# Patient Record
Sex: Female | Born: 1945 | Race: White | Hispanic: No | Marital: Married | Smoking: Never smoker
Health system: Southern US, Community
[De-identification: ages and names within clinical notes are randomized; demographics above are authoritative.]

## PROBLEM LIST (undated history)

## (undated) DIAGNOSIS — J309 Allergic rhinitis, unspecified: Secondary | ICD-10-CM

## (undated) DIAGNOSIS — IMO0001 Reserved for inherently not codable concepts without codable children: Secondary | ICD-10-CM

## (undated) DIAGNOSIS — J4489 Other specified chronic obstructive pulmonary disease: Secondary | ICD-10-CM

## (undated) DIAGNOSIS — I2510 Atherosclerotic heart disease of native coronary artery without angina pectoris: Secondary | ICD-10-CM

## (undated) DIAGNOSIS — G589 Mononeuropathy, unspecified: Secondary | ICD-10-CM

## (undated) DIAGNOSIS — J449 Chronic obstructive pulmonary disease, unspecified: Secondary | ICD-10-CM

## (undated) DIAGNOSIS — H409 Unspecified glaucoma: Secondary | ICD-10-CM

## (undated) DIAGNOSIS — G479 Sleep disorder, unspecified: Secondary | ICD-10-CM

## (undated) DIAGNOSIS — I739 Peripheral vascular disease, unspecified: Secondary | ICD-10-CM

## (undated) DIAGNOSIS — I059 Rheumatic mitral valve disease, unspecified: Secondary | ICD-10-CM

## (undated) HISTORY — DX: Mononeuropathy, unspecified: G58.9

## (undated) HISTORY — DX: Allergic rhinitis, unspecified: J30.9

## (undated) HISTORY — PX: ROTATOR CUFF REPAIR: SHX139

## (undated) HISTORY — DX: Peripheral vascular disease, unspecified: I73.9

## (undated) HISTORY — DX: Unspecified glaucoma: H40.9

## (undated) HISTORY — DX: Rheumatic mitral valve disease, unspecified: I05.9

## (undated) HISTORY — DX: Reserved for inherently not codable concepts without codable children: IMO0001

## (undated) HISTORY — DX: Other specified chronic obstructive pulmonary disease: J44.89

## (undated) HISTORY — DX: Sleep disorder, unspecified: G47.9

## (undated) HISTORY — DX: Chronic obstructive pulmonary disease, unspecified: J44.9

## (undated) HISTORY — DX: Atherosclerotic heart disease of native coronary artery without angina pectoris: I25.10

---

## 2000-04-17 ENCOUNTER — Encounter: Payer: Self-pay | Admitting: Orthopaedic Surgery

## 2000-04-20 ENCOUNTER — Inpatient Hospital Stay (HOSPITAL_COMMUNITY): Admission: RE | Admit: 2000-04-20 | Discharge: 2000-04-22 | Payer: Self-pay | Admitting: Orthopaedic Surgery

## 2004-03-15 ENCOUNTER — Encounter: Admission: RE | Admit: 2004-03-15 | Discharge: 2004-03-15 | Payer: Self-pay | Admitting: Orthopaedic Surgery

## 2004-07-02 ENCOUNTER — Ambulatory Visit: Payer: Self-pay | Admitting: Family Medicine

## 2004-07-27 ENCOUNTER — Ambulatory Visit: Payer: Self-pay | Admitting: Family Medicine

## 2004-10-13 ENCOUNTER — Ambulatory Visit: Payer: Self-pay | Admitting: Internal Medicine

## 2004-11-01 ENCOUNTER — Ambulatory Visit: Payer: Self-pay | Admitting: Internal Medicine

## 2004-11-01 ENCOUNTER — Ambulatory Visit (HOSPITAL_BASED_OUTPATIENT_CLINIC_OR_DEPARTMENT_OTHER): Admission: RE | Admit: 2004-11-01 | Discharge: 2004-11-01 | Payer: Self-pay | Admitting: Internal Medicine

## 2004-11-07 ENCOUNTER — Emergency Department (HOSPITAL_COMMUNITY): Admission: EM | Admit: 2004-11-07 | Discharge: 2004-11-07 | Payer: Self-pay | Admitting: *Deleted

## 2004-11-15 ENCOUNTER — Encounter: Payer: Self-pay | Admitting: Internal Medicine

## 2004-11-17 ENCOUNTER — Ambulatory Visit: Payer: Self-pay | Admitting: Internal Medicine

## 2004-12-16 ENCOUNTER — Ambulatory Visit: Payer: Self-pay | Admitting: Internal Medicine

## 2004-12-24 ENCOUNTER — Ambulatory Visit: Payer: Self-pay | Admitting: Family Medicine

## 2005-03-16 ENCOUNTER — Ambulatory Visit: Payer: Self-pay | Admitting: Internal Medicine

## 2005-04-08 ENCOUNTER — Ambulatory Visit: Payer: Self-pay | Admitting: Internal Medicine

## 2005-05-11 ENCOUNTER — Ambulatory Visit: Payer: Self-pay | Admitting: Internal Medicine

## 2005-05-12 ENCOUNTER — Ambulatory Visit: Payer: Self-pay | Admitting: Family Medicine

## 2005-06-10 ENCOUNTER — Ambulatory Visit: Payer: Self-pay | Admitting: Internal Medicine

## 2005-06-17 ENCOUNTER — Ambulatory Visit: Payer: Self-pay | Admitting: Family Medicine

## 2005-07-06 ENCOUNTER — Ambulatory Visit: Payer: Self-pay | Admitting: Internal Medicine

## 2005-08-09 ENCOUNTER — Ambulatory Visit: Payer: Self-pay | Admitting: Internal Medicine

## 2005-09-09 ENCOUNTER — Ambulatory Visit: Payer: Self-pay | Admitting: Internal Medicine

## 2005-09-15 ENCOUNTER — Ambulatory Visit: Payer: Self-pay | Admitting: Family Medicine

## 2005-09-28 ENCOUNTER — Ambulatory Visit: Payer: Self-pay | Admitting: Family Medicine

## 2005-10-07 ENCOUNTER — Ambulatory Visit: Payer: Self-pay | Admitting: Internal Medicine

## 2005-10-20 ENCOUNTER — Ambulatory Visit: Payer: Self-pay | Admitting: Family Medicine

## 2005-11-08 ENCOUNTER — Ambulatory Visit: Payer: Self-pay | Admitting: Internal Medicine

## 2005-12-06 ENCOUNTER — Ambulatory Visit: Payer: Self-pay | Admitting: Internal Medicine

## 2006-01-03 ENCOUNTER — Ambulatory Visit: Payer: Self-pay | Admitting: Internal Medicine

## 2006-02-06 ENCOUNTER — Ambulatory Visit: Payer: Self-pay | Admitting: Internal Medicine

## 2006-03-08 ENCOUNTER — Ambulatory Visit: Payer: Self-pay | Admitting: Internal Medicine

## 2006-04-06 ENCOUNTER — Ambulatory Visit: Payer: Self-pay | Admitting: Internal Medicine

## 2006-05-08 ENCOUNTER — Ambulatory Visit: Payer: Self-pay | Admitting: Internal Medicine

## 2006-06-13 ENCOUNTER — Ambulatory Visit: Payer: Self-pay | Admitting: Internal Medicine

## 2006-07-11 ENCOUNTER — Ambulatory Visit: Payer: Self-pay | Admitting: Internal Medicine

## 2006-08-03 ENCOUNTER — Ambulatory Visit: Payer: Self-pay | Admitting: Internal Medicine

## 2006-09-05 ENCOUNTER — Ambulatory Visit: Payer: Self-pay | Admitting: Internal Medicine

## 2006-10-04 ENCOUNTER — Ambulatory Visit: Payer: Self-pay | Admitting: Internal Medicine

## 2006-11-03 ENCOUNTER — Ambulatory Visit: Payer: Self-pay | Admitting: Internal Medicine

## 2006-12-01 ENCOUNTER — Ambulatory Visit: Payer: Self-pay | Admitting: Internal Medicine

## 2007-01-03 ENCOUNTER — Ambulatory Visit: Payer: Self-pay | Admitting: Internal Medicine

## 2007-01-31 ENCOUNTER — Ambulatory Visit: Payer: Self-pay | Admitting: Internal Medicine

## 2007-03-01 ENCOUNTER — Ambulatory Visit: Payer: Self-pay | Admitting: Internal Medicine

## 2007-04-09 ENCOUNTER — Ambulatory Visit: Payer: Self-pay | Admitting: Internal Medicine

## 2007-05-14 ENCOUNTER — Ambulatory Visit: Payer: Self-pay | Admitting: Internal Medicine

## 2007-06-04 DIAGNOSIS — I059 Rheumatic mitral valve disease, unspecified: Secondary | ICD-10-CM | POA: Insufficient documentation

## 2007-06-04 DIAGNOSIS — R490 Dysphonia: Secondary | ICD-10-CM | POA: Insufficient documentation

## 2007-06-04 DIAGNOSIS — G589 Mononeuropathy, unspecified: Secondary | ICD-10-CM

## 2007-06-04 DIAGNOSIS — J302 Other seasonal allergic rhinitis: Secondary | ICD-10-CM

## 2007-06-04 DIAGNOSIS — J3089 Other allergic rhinitis: Secondary | ICD-10-CM | POA: Insufficient documentation

## 2007-06-04 DIAGNOSIS — G4733 Obstructive sleep apnea (adult) (pediatric): Secondary | ICD-10-CM | POA: Insufficient documentation

## 2007-06-04 DIAGNOSIS — H409 Unspecified glaucoma: Secondary | ICD-10-CM | POA: Insufficient documentation

## 2007-06-04 DIAGNOSIS — IMO0001 Reserved for inherently not codable concepts without codable children: Secondary | ICD-10-CM | POA: Insufficient documentation

## 2007-06-04 HISTORY — DX: Mononeuropathy, unspecified: G58.9

## 2007-06-04 HISTORY — DX: Other seasonal allergic rhinitis: J30.2

## 2007-06-04 HISTORY — DX: Rheumatic mitral valve disease, unspecified: I05.9

## 2007-06-04 HISTORY — DX: Unspecified glaucoma: H40.9

## 2007-06-04 HISTORY — DX: Dysphonia: R49.0

## 2007-06-13 ENCOUNTER — Ambulatory Visit: Payer: Self-pay | Admitting: Internal Medicine

## 2007-07-16 ENCOUNTER — Ambulatory Visit: Payer: Self-pay | Admitting: Internal Medicine

## 2007-08-08 ENCOUNTER — Ambulatory Visit: Payer: Self-pay | Admitting: Internal Medicine

## 2007-08-08 DIAGNOSIS — H65199 Other acute nonsuppurative otitis media, unspecified ear: Secondary | ICD-10-CM

## 2007-08-08 DIAGNOSIS — H65119 Acute and subacute allergic otitis media (mucoid) (sanguinous) (serous), unspecified ear: Secondary | ICD-10-CM | POA: Insufficient documentation

## 2007-08-08 HISTORY — DX: Other acute nonsuppurative otitis media, unspecified ear: H65.199

## 2007-09-10 ENCOUNTER — Ambulatory Visit: Payer: Self-pay | Admitting: Internal Medicine

## 2007-10-12 ENCOUNTER — Ambulatory Visit: Payer: Self-pay | Admitting: Internal Medicine

## 2007-11-08 ENCOUNTER — Ambulatory Visit: Payer: Self-pay | Admitting: Internal Medicine

## 2007-12-05 ENCOUNTER — Ambulatory Visit: Payer: Self-pay | Admitting: Internal Medicine

## 2008-01-04 ENCOUNTER — Ambulatory Visit: Payer: Self-pay | Admitting: Internal Medicine

## 2008-02-04 ENCOUNTER — Ambulatory Visit: Payer: Self-pay | Admitting: Internal Medicine

## 2008-03-04 ENCOUNTER — Ambulatory Visit: Payer: Self-pay | Admitting: Internal Medicine

## 2008-04-02 ENCOUNTER — Ambulatory Visit: Payer: Self-pay | Admitting: Internal Medicine

## 2008-04-10 ENCOUNTER — Ambulatory Visit: Payer: Self-pay | Admitting: Internal Medicine

## 2008-05-08 ENCOUNTER — Ambulatory Visit: Payer: Self-pay | Admitting: Internal Medicine

## 2008-05-21 ENCOUNTER — Ambulatory Visit: Payer: Self-pay | Admitting: Internal Medicine

## 2008-06-10 ENCOUNTER — Ambulatory Visit: Payer: Self-pay | Admitting: Internal Medicine

## 2008-07-09 ENCOUNTER — Ambulatory Visit: Payer: Self-pay | Admitting: Internal Medicine

## 2008-07-11 ENCOUNTER — Telehealth (INDEPENDENT_AMBULATORY_CARE_PROVIDER_SITE_OTHER): Payer: Self-pay | Admitting: *Deleted

## 2008-08-13 ENCOUNTER — Ambulatory Visit: Payer: Self-pay | Admitting: Internal Medicine

## 2008-08-13 DIAGNOSIS — I2510 Atherosclerotic heart disease of native coronary artery without angina pectoris: Secondary | ICD-10-CM | POA: Insufficient documentation

## 2008-08-13 DIAGNOSIS — I739 Peripheral vascular disease, unspecified: Secondary | ICD-10-CM | POA: Insufficient documentation

## 2008-08-13 HISTORY — DX: Atherosclerotic heart disease of native coronary artery without angina pectoris: I25.10

## 2008-09-09 ENCOUNTER — Ambulatory Visit: Payer: Self-pay | Admitting: Internal Medicine

## 2008-10-09 ENCOUNTER — Ambulatory Visit: Payer: Self-pay | Admitting: Internal Medicine

## 2008-11-06 ENCOUNTER — Ambulatory Visit: Payer: Self-pay | Admitting: Internal Medicine

## 2008-12-11 ENCOUNTER — Ambulatory Visit: Payer: Self-pay | Admitting: Internal Medicine

## 2009-01-08 ENCOUNTER — Ambulatory Visit: Payer: Self-pay | Admitting: Internal Medicine

## 2009-02-09 ENCOUNTER — Ambulatory Visit: Payer: Self-pay | Admitting: Internal Medicine

## 2009-02-13 ENCOUNTER — Ambulatory Visit: Payer: Self-pay | Admitting: Internal Medicine

## 2009-03-09 ENCOUNTER — Ambulatory Visit: Payer: Self-pay | Admitting: Internal Medicine

## 2009-04-09 ENCOUNTER — Telehealth: Payer: Self-pay | Admitting: Internal Medicine

## 2009-04-09 ENCOUNTER — Ambulatory Visit: Payer: Self-pay | Admitting: Internal Medicine

## 2009-05-12 ENCOUNTER — Ambulatory Visit: Payer: Self-pay | Admitting: Internal Medicine

## 2009-06-11 ENCOUNTER — Ambulatory Visit: Payer: Self-pay | Admitting: Internal Medicine

## 2009-07-13 ENCOUNTER — Ambulatory Visit: Payer: Self-pay | Admitting: Internal Medicine

## 2009-08-13 ENCOUNTER — Ambulatory Visit: Payer: Self-pay | Admitting: Internal Medicine

## 2009-09-14 ENCOUNTER — Ambulatory Visit: Payer: Self-pay | Admitting: Internal Medicine

## 2009-10-14 ENCOUNTER — Ambulatory Visit: Payer: Self-pay | Admitting: Internal Medicine

## 2009-11-13 ENCOUNTER — Ambulatory Visit: Payer: Self-pay | Admitting: Internal Medicine

## 2009-12-14 ENCOUNTER — Ambulatory Visit: Payer: Self-pay | Admitting: Internal Medicine

## 2009-12-17 ENCOUNTER — Encounter: Payer: Self-pay | Admitting: Internal Medicine

## 2009-12-18 ENCOUNTER — Encounter: Payer: Self-pay | Admitting: Internal Medicine

## 2010-01-07 ENCOUNTER — Ambulatory Visit: Payer: Self-pay | Admitting: Internal Medicine

## 2010-01-13 ENCOUNTER — Ambulatory Visit: Payer: Self-pay | Admitting: Internal Medicine

## 2010-01-28 ENCOUNTER — Ambulatory Visit: Payer: Self-pay | Admitting: Internal Medicine

## 2010-02-12 ENCOUNTER — Ambulatory Visit: Payer: Self-pay | Admitting: Internal Medicine

## 2010-02-12 DIAGNOSIS — J45909 Unspecified asthma, uncomplicated: Secondary | ICD-10-CM | POA: Insufficient documentation

## 2010-02-12 DIAGNOSIS — J4540 Moderate persistent asthma, uncomplicated: Secondary | ICD-10-CM | POA: Insufficient documentation

## 2010-02-12 HISTORY — DX: Moderate persistent asthma, uncomplicated: J45.40

## 2010-03-16 ENCOUNTER — Ambulatory Visit: Payer: Self-pay | Admitting: Internal Medicine

## 2010-04-19 ENCOUNTER — Ambulatory Visit: Payer: Self-pay | Admitting: Internal Medicine

## 2010-05-21 ENCOUNTER — Ambulatory Visit: Payer: Self-pay | Admitting: Internal Medicine

## 2010-06-04 ENCOUNTER — Encounter: Admission: RE | Admit: 2010-06-04 | Discharge: 2010-06-04 | Payer: Self-pay | Admitting: Orthopaedic Surgery

## 2010-06-07 ENCOUNTER — Ambulatory Visit: Payer: Self-pay | Admitting: Internal Medicine

## 2010-06-08 ENCOUNTER — Telehealth: Payer: Self-pay | Admitting: Internal Medicine

## 2010-06-10 ENCOUNTER — Ambulatory Visit: Payer: Self-pay | Admitting: Internal Medicine

## 2010-06-14 ENCOUNTER — Telehealth (INDEPENDENT_AMBULATORY_CARE_PROVIDER_SITE_OTHER): Payer: Self-pay | Admitting: *Deleted

## 2010-06-18 ENCOUNTER — Ambulatory Visit: Payer: Self-pay | Admitting: Internal Medicine

## 2010-07-13 ENCOUNTER — Telehealth: Payer: Self-pay | Admitting: Internal Medicine

## 2010-07-19 ENCOUNTER — Ambulatory Visit: Payer: Self-pay | Admitting: Internal Medicine

## 2010-07-23 ENCOUNTER — Telehealth: Payer: Self-pay | Admitting: Internal Medicine

## 2010-08-19 ENCOUNTER — Ambulatory Visit
Admission: RE | Admit: 2010-08-19 | Discharge: 2010-08-19 | Payer: Self-pay | Source: Home / Self Care | Attending: Internal Medicine | Admitting: Internal Medicine

## 2010-09-20 ENCOUNTER — Ambulatory Visit
Admission: RE | Admit: 2010-09-20 | Discharge: 2010-09-20 | Payer: Self-pay | Source: Home / Self Care | Attending: Internal Medicine | Admitting: Internal Medicine

## 2010-09-23 NOTE — Assessment & Plan Note (Signed)
Summary: xolair/apc   Allergies: 1)  ! Pcn 2)  ! Iodine 3)  ! Ceclor 4)  ! * Singulair 5)  ! * Sulfa Drugs 6)  ! Zithromax   Other Orders: Admin of Therapeutic Inj  intramuscular or subcutaneous (81191) Xolair (omalizumab) 150mg  (Y7829)   Medication Administration  Injection # 1:    Medication: Xolair (omalizumab) 150mg     Diagnosis: EXTRINSIC ASTHMA, UNSPECIFIED (ICD-493.00)    Route: SQ    Site: R deltoid    Exp Date: 05/2012    Lot #: [id number]    Mfr: Mendel Ryder    Comments: Injection given by Dimas Millin in allergy lab. Xolair 150mg . 1.45ml x 1 in Right Deltoid. Pt waited 30 minutes.     Patient tolerated injection without complications  Orders Added: 1)  Admin of Therapeutic Inj  intramuscular or subcutaneous [96372] 2)  Xolair (omalizumab) 150mg  [J2357]

## 2010-09-23 NOTE — Assessment & Plan Note (Signed)
Summary: xolair/apc  Nurse Visit   Allergies: 1)  ! Pcn 2)  ! Iodine 3)  ! Ceclor 4)  ! * Singulair 5)  ! * Sulfa Drugs 6)  ! Zithromax  Medication Administration  Injection # 1:    Medication: Xolair (omalizumab) 150mg     Diagnosis: EXTRINSIC ASTHMA, UNSPECIFIED (ICD-493.00)    Route: SQ    Site: L deltoid    Exp Date: 12/20/2012    Lot #: [id number]    Mfr: Mendel Ryder    Comments: 1.2 ML IN LEFT ARM PT WAITED 30 MIN     Patient tolerated injection without complications    Given by: TAMMY SCOTT IN ALLERGY LAB  Orders Added: 1)  Xolair (omalizumab) 150mg  [J2357] 2)  Administration xolair injection [id number]   Medication Administration  Injection # 1:    Medication: Xolair (omalizumab) 150mg     Diagnosis: EXTRINSIC ASTHMA, UNSPECIFIED (ICD-493.00)    Route: SQ    Site: L deltoid    Exp Date: 12/20/2012    Lot #: [id number]    Mfr: Mendel Ryder    Comments: 1.2 ML IN LEFT ARM PT WAITED 30 MIN     Patient tolerated injection without complications    Given by: TAMMY SCOTT IN ALLERGY LAB  Orders Added: 1)  Xolair (omalizumab) 150mg  [J2357] 2)  Administration xolair injection [81191]

## 2010-09-23 NOTE — Assessment & Plan Note (Signed)
Summary: xolair/ mbw      Current Allergies: ! PCN ! IODINE ! CECLOR ! * SINGULAIR ! * SULFA DRUGS          ]  Medication Administration  Injection # 1:    Medication: Xolair (omalizumab) 150mg     Diagnosis: EXTRINSIC ASTHMA, UNSPECIFIED (ICD-493.00)    Route: SQ    Site: R deltoid    Exp Date: 09/2011    Lot #: [id number]    Mfr: Mendel Ryder    Comments: Injection given by Dimas Millin in allergy lab. Xolair 150mg . 1.48ml x 1 in Right Deltoid. Pt waited 30 minutes.   Orders Added: 1)  Admin of Therapeutic Inj  intramuscular or subcutaneous [96372] 2)  Xolair (omalizumab) 150mg  [J2357]

## 2010-09-23 NOTE — Assessment & Plan Note (Signed)
Summary: Debra Byrd MBW      Current Allergies: ! PCN ! IODINE ! CECLOR ! * SINGULAIR ! * SULFA DRUGS        Complete Medication List: 1)  Advair Diskus 500-50 Mcg/dose Misc (Fluticasone-salmeterol) .Marland Kitchen.. 1 puff two times a day 2)  Claritin 10 Mg Tabs (Loratadine) .Marland Kitchen.. 1 by mouth once daily 3)  Benicar 40 Mg Tabs (Olmesartan medoxomil) .Marland Kitchen.. 1 by mouth once daily 4)  Fish Oil 1000 Mg Caps (Omega-3 fatty acids) .... 4 by mouth once daily 5)  Calcium 500 500 Mg Tabs (Calcium carbonate) .Marland Kitchen.. 1 by mouth two times a day 6)  Cvs Daily Multiple Tabs (Multiple vitamin) .Marland Kitchen.. 1 by mouth once daily 7)  Flonase 50 Mcg/act Susp (Fluticasone propionate) .... 2 sprays each nostril once daily 8)  Crestor 5 Mg Tabs (Rosuvastatin calcium) .... 1/2 every other day 9)  Xolair 150 Mg Solr (Omalizumab) .... Every 4 weeks 10)  Cvs Vitamin B-6 100 Mg Tabs (Pyridoxine hcl) .Marland Kitchen.. 1 by mouth once daily 11)  Nexium 40 Mg Cpdr (Esomeprazole magnesium) .Marland Kitchen.. 1 by mouth once daily 12)  Baby Aspirin 81 Mg Chew (Aspirin) .... Take 1 tablet by mouth two times a day 13)  Albuterol 90 Mcg/act Aers (Albuterol) .... 2 puffs qid prn 14)  Prednisone Taper  .... Use as directed 15)  Temazepam 15 Mg Caps (Temazepam) .... Take 1 capsule by mouth at bedtime as needed 16)  Proventil Inhaler  .... Use as directed 17)  Twin Jet-epinephrine  .... Use as directed 18)  Astelin 137 Mcg/spray Soln (Azelastine hcl) .... Take 2 sprays in each nostril twice a day 19)  Albuterol-ipratropium 2.5-0.5 Mg/32ml Soln (Albuterol-ipratropium) .Marland Kitchen.. 1 4 times daily as needed     ]  Medication Administration  Injection # 1:    Medication: Xolair (omalizumab) 150mg     Diagnosis: EXTRINSIC ASTHMA, UNSPECIFIED (ICD-493.00)    Route: SQ    Site: R deltoid    Exp Date: 02/2011    Lot #: [id number]    Mfr: Mendel Ryder    Comments: Injection given by Dimas Millin inallergy lab. Xoalir 150mg . 1.71ml x 1 in Right Deltoid. Pt waited 30 minutes.   Orders Added: 1)  Admin of Therapeutic Inj  intramuscular or subcutaneous [96372] 2)  Xolair (omalizumab) 150mg  [J2357]

## 2010-09-23 NOTE — Progress Notes (Signed)
Summary: Documentation- Pneumovax booster given 11/25/00  Phone Note Other Incoming   Summary of Call: Pneumovax booster given 11/25/00 Initial call taken by: Waymon Budge MD,  July 13, 2010 1:08 PM

## 2010-09-23 NOTE — Assessment & Plan Note (Signed)
Summary: xolair/apc  Nurse Visit   Allergies: 1)  ! Pcn 2)  ! Iodine 3)  ! Ceclor 4)  ! * Singulair 5)  ! * Sulfa Drugs 6)  ! Zithromax  Medication Administration  Injection # 1:    Medication: Xolair (omalizumab) 150mg     Diagnosis: EXTRINSIC ASTHMA, UNSPECIFIED (ICD-493.00)    Route: SQ    Site: R deltoid    Exp Date: 10/22/2012    Lot #: [id number]    Mfr: genentech    Comments: 1.2 ml in right arm pt waited 20 mins    Patient tolerated injection without complications    Given by: susanne ford in allergy clinic  Orders Added: 1)  Xolair (omalizumab) 150mg  [J2357] 2)  Administration xolair injection [[id number]]   Medication Administration  Injection # 1:    Medication: Xolair (omalizumab) 150mg     Diagnosis: EXTRINSIC ASTHMA, UNSPECIFIED (ICD-493.00)    Route: SQ    Site: R deltoid    Exp Date: 10/22/2012    Lot #: [id number]    Mfr: genentech    Comments: 1.2 ml in right arm pt waited 20 mins    Patient tolerated injection without complications    Given by: susanne ford in allergy clinic  Orders Added: 1)  Xolair (omalizumab) 150mg  [J2357] 2)  Administration xolair injection [[id number]]

## 2010-09-23 NOTE — Assessment & Plan Note (Signed)
Summary: xolair//mbw      Current Allergies: ! PCN ! IODINE ! CECLOR ! * SINGULAIR ! * SULFA DRUGS            Medication Administration  Injection # 1:    Medication: Xolair (omalizumab) 150mg     Diagnosis: EXTRINSIC ASTHMA, UNSPECIFIED (ICD-493.00)    Route: SQ    Site: R deltoid    Exp Date: 11/2011    Lot #: [id number]    Mfr: Mendel Ryder    Comments: Injection given by Dimas Millin in allergy lab. Xolair 150mg . 1.42ml x 1 in Left Deltoid. Pt waited 30 minutes.   Orders Added: 1)  Admin of Therapeutic Inj  intramuscular or subcutaneous [96372] 2)  Xolair (omalizumab) 150mg  [J2357]

## 2010-09-23 NOTE — Assessment & Plan Note (Signed)
Summary: Geoffry Paradise ///kp  Nurse Visit   Allergies: 1)  ! Pcn 2)  ! Iodine 3)  ! Ceclor 4)  ! * Singulair 5)  ! * Sulfa Drugs 6)  ! Zithromax  Medication Administration  Injection # 1:    Medication: Xolair (omalizumab) 150mg     Diagnosis: EXTRINSIC ASTHMA, UNSPECIFIED (ICD-493.00)    Route: IM    Site: R deltoid    Exp Date: 05/22/2013    Lot #: [id number]    Mfr: [employer]    Comments: xolair 150 mg, units 30, 1.2 ml in left deltoid, Pt waited 30 .    Given by: Evern Bio, allergy tech  Orders Added: 1)  Xolair (omalizumab) 150mg  [J2357] 2)  Administration xolair injection [[id number]]   Medication Administration  Injection # 1:    Medication: Xolair (omalizumab) 150mg     Diagnosis: EXTRINSIC ASTHMA, UNSPECIFIED (ICD-493.00)    Route: IM    Site: R deltoid    Exp Date: 05/22/2013    Lot #: [id number]    Mfr: [employer]    Comments: xolair 150 mg, units 30, 1.2 ml in left deltoid, Pt waited 30 .    Given by: Evern Bio, allergy tech  Orders Added: 1)  Xolair (omalizumab) 150mg  [J2357] 2)  Administration xolair injection [phone]

## 2010-09-23 NOTE — Assessment & Plan Note (Signed)
Summary: 4 months/apc   Primary Provider/Referring Provider:  Benedetto Goad  CC:  4 month follow up visit-asthma and allergies; still using Advair 100/50.Marland Kitchen  History of Present Illness: Jan 07, 2010- Asthma/ steroid dep/ Xolair, COPD, allergic rhinitis, VCD She is feeling pretty well with no new concerns. She credits Xolair for getting through allergy season but did have increased wheeze, sneeze without need for steroids or ER visit.. She is now geting better as season progresses. Last prednisone was in December and February. We reviewed meds. Saw her ENT for sore throat, with laryngoscopy. Throat is still sore intermittently. Right side of throat still feels sore with cold drink. Swallows ok and occasionally hoarse chronically without change.. She is worried because father had laryngeal cancer. She asks about whether she has COPD and we agreed to update PFT. There is hx of PHTN noted on echo years ago after diet drug. She will ask her cardiologist about this.  February 12, 2010- Asthma/ steroid dependent/ Xolair,  allergic rhinitis, VCD Heat makes her tired but otw "ok". No recent need for steroids or hospital. Uses rescue inhaler often at bedtime for prevention, using it 5/7 days at least once, but sometimes just one puff. PFT- mild/ mod obst, w/ response to dilator. FEV1/ FVC 0.72. CXR- WNL/ NAD 6 MWT- 97%, 98%, 96%, 384 meters.  June 10, 2010- Asthma/ steroid dependent/ Xolair,  allergic rhinitis, VCD Nurse-CC: 4 month follow up visit-asthma and allergies; still using Advair 100/50. She called recently, worried facing rotator cuff surgery. With Xolair she had done so well that she has dropped off Advair. She finds she is wanting to use the rescue inhaler 2-3 x/ week for some chest tightness w/o wheeze. She uses it before walking, but can't sort out what is just deconditioning. When she called we had her restart on Advair, reduced to 100/50 to maximize preoperative stability. No prednisone in  several months.  Had flu vax with small local reaction.    Asthma History    Asthma Control Assessment:    Age range: 12+ years    Symptoms: 0-2 days/week    Nighttime Awakenings: 0-2/month    Interferes w/ normal activity: no limitations    SABA use (not for EIB): 0-2 days/week    Asthma Control Assessment: Well Controlled   Preventive Screening-Counseling & Management  Alcohol-Tobacco     Smoking Status: never     Tobacco Counseling: to remain off tobacco products  Current Medications (verified): 1)  Advair Diskus 100-50 Mcg/dose Aepb (Fluticasone-Salmeterol) .Marland Kitchen.. 1 Puff and Rinse Mouth, Twice Daily 2)  Claritin 10 Mg  Tabs (Loratadine) .Marland Kitchen.. 1 By Mouth Once Daily 3)  Cozaar .... Take 1 Tablet By Mouth Once A Day 4)  Fish Oil 1000 Mg  Caps (Omega-3 Fatty Acids) .... 4 By Mouth Once Daily 5)  Calcium 500 500 Mg  Tabs (Calcium Carbonate) .Marland Kitchen.. 1 By Mouth Two Times A Day 6)  Cvs Daily Multiple   Tabs (Multiple Vitamin) .Marland Kitchen.. 1 By Mouth Once Daily 7)  Flonase 50 Mcg/act  Susp (Fluticasone Propionate) .... 2 Sprays Each Nostril Once Daily 8)  Crestor 5 Mg  Tabs (Rosuvastatin Calcium) .... 1/2 Every Other Day 9)  Xolair 150 Mg  Solr (Omalizumab) .... Every 4 Weeks 10)  Cvs Vitamin B-6 100 Mg  Tabs (Pyridoxine Hcl) .Marland Kitchen.. 1 By Mouth Once Daily 11)  Dexilant 60 Mg Cpdr (Dexlansoprazole) .... Take 1 By Mouth Once Daily 12)  Ventolin Hfa 108 (90 Base) Mcg/act Aers (Albuterol Sulfate) .... 2  Puffs Every 4 Hours As Needed 13)  Epinephrine 0.3 Mg/0.39ml Devi (Epinephrine) .... Use As Directed As Needed Severe Allergic Reaction 14)  Astelin 137 Mcg/spray  Soln (Azelastine Hcl) .... Take 2 Sprays in Each Nostril Twice A Day 15)  Albuterol-Ipratropium 2.5-0.5 Mg/27ml  Soln (Albuterol-Ipratropium) .Marland Kitchen.. 1 4 Times Daily As Needed 16)  Plavix 75 Mg Tabs (Clopidogrel Bisulfate) .... Take 1 By Mouth Once Daily 17)  Patanol 0.1 % Soln (Olopatadine Hcl) .Marland Kitchen.. 1 Drop Each Eye Two Times A Day  As  Needed  Allergies (verified): 1)  ! Pcn 2)  ! Iodine 3)  ! Ceclor 4)  ! * Singulair 5)  ! * Sulfa Drugs 6)  ! Zithromax 7)  ! Levaquin  Past History:  Past Medical History: Last updated: 02/13/2009  PERIPHERAL VASCULAR DISEASE (ICD-443.9) TIA- 2/10 CAD (ICD-414.00) ACUTE MUCOID OTITIS MEDIA (ICD-381.02) GLAUCOMA (ICD-365.9) NEUROPATHY (ICD-355.9) SLEEP DISORDER (ICD-780.50) FIBROMYALGIA (ICD-729.1) MITRAL VALVE PROLAPSE (ICD-424.0) VOCAL CORD DISORDER (ICD-478.5) COPD (ICD-496) EXTRINSIC ASTHMA, UNSPECIFIED (ICD-493.00) ALLERGIC RHINITIS (ICD-477.9) Glaucoma- intolerant of Spiriva  Past Surgical History: Last updated: 04/10/2008 Rotator cuff- right C- sectionx3  Family History: Last updated: 01/31/10 Father- died heart disease and laryngeal cancer Sister- died multiple myeloma Mtoher had mild asthma Uncles- died emphysema daughter- asthma  Social History: Last updated: 08/13/2008 Patient never smoked.  Married  Risk Factors: Smoking Status: never (06/10/2010)  Review of Systems      See HPI       The patient complains of shortness of breath with activity.  The patient denies shortness of breath at rest, productive cough, non-productive cough, coughing up blood, chest pain, irregular heartbeats, acid heartburn, indigestion, loss of appetite, weight change, abdominal pain, difficulty swallowing, sore throat, tooth/dental problems, headaches, nasal congestion/difficulty breathing through nose, sneezing, itching, change in color of mucus, and fever.    Vital Signs:  Patient profile:   65 year old female Height:      62 inches Weight:      159 pounds BMI:     29.19 O2 Sat:      100 % on Room air Pulse rate:   58 / minute BP sitting:   132 / 78  (left arm) Cuff size:   regular  Vitals Entered By: Reynaldo Minium CMA (June 10, 2010 10:12 AM)  O2 Flow:  Room air CC: 4 month follow up visit-asthma and allergies; still using Advair 100/50.   Physical  Exam  Additional Exam:  General: A/Ox3; pleasant and cooperative, NAD,  SKIN: no rash, lesions NODES: no lymphadenopathy HEENT: [place]/AT, EOM- WNL, Conjuctivae- clear, PERRLA, periorbital edema without conjunctival injection TM-WNL, Nose- little mucus, Throat- clear and wnl, Mallampati II-III NECK: Supple w/ fair ROM, JVD- none, normal carotid impulses w/o bruits Thyroid-  CHEST: Clear to P&A, talkative and unalbored HEART: RRR, no m/g/r heard ABDOMEN: overweight UEA:VWUJ, nl pulses, no edema  NEURO: Grossly intact to observation      Impression & Recommendations:  Problem # 1:  EXTRINSIC ASTHMA, UNSPECIFIED (ICD-493.00) Severe asthma, which has been steroid dependent in the past,  now under much better control. We discussed timing of her Xolair and use of her meds  between now and her surgery. I think she is able to go through the surgery ok.  I explained that we can't promise there will be no complications, but she is in the best shape in years, so if she is going to get surgery it would make sense to go ahead and get it done. Marland Kitchen She  feels better back on Advair 100.   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 It helps that she is not on an ACE inhibitor, and her reflux is better controlled.   Medications Added to Medication List This Visit: 1)  Dexilant 60 Mg Cpdr (Dexlansoprazole) .... Take 1 by mouth once daily  Other Orders: Est. Patient Level IV (69629)  Patient Instructions: 1)  We will pull paper chart to check on when you had a pneumovax.  2)  cc Dr Benedetto Goad, Dr Cleophas Dunker 3)  Advair script for three months Prescriptions: ADVAIR DISKUS 100-50 MCG/DOSE AEPB (FLUTICASONE-SALMETEROL) 1 puff and rinse mouth, twice daily  #3 x 3   Entered and Authorized by:   Waymon Budge MD    Signed by:   Waymon Budge MD on 06/10/2010   Method used:   Print then Give to Patient   RxID:   [id number]

## 2010-09-23 NOTE — Assessment & Plan Note (Signed)
Summary: flu shot ///kp  Nurse Visit   Allergies: 1)  ! Pcn 2)  ! Iodine 3)  ! Ceclor 4)  ! * Singulair 5)  ! * Sulfa Drugs 6)  ! Zithromax  Orders Added: 1)  Admin 1st Vaccine [90471] 2)  Flu Vaccine 53yrs + [16109] Flu Vaccine Consent Questions     Do you have a history of severe allergic reactions to this vaccine? no    Any prior history of allergic reactions to egg and/or gelatin? no    Do you have a sensitivity to the preservative Thimersol? no    Do you have a past history of Guillan-Barre Syndrome? no    Do you currently have an acute febrile illness? no    Have you ever had a severe reaction to latex? no    Vaccine information given and explained to patient? yes    Are you currently pregnant? no    Lot Number:[id number]   Exp Date:02/19/2011   Site Given  Left Deltoid IM  Tammy Sejal Cofield  June 07, 2010 3:55 PM

## 2010-09-23 NOTE — Assessment & Plan Note (Signed)
Summary: xolair/ mbw  Nurse Visit   Allergies: 1)  ! Pcn 2)  ! Iodine 3)  ! Ceclor 4)  ! * Singulair 5)  ! * Sulfa Drugs 6)  ! Zithromax  Medication Administration  Injection # 1:    Medication: Xolair (omalizumab) 150mg     Diagnosis: 493.00    Route: SQ    Site: R deltoid    Exp Date: 02/2013    Lot #: [id number]    Mfr: Salome Spotted    Comments: 1.2 ml in right arm pt waited 20 mins charged j2357 and 04540    Given by: susanne ford in allergy lab   Medication Administration  Injection # 1:    Medication: Xolair (omalizumab) 150mg     Diagnosis: 493.00    Route: SQ    Site: R deltoid    Exp Date: 02/2013    Lot #: [id number]    Mfr: Salome Spotted    Comments: 1.2 ml in right arm pt waited 20 mins charged j2357 and 47829    Given by: susanne ford in allergy lab

## 2010-09-23 NOTE — Assessment & Plan Note (Signed)
Summary: Debra Byrd  Nurse Visit   Allergies: 1)  ! Pcn 2)  ! Iodine 3)  ! Ceclor 4)  ! * Singulair 5)  ! * Sulfa Drugs 6)  ! Zithromax 7)  ! Levaquin  Medication Administration  Injection # 1:    Medication: Xolair (omalizumab) 150mg     Diagnosis: EXTRINSIC ASTHMA, UNSPECIFIED (ICD-493.00)    Route: SQ    Site: R thigh    Exp Date: 08/2013    Lot #: [id number]    Mfr: [employer]    Comments: 1.2 ML IN RIGHT HIP 150MG  CHARGED [id number] AND 04540    Patient tolerated injection without complications    Given by: TAMMY SCOTT IN ALLERGY LAB  Orders Added: 1)  Xolair (omalizumab) 150mg  [J2357] 2)  Administration xolair injection [98119]   Medication Administration  Injection # 1:    Medication: Xolair (omalizumab) 150mg     Diagnosis: EXTRINSIC ASTHMA, UNSPECIFIED (ICD-493.00)    Route: SQ    Site: R thigh    Exp Date: 08/2013    Lot #: [id number]    Mfr: [employer]    Comments: 1.2 ML IN RIGHT HIP 150MG  CHARGED [id number] AND 56213    Patient tolerated injection without complications    Given by: TAMMY SCOTT IN ALLERGY LAB  Orders Added: 1)  Xolair (omalizumab) 150mg  [J2357] 2)  Administration xolair injection [08657]

## 2010-09-23 NOTE — Assessment & Plan Note (Signed)
Summary: xolair/ mbw  Nurse Visit   Allergies: 1)  ! Pcn 2)  ! Iodine 3)  ! Ceclor 4)  ! * Singulair 5)  ! * Sulfa Drugs 6)  ! Zithromax 7)  ! Levaquin  Medication Administration  Injection # 1:    Medication: Xolair (omalizumab) 150mg     Diagnosis: EXTRINSIC ASTHMA, UNSPECIFIED (ICD-493.00)    Route: IM    Site: R deltoid    Exp Date: 05/22/2013    Lot #: [id number]    Mfr: [employer]    Comments: xolair 150 mg, 30 units, 1.2 ml x Right deltoid. Pt waited 20 mins.    Given by: Drucie Opitz, CMA  Orders Added: 1)  Xolair (omalizumab) 150mg  [J2357] 2)  Administration xolair injection [id number]

## 2010-09-23 NOTE — Assessment & Plan Note (Signed)
Summary: xolair/apc   Allergies: 1)  ! Pcn 2)  ! Iodine 3)  ! Ceclor 4)  ! * Singulair 5)  ! * Sulfa Drugs   Other Orders: Xolair (omalizumab) 150mg  (S0630) Admin of Therapeutic Inj  intramuscular or subcutaneous (16010)   Medication Administration  Injection # 1:    Medication: Xolair (omalizumab) 150mg     Diagnosis: EXTRINSIC ASTHMA, UNSPECIFIED (ICD-493.00)    Route: SQ    Site: R deltoid    Exp Date: 04/23/2012    Lot #: [id number]    Mfr: [employer]entech    Comments: 1.2ML X 1 IN RIGHT ARM PT WAITED FOR 30 MINS    Patient tolerated injection without complications    Given by: Glade Lloyd IN ALLERGY LAB  Orders Added: 1)  Xolair (omalizumab) 150mg  [J2357] 2)  Admin of Therapeutic Inj  intramuscular or subcutaneous [73220]

## 2010-09-23 NOTE — Assessment & Plan Note (Signed)
Summary: xoliar/cb  Nurse Visit   Allergies: 1)  ! Pcn 2)  ! Iodine 3)  ! Ceclor 4)  ! * Singulair 5)  ! * Sulfa Drugs 6)  ! Zithromax 7)  ! Levaquin  Medication Administration  Injection # 1:    Medication: Xolair (omalizumab) 150mg     Diagnosis: EXTRINSIC ASTHMA, UNSPECIFIED (ICD-493.00)    Route: SQ    Site: L deltoid    Exp Date: 05/2013    Lot #: [id number]    Mfr: [employer]    Comments: 1.2 ML IN LEFT ARM  150MG  PT WAITED 20 MINS CHARGED J2357 AND 95621    Patient tolerated injection without complications    Given by: SUSANNE FORD IN ALLERGY LAB   Medication Administration  Injection # 1:    Medication: Xolair (omalizumab) 150mg     Diagnosis: EXTRINSIC ASTHMA, UNSPECIFIED (ICD-493.00)    Route: SQ    Site: L deltoid    Exp Date: 05/2013    Lot #: [id number]    Mfr: [employer]    Comments: 1.2 ML IN LEFT ARM  150MG  PT WAITED 20 MINS CHARGED J2357 AND 84696    Patient tolerated injection without complications    Given by: SUSANNE FORD IN ALLERGY LAB

## 2010-09-23 NOTE — Assessment & Plan Note (Signed)
Summary: xolair/ mbw      Current Allergies: ! PCN ! IODINE ! CECLOR ! * SINGULAIR ! * SULFA DRUGS          ]  Medication Administration  Injection # 1:    Medication: Xolair (omalizumab) 150mg     Diagnosis: EXTRINSIC ASTHMA, UNSPECIFIED (ICD-493.00)    Route: SQ    Site: L deltoid    Exp Date: 09/2011    Lot #: [id number]    Mfr: Mendel Ryder    Comments: Injection given by Dimas Millin in allergy lab. Xolair 150mg . 1.59ml x 1in in Left Deltoid. Pt waited 30 minutes.   Orders Added: 1)  Admin of Therapeutic Inj  intramuscular or subcutaneous [96372] 2)  Xolair (omalizumab) 150mg  [J2357]

## 2010-09-23 NOTE — Assessment & Plan Note (Signed)
Summary: Debra Byrd  Nurse Visit   Allergies: 1)  ! Pcn 2)  ! Iodine 3)  ! Ceclor 4)  ! * Singulair 5)  ! * Sulfa Drugs 6)  ! Zithromax  Medication Administration  Injection # 1:    Medication: Xolair (omalizumab) 150mg     Diagnosis: EXTRINSIC ASTHMA, UNSPECIFIED (ICD-493.00)    Route: SQ    Site: L deltoid    Exp Date: 08/2012    Lot #: [id number]    Mfr: Mendel Ryder    Comments: Injection given by Drucie Opitz, CMA in allergy lab. Xolair 150mg . 1.29ml x 1 in Left Deltoid. Pt waited 20 minutes.     Patient tolerated injection without complications  Orders Added: 1)  Admin of Therapeutic Inj  intramuscular or subcutaneous [96372] 2)  Xolair (omalizumab) 150mg  [J2357]

## 2010-09-23 NOTE — Assessment & Plan Note (Signed)
Summary: xolair////kp      Current Allergies: ! PCN ! IODINE ! CECLOR ! * SINGULAIR ! * SULFA DRUGS        Complete Medication List: 1)  Advair Diskus 500-50 Mcg/dose Misc (Fluticasone-salmeterol) .Marland Kitchen.. 1 puff two times a day 2)  Claritin 10 Mg Tabs (Loratadine) .Marland Kitchen.. 1 by mouth once daily 3)  Benicar 40 Mg Tabs (Olmesartan medoxomil) .Marland Kitchen.. 1 by mouth once daily 4)  Fish Oil 1000 Mg Caps (Omega-3 fatty acids) .... 4 by mouth once daily 5)  Calcium 500 500 Mg Tabs (Calcium carbonate) .Marland Kitchen.. 1 by mouth two times a day 6)  Cvs Daily Multiple Tabs (Multiple vitamin) .Marland Kitchen.. 1 by mouth once daily 7)  Flonase 50 Mcg/act Susp (Fluticasone propionate) .... 2 sprays each nostril once daily 8)  Crestor 5 Mg Tabs (Rosuvastatin calcium) .... 1/2 every other day 9)  Xolair 150 Mg Solr (Omalizumab) .... Every 4 weeks 10)  Cvs Vitamin B-6 100 Mg Tabs (Pyridoxine hcl) .Marland Kitchen.. 1 by mouth once daily 11)  Nexium 40 Mg Cpdr (Esomeprazole magnesium) .Marland Kitchen.. 1 by mouth once daily 12)  Baby Aspirin 81 Mg Chew (Aspirin) .... Take 1 tablet by mouth two times a day 13)  Albuterol 90 Mcg/act Aers (Albuterol) .... 2 puffs qid prn 14)  Twin Jet-epinephrine  .... Use as directed 15)  Astelin 137 Mcg/spray Soln (Azelastine hcl) .... Take 2 sprays in each nostril twice a day 16)  Albuterol-ipratropium 2.5-0.5 Mg/69ml Soln (Albuterol-ipratropium) .Marland Kitchen.. 1 4 times daily as needed    ]    Medication Administration  Injection # 1:    Medication: Xolair (omalizumab) 150mg     Diagnosis: EXTRINSIC ASTHMA, UNSPECIFIED (ICD-493.00)    Route: SQ    Site: R deltoid    Exp Date: 08/2011    Lot #: [id number]    Mfr: Mendel Ryder    Comments: Injeciton given by Dimas Millin in allergy lab. Xolair 150mg . 1.34ml x 1 in Right Deltoid. Pt waited 30 minutes.   Orders Added: 1)  Admin of Therapeutic Inj  intramuscular or subcutaneous [96372] 2)  Xolair (omalizumab) 150mg  [J2357]

## 2010-09-23 NOTE — Miscellaneous (Signed)
Summary: Orders Update pft charges  Clinical Lists Changes  Orders: Added new Service order of Carbon Monoxide diffusing w/capacity (94720) - Signed Added new Service order of Lung Volumes (94240) - Signed Added new Service order of Spirometry (Pre & Post) (94060) - Signed 

## 2010-09-23 NOTE — Assessment & Plan Note (Signed)
Summary: ROV 1 MONTH///KP   Primary Provider/Referring Provider:  Benedetto Goad   History of Present Illness: Jan 07, 2010- Asthma/ steroid dep/ Xolair, COPD, allergic rhinitis, VCD She is feeling pretty well with no new concerns. She credits Xolair for getting through allergy season but did have increased wheeze, sneeze without need for steroids or ER visit.. She is now geting better as season progresses. Last prednisone was in December and February. We reviewed meds. Saw her ENT for sore throat, with laryngoscopy. Throat is still sore intermittently. Right side of throat still feels sore with cold drink. Swallows ok and occasionally hoarse chronically without change.. She is worried because father had laryngeal cancer. She asks about whether she has COPD and we agreed to update PFT. There is hx of PHTN noted on echo years ago after diet drug. She will ask her cardiologist about this.  February 12, 2010- Asthma/ steroid dependent/ Xolair,  allergic rhinitis, VCD Heat makes her tired but otw "ok". No recent need for steroids or hospital. Uses rescue inhaler often at bedtime for prevention, using it 5/7 days at least once, but sometimes just one puff. PFT- mild/ mod obst, w/ response to dilator. FEV1/ FVC 0.72. CXR- WNL/ NAD 6 MWT- 97%, 98%, 96%, 384 meters.      Asthma History    Initial Asthma Severity Rating:    Age range: 12+ years    Symptoms: 0-2 days/week    Nighttime Awakenings: 0-2/month    Interferes w/ normal activity: minor limitations    SABA use (not for EIB): >2 days/week but not >1X/day    Asthma Severity Assessment: Mild Persistent   Preventive Screening-Counseling & Management  Alcohol-Tobacco     Smoking Status: never     Tobacco Counseling: to remain off tobacco products  Current Medications (verified): 1)  Advair Diskus 500-50 Mcg/dose  Misc (Fluticasone-Salmeterol) .Marland Kitchen.. 1 Puff Two Times A Day 2)  Claritin 10 Mg  Tabs (Loratadine) .Marland Kitchen.. 1 By Mouth Once Daily 3)   Cozaar .... Take 1 Tablet By Mouth Once A Day 4)  Fish Oil 1000 Mg  Caps (Omega-3 Fatty Acids) .... 4 By Mouth Once Daily 5)  Calcium 500 500 Mg  Tabs (Calcium Carbonate) .Marland Kitchen.. 1 By Mouth Two Times A Day 6)  Cvs Daily Multiple   Tabs (Multiple Vitamin) .Marland Kitchen.. 1 By Mouth Once Daily 7)  Flonase 50 Mcg/act  Susp (Fluticasone Propionate) .... 2 Sprays Each Nostril Once Daily 8)  Crestor 5 Mg  Tabs (Rosuvastatin Calcium) .... 1/2 Every Other Day 9)  Xolair 150 Mg  Solr (Omalizumab) .... Every 4 Weeks 10)  Cvs Vitamin B-6 100 Mg  Tabs (Pyridoxine Hcl) .Marland Kitchen.. 1 By Mouth Once Daily 11)  Protonix 40 Mg Tbec (Pantoprazole Sodium) .... Take 1 Tablet By Mouth Once A Day 12)  Ventolin Hfa 108 (90 Base) Mcg/act Aers (Albuterol Sulfate) .... 2 Puffs Every 4 Hours As Needed 13)  Twin Jet-Epinephrine .... Use As Directed 14)  Astelin 137 Mcg/spray  Soln (Azelastine Hcl) .... Take 2 Sprays in Each Nostril Twice A Day 15)  Albuterol-Ipratropium 2.5-0.5 Mg/6ml  Soln (Albuterol-Ipratropium) .Marland Kitchen.. 1 4 Times Daily As Needed 16)  Plavix 75 Mg Tabs (Clopidogrel Bisulfate) .... Take 1 By Mouth Once Daily 17)  Patanol 0.1 % Soln (Olopatadine Hcl) .Marland Kitchen.. 1 Drop Each Eye Two Times A Day  As Needed  Allergies (verified): 1)  ! Pcn 2)  ! Iodine 3)  ! Ceclor 4)  ! * Singulair 5)  ! * Sulfa Drugs  6)  ! Zithromax  Past History:  Past Medical History: Last updated: 02/13/2009  PERIPHERAL VASCULAR DISEASE (ICD-443.9) TIA- 2/10 CAD (ICD-414.00) ACUTE MUCOID OTITIS MEDIA (ICD-381.02) GLAUCOMA (ICD-365.9) NEUROPATHY (ICD-355.9) SLEEP DISORDER (ICD-780.50) FIBROMYALGIA (ICD-729.1) MITRAL VALVE PROLAPSE (ICD-424.0) VOCAL CORD DISORDER (ICD-478.5) COPD (ICD-496) EXTRINSIC ASTHMA, UNSPECIFIED (ICD-493.00) ALLERGIC RHINITIS (ICD-477.9) Glaucoma- intolerant of Spiriva  Past Surgical History: Last updated: 04/10/2008 Rotator cuff- right C- sectionx3  Family History: Last updated: 02-06-10 Father- died heart disease  and laryngeal cancer Sister- died multiple myeloma Mtoher had mild asthma Uncles- died emphysema daughter- asthma  Social History: Last updated: 08/13/2008 Patient never smoked.  Married  Risk Factors: Smoking Status: never (02/12/2010)  Review of Systems      See HPI       The patient complains of shortness of breath with activity.  The patient denies shortness of breath at rest, productive cough, non-productive cough, coughing up blood, chest pain, irregular heartbeats, acid heartburn, indigestion, loss of appetite, weight change, abdominal pain, difficulty swallowing, sore throat, tooth/dental problems, headaches, nasal congestion/difficulty breathing through nose, and sneezing.    Vital Signs:  Patient profile:   65 year old female Height:      62 inches Weight:      163 pounds BMI:     29.92 O2 Sat:      96 % on Room air Pulse rate:   63 / minute BP sitting:   100 / 70  (left arm) Cuff size:   regular  Vitals Entered By: Reynaldo Minium CMA (February 12, 2010 10:14 AM)  O2 Flow:  Room air   Physical Exam  Additional Exam:  General: A/Ox3; pleasant and cooperative, NAD, looks thinner, tired but comfortable- similar to last visit SKIN: no rash, lesions NODES: no lymphadenopathy HEENT: [place]/AT, EOM- WNL, Conjuctivae- clear, PERRLA, periorbital edema without conjunctival injection TM-WNL, Nose- little mucus, Throat- clear and wnl, Mallampati II-III NECK: Supple w/ fair ROM, JVD- none, normal carotid impulses w/o bruits Thyroid-  CHEST: Clear to P&A, end-inspiratory wheeze esp in deep lung bases.Marland Kitchen HEART: RRR, no m/g/r heard ABDOMEN: overweight EAV:WUJW, nl pulses, no edema  NEURO: Grossly intact to observation      CXR  Procedure date:  02-06-10  Findings:      DG CHEST 2 VIEW - [id number]   Clinical Data: Asthma, follow-up   CHEST - 2 VIEW   Comparison: Chest x-ray of 05/08/2006   Findings: The lungs are clear.  Mediastinal contours are stable. The heart is  within normal limits in size.  No bony abnormality is seen.   IMPRESSION: Stable chest x-ray.  No active lung disease.   Read By:  Juline Patch,  M.D.     Released By:  Juline Patch,  M.D.   Impression & Recommendations:  Problem # 1:  CHRONIC OBSTRUCTIVE ASTHMA UNSPECIFIED (ICD-493.20)  Chronic obstructive asthma. There is a small fixed component consistent with chronic asthma. We talked about pulmonary rehab and compared it to her own walking effort which she does about 15 minutes/day.  Her updated medication list for this problem includes:    Plavix 75 Mg Tabs (Clopidogrel bisulfate) .Marland Kitchen... Take 1 by mouth once daily  Orders: Est. Patient Level IV (95621)  Problem # 2:  COPD (ICD-496) Most of her condition is reversible. i will take this dx off her list for now by way of clarification.  Problem # 3:  CAD (ICD-414.00) Assessment: Improved  She had an echo by her cardiologist, who said there was no  pulmonary hypertension. Mitral and tricuspid rerg are both mild. Her updated medication list for this problem includes:    Plavix 75 Mg Tabs (Clopidogrel bisulfate) .Marland Kitchen... Take 1 by mouth once daily  Patient Instructions: 1)  Please schedule a follow-up appointment in 4 months 2)  Please ask your cardiologist for a copy of your echocardiogram for our file. 3)  Refill scripts- Advair, Claritin 4)  Renew HC parking Prescriptions: CLARITIN 10 MG  TABS (LORATADINE) 1 by mouth once daily  #90 x 3   Entered and Authorized by:   Waymon Budge MD   Signed by:   Waymon Budge MD on 02/12/2010   Method used:   Print then Give to Patient   RxID:   [id number] ADVAIR DISKUS 500-50 MCG/DOSE  MISC (FLUTICASONE-SALMETEROL) 1 puff two times a day  #3 x 3   Entered and Authorized by:   Waymon Budge MD   Signed by:   Waymon Budge MD on 02/12/2010   Method used:   Print then Give to Patient   RxID:   [id number]      CXR  Procedure date:  01/07/2010  Findings:       DG CHEST 2 VIEW - [id number]   Clinical Data: Asthma, follow-up   CHEST - 2 VIEW   Comparison: Chest x-ray of 05/08/2006   Findings: The lungs are clear.  Mediastinal contours are stable. The heart is within normal limits in size.  No bony abnormality is seen.   IMPRESSION: Stable chest x-ray.  No active lung disease.   Read By:  Juline Patch,  M.D.     Released By:  Juline Patch,  M.D.    Medication Administration  Injection # 1:    Medication: Xolair (omalizumab) 150mg     Diagnosis: EXTRINSIC ASTHMA, UNSPECIFIED (ICD-493.00)    Route: SQ    Site: R deltoid    Exp Date: 12/20/2012    Lot #: [id number]    Mfr: GENENTCH    Comments: 1.2 ML IN RIGHT ARM CHARGED [id number] AND [phone]    Patient tolerated injection without complications    Given by: TAMMY SCOTT IN ALLERGY LAB  Orders Added: 1)  Est. Patient Level IV [95093]    Medication Administration  Injection # 1:    Medication: Xolair (omalizumab) 150mg     Diagnosis: EXTRINSIC ASTHMA, UNSPECIFIED (ICD-493.00)    Route: SQ    Site: R deltoid    Exp Date: 12/20/2012    Lot #: [id number]    Mfr: GENENTCH    Comments: 1.2 ML IN RIGHT ARM CHARGED [id number] AND [id number]    Patient tolerated injection without complications    Given by: TAMMY SCOTT IN ALLERGY LAB  Orders Added: 1)  Est. Patient Level IV [58099]

## 2010-09-23 NOTE — Assessment & Plan Note (Signed)
Summary: xolair/apc   Allergies: 1)  ! Pcn 2)  ! Iodine 3)  ! Ceclor 4)  ! * Singulair 5)  ! * Sulfa Drugs 6)  ! Zithromax   Other Orders: Xolair (omalizumab) 150mg  (N8295) Admin of Therapeutic Inj  intramuscular or subcutaneous (62130)   Medication Administration  Injection # 1:    Medication: Xolair (omalizumab) 150mg     Diagnosis: EXTRINSIC ASTHMA, UNSPECIFIED (ICD-493.00)    Route: SQ    Site: L deltoid    Exp Date: 05/22/2012    Lot #: [id number]    Mfr: GENENTECH    Comments: 1.2 ML IN LEFT ARM PT WAITED 30 MINS    Patient tolerated injection without complications    Given by: TAMMY SCOTT IN ALLERGY LAB  Orders Added: 1)  Xolair (omalizumab) 150mg  [J2357] 2)  Admin of Therapeutic Inj  intramuscular or subcutaneous [69629]

## 2010-09-23 NOTE — Assessment & Plan Note (Signed)
Summary: xolair///kp      Current Allergies: ! PCN ! IODINE ! CECLOR ! * SINGULAIR ! * SULFA DRUGS          ]  Medication Administration  Injection # 1:    Medication: Xolair (omalizumab) 150mg     Diagnosis: EXTRINSIC ASTHMA, UNSPECIFIED (ICD-493.00)    Route: SQ    Site: R deltoid    Exp Date: 10/2011    Lot #: [id number]    Mfr: Mendel Ryder    Comments: Injection given by Dimas Millin in allergy lab. Xolair 150mg . 1.49ml x 1in Right Deltoid.  Pt waited 30 minutes.   Orders Added: 1)  Admin of Therapeutic Inj  intramuscular or subcutaneous [96372] 2)  Xolair (omalizumab) 150mg  [J2357]
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
## 2010-09-23 NOTE — Assessment & Plan Note (Signed)
Summary: XOLAIR///KP  Nurse Visit   Allergies: 1)  ! Pcn 2)  ! Iodine 3)  ! Ceclor 4)  ! * Singulair 5)  ! * Sulfa Drugs 6)  ! Zithromax  Medication Administration  Injection # 1:    Medication: Xolair (omalizumab) 150mg     Diagnosis: EXTRINSIC ASTHMA, UNSPECIFIED (ICD-493.00)    Route: IM    Site: L deltoid    Exp Date: 03/22/2013    Lot #: [id number]    Mfr: [employer]etech    Comments: xolair 150 mg, 30 units; 1.2 ml in Left deltoid. Pt waited 20 mins    Given by: Drucie Opitz, CMA  Orders Added: 1)  Xolair (omalizumab) 150mg  [J2357] 2)  Administration xolair injection [phone]

## 2010-09-23 NOTE — Assessment & Plan Note (Signed)
Summary: xolair/apc   Allergies: 1)  ! Pcn 2)  ! Iodine 3)  ! Ceclor 4)  ! * Singulair 5)  ! * Sulfa Drugs   Other Orders: Admin of Therapeutic Inj  intramuscular or subcutaneous (16109) Xolair (omalizumab) 150mg  (U0454)     Medication Administration  Injection # 1:    Medication: Xolair (omalizumab) 150mg     Diagnosis: EXTRINSIC ASTHMA, UNSPECIFIED (ICD-493.00)    Route: SQ    Site: R deltoid    Exp Date: 08/2011    Lot #: [id number]    Mfr: Mendel Ryder    Comments: Xolair 150mg . 1.47ml x 1 in Right Deltoid. Pt waited 20 minutes.     Patient tolerated injection without complications    Given by: Reynaldo Minium CMA (Jan 08, 2009 12:25 PM)  Orders Added: 1)  Admin of Therapeutic Inj  intramuscular or subcutaneous [96372] 2)  Xolair (omalizumab) 150mg  [J2357]

## 2010-09-23 NOTE — Assessment & Plan Note (Signed)
Summary: 4 months/apc   Primary Provider/Referring Provider:  Benedetto Goad  CC:  4 month follow up visit.  History of Present Illness:  04/10/08- A 65 year old woman returning for follow-up of allergic rhinitis and asthma complicated by a history of glaucoma, fibromyalgia, vocal cord dysfunction.  She never smoked.  We discussed medications.  Her last emergency room trip was in February.  Last prednisone was around that time.  Has been taking Xolair injections here each month.  Considers herself well now, with occasional wheeze.  We spent extra time discussing Xolair.  She has a peak flow meter, but had not been using it.  08/13/08- Asthma/steroid dep/ COPD, allergic rhinitis, VCD Cardiologist added Plavix, anticipating some increased vascular risk with Xolair, which she accepts- clearly better off with Xolair. Can feel when Xolair is wearing off- few days of increased wheeze.Has had both flu vaccines.  12/11/08- Asthma/ steroid dep/Xolair, COPD, all,ergic rhinitis, COPD She definitely feels Xolair has been a big help and has kept her out of hospital, given the pollen season. she does use claritin and astelin as needed for breakthrough nasal drip and sneeze with itchy eyes. Needs meds refilled. Gets sore crusty area at left nare.     Current Medications (verified): 1)  Advair Diskus 500-50 Mcg/dose  Misc (Fluticasone-Salmeterol) .Marland Kitchen.. 1 Puff Two Times A Day 2)  Claritin 10 Mg  Tabs (Loratadine) .Marland Kitchen.. 1 By Mouth Once Daily 3)  Benicar 40 Mg  Tabs (Olmesartan Medoxomil) .Marland Kitchen.. 1 By Mouth Once Daily 4)  Fish Oil 1000 Mg  Caps (Omega-3 Fatty Acids) .... 4 By Mouth Once Daily 5)  Calcium 500 500 Mg  Tabs (Calcium Carbonate) .Marland Kitchen.. 1 By Mouth Two Times A Day 6)  Cvs Daily Multiple   Tabs (Multiple Vitamin) .Marland Kitchen.. 1 By Mouth Once Daily 7)  Flonase 50 Mcg/act  Susp (Fluticasone Propionate) .... 2 Sprays Each Nostril Once Daily 8)  Crestor 5 Mg  Tabs (Rosuvastatin Calcium) .... 1/2 Every Other Day 9)  Xolair  150 Mg  Solr (Omalizumab) .... Every 4 Weeks 10)  Cvs Vitamin B-6 100 Mg  Tabs (Pyridoxine Hcl) .Marland Kitchen.. 1 By Mouth Once Daily 11)  Nexium 40 Mg  Cpdr (Esomeprazole Magnesium) .Marland Kitchen.. 1 By Mouth Once Daily 12)  Baby Aspirin 81 Mg  Chew (Aspirin) .... Take 1 Tablet By Mouth Two Times A Day 13)  Albuterol 90 Mcg/act  Aers (Albuterol) .... 2 Puffs Qid Prn 14)  Twin Jet-Epinephrine .... Use As Directed 15)  Astelin 137 Mcg/spray  Soln (Azelastine Hcl) .... Take 2 Sprays in Each Nostril Twice A Day 16)  Albuterol-Ipratropium 2.5-0.5 Mg/89ml  Soln (Albuterol-Ipratropium) .Marland Kitchen.. 1 4 Times Daily As Needed 17)  Plavix 75 Mg Tabs (Clopidogrel Bisulfate) .... Take 1 By Mouth Once Daily  Allergies (verified): 1)  ! Pcn 2)  ! Iodine 3)  ! Ceclor 4)  ! * Singulair 5)  ! * Sulfa Drugs  Past History:  Social History:    Patient never smoked.     Married     (08/13/2008)  Risk Factors:    Alcohol Use: N/A    >5 drinks/d w/in last 3 months: N/A    Caffeine Use: N/A    Diet: N/A    Exercise: N/A  Risk Factors:    Smoking Status: never (12/05/2007)    Packs/Day: N/A    Cigars/wk: N/A    Pipe Use/wk: N/A    Cans of tobacco/wk: N/A    Passive Smoke Exposure: N/A  Past medical, surgical, family  and social histories (including risk factors) reviewed for relevance to current acute and chronic problems.  Past Medical History:    Reviewed history from 08/13/2008 and no changes required:        PERIPHERAL VASCULAR DISEASE (ICD-443.9)    CAD (ICD-414.00)    ACUTE MUCOID OTITIS MEDIA (ICD-381.02)    GLAUCOMA (ICD-365.9)    NEUROPATHY (ICD-355.9)    SLEEP DISORDER (ICD-780.50)    FIBROMYALGIA (ICD-729.1)    MITRAL VALVE PROLAPSE (ICD-424.0)    VOCAL CORD DISORDER (ICD-478.5)    COPD (ICD-496)    EXTRINSIC ASTHMA, UNSPECIFIED (ICD-493.00)    ALLERGIC RHINITIS (ICD-477.9)    Glaucoma- intolerant of Spiriva  Past Surgical History:    Reviewed history from 04/10/2008 and no changes required:     Rotator cuff- right    C- sectionx3  Family History:    Reviewed history and no changes required:       Father- died heart disease and laryngeal cancer       Mtoher had mild asthma       Uncles- died emphysema       daughter- asthma  Social History:    Reviewed history from 08/13/2008 and no changes required:       Patient never smoked.        Married  Review of Systems       The patient complains of shortness of breath with activity, productive cough, and non-productive cough.  The patient denies shortness of breath at rest, coughing up blood, chest pain, irregular heartbeats, acid heartburn, indigestion, loss of appetite, weight change, difficulty swallowing, sore throat, nasal congestion/difficulty breathing through nose, sneezing, itching, joint stiffness or pain, and rash.    Vital Signs:  Patient profile:   65 year old female Height:      62 inches Weight:      161 pounds BMI:     29.55 Pulse rate:   55 / minute BP sitting:   124 / 70  (left arm) Cuff size:   regular  Vitals Entered By: Reynaldo Minium CMA (December 11, 2008 9:04 AM)  O2 Sat on room air at rest %:  95 CC: 4 month follow up visit Comments Medications reviewed with patient Reynaldo Minium CMA  December 11, 2008 9:04 AM    Physical Exam  Additional Exam:  General: A/Ox3; pleasant and cooperative, NAD, looks thinner, tired but comfortable SKIN: no rash, lesions NODES: no lymphadenopathy HEENT: [place]/AT, EOM- WNL, Conjuctivae- clear, PERRLA, periorbital edema without conjunctival injection TM-WNL, Nose- clear, Throat- clear and wnl NECK: Supple w/ fair ROM, JVD- none, normal carotid impulses w/o bruits Thyroid- normal to palpation CHEST: Clear to P&A, minor dry cough once or twice, but fair airflow and no wheeze HEART: RRR, no m/g/r heard ABDOMEN:  JJO:ACZY, nl pulses, no edema  NEURO: Grossly intact to observation      Impression & Recommendations:  Problem # 1:  EXTRINSIC ASTHMA, UNSPECIFIED (ICD-493.00)  Discussed her Xolair experience again.Control is quite good at this time. has been off of prednisone.  Problem # 2:  ALLERGIC RHINITIS (ICD-477.9) discussed antihistamines and ongoing meds.  Her updated medication list for this problem includes:    Claritin 10 Mg Tabs (Loratadine) .Marland Kitchen... 1 by mouth once daily    Flonase 50 Mcg/act Susp (Fluticasone propionate) .Marland Kitchen... 2 sprays each nostril once daily    Astelin 137 Mcg/spray Soln (Azelastine hcl) .Marland Kitchen... Take 2 sprays in each nostril twice a day  Medications Added to Medication List This Visit: 1)  Ventolin Hfa 108 (90 Base) Mcg/act Aers (Albuterol sulfate) .... 2 puffs every 4 hours as needed 2)  Patanol 0.1 % Soln (Olopatadine hcl) .Marland Kitchen.. 1 drop each eye two times a day  as needed  Other Orders: Est. Patient Level III (16109) Xolair (omalizumab) 150mg  (U0454) Admin of Therapeutic Inj  intramuscular or subcutaneous (09811)  Patient Instructions: 1)  Please schedule a follow-up appointment in 6 months. 2)  scripts refilled.  3)  Continue Xolair 4)  Call if needed sooner Prescriptions: PATANOL 0.1 % SOLN (OLOPATADINE HCL) 1 drop each eye two times a day  as needed  #3 x 3   Entered and Authorized by:   Waymon Budge MD   Signed by:   Waymon Budge MD on 12/11/2008   Method used:   Print then Give to Patient   RxID:   [id number] VENTOLIN HFA 108 (90 BASE) MCG/ACT AERS (ALBUTEROL SULFATE) 2 puffs every 4 hours as needed  #4 x 3   Entered and Authorized by:   Waymon Budge MD   Signed by:   Waymon Budge MD on 12/11/2008   Method used:   Print then Give to Patient   RxID:   [id number] FLONASE 50 MCG/ACT  SUSP (FLUTICASONE PROPIONATE) 2 sprays each nostril once daily  #3 x 3   Entered and Authorized by:   Waymon Budge MD   Signed by:   Waymon Budge MD on 12/11/2008   Method used:   Print then Give to Patient   RxID:   [id number] CLARITIN 10 MG  TABS (LORATADINE) 1 by mouth once daily  #90 x 3   Entered  and Authorized by:   Waymon Budge MD   Signed by:   Waymon Budge MD on 12/11/2008   Method used:   Print then Give to Patient   RxID:   [id number] ADVAIR DISKUS 500-50 MCG/DOSE  MISC (FLUTICASONE-SALMETEROL) 1 puff two times a day  #3 x 3   Entered and Authorized by:   Waymon Budge MD   Signed by:   Waymon Budge MD on 12/11/2008   Method used:   Print then Give to Patient   RxID:   [id number]    Medication Administration  Injection # 1:    Medication: Xolair (omalizumab) 150mg     Diagnosis: EXTRINSIC ASTHMA, UNSPECIFIED (ICD-493.00)    Route: SQ    Site: L deltoid    Exp Date: 02/20/2012    Lot #: [id number]    Mfr: genentech    Comments: 1.2ML LEFT ARM PT WAITED 30 MINS    Patient tolerated injection without complications    Given by: TAMMY SCOTT IN ALLERGY LAB  Orders Added: 1)  Est. Patient Level III [55732] 2)  Xolair (omalizumab) 150mg  [J2357] 3)  Admin of Therapeutic Inj  intramuscular or subcutaneous [20254]

## 2010-09-23 NOTE — Assessment & Plan Note (Signed)
Summary: xolair///kp  Nurse Visit   Allergies: 1)  ! Pcn 2)  ! Iodine 3)  ! Ceclor 4)  ! * Singulair 5)  ! * Sulfa Drugs 6)  ! Zithromax  Medication Administration  Injection # 1:    Medication: Xolair (omalizumab) 150mg     Diagnosis: EXTRINSIC ASTHMA, UNSPECIFIED (ICD-493.00)    Route: SQ    Site: R deltoid    Exp Date: 10/2012    Lot #: [id number]    Mfr: Mendel Ryder    Comments: Injection given by Clarise Cruz in allergy lab. Xolair 150mg . 1.53ml x 1 in Right Deltoid. Pt waited 30 minutes.     Patient tolerated injection without complications  Orders Added: 1)  Admin of Therapeutic Inj  intramuscular or subcutaneous [96372] 2)  Xolair (omalizumab) 150mg  [J2357]

## 2010-09-23 NOTE — Assessment & Plan Note (Signed)
Summary: xolair/apc  Nurse Visit   Allergies: 1)  ! Pcn 2)  ! Iodine 3)  ! Ceclor 4)  ! * Singulair 5)  ! * Sulfa Drugs 6)  ! Zithromax  Medication Administration  Injection # 1:    Medication: Xolair (omalizumab) 150mg     Diagnosis: EXTRINSIC ASTHMA, UNSPECIFIED (ICD-493.00)    Route: SQ    Site: R deltoid    Exp Date: 06/2012    Lot #: [id number]    Mfr: Mendel Ryder    Comments: Injection given by Clarise Cruz in allergy lab. Xoalir 150mg . 1.62ml x 1 in Right Deltoid. Pt waited 20 minutes.     Patient tolerated injection without complications  Orders Added: 1)  Admin of Therapeutic Inj  intramuscular or subcutaneous [96372] 2)  Xolair (omalizumab) 150mg  [J2357]

## 2010-09-23 NOTE — Progress Notes (Signed)
Summary: advair samples  Phone Note Call from Patient Call back at [phone]   Caller: Patient Call For: young Summary of Call: Wants sample of advair diskus 100-50 micrograms. Initial call taken by: Darletta Moll,  June 14, 2010 9:55 AM  Follow-up for Phone Call        last seen by CDY 06-10-10.  called spoke with patient who states that she accidentally "banged" her inhaler and now it is not working properly - per pt pharmacy tried to override the rx so that insurance would pay for another, but insurance refuses.  requests samples to replace the damaged inhaler.  2 samples advair 100-55mcg left up front for pt to pick up at her convenience.  pt verbalized her understanding. Follow-up by: Boone Master CNA/MA,  June 14, 2010 10:59 AM

## 2010-09-23 NOTE — Medication Information (Signed)
Summary: Request for Xolair/[employer]  Request for Xolair/[employer]   Imported By: Sherian Rein 12/23/2009 10:56:04  _____________________________________________________________________  External Attachment:    Type:   Image     Comment:   External Document

## 2010-09-23 NOTE — Letter (Signed)
Summary: [facility] for Xolair  [employer] for Xolair   Imported By: Sherian Rein 12/02/2009 10:48:21  _____________________________________________________________________  External Attachment:    Type:   Image     Comment:   External Document

## 2010-09-23 NOTE — Progress Notes (Signed)
Summary: rx request- epipen  Phone Note Call from Patient Call back at Home Phone [phone]   Caller: Patient Call For: Debra Byrd  Summary of Call: pt [facility] [place] [facility].  Initial call taken by: Tivis Ringer,  April 09, 2009 4:54 PM  Follow-up for Phone Call        ok to send. Reynaldo Minium CMA  April 10, 2009 3:50 PM   Additional Follow-up for Phone Call Additional follow up Details #1::        rx sent pt notified. Carron Curie CMA  April 10, 2009 4:06 PM

## 2010-09-23 NOTE — Progress Notes (Signed)
Summary: asthma/ gas logs  Phone Note Call from Patient Call back at Home Phone [phone]   Caller: Patient Call For: young Summary of Call: pt concerned re: her husband wanting to install fireplace w/ "gas" logs. she feels this will be really bad given that she has asthma. pt says that if dr young also agrees with this she would appreciate if nurse could call and leave a msg stating this on their answering machine. this way perhaps her husband will understand.  Initial call taken by: Tivis Ringer, CNA,  July 23, 2010 9:11 AM  Follow-up for Phone Call        [id number]. Carron Curie CMA  July 23, 2010 10:21 AM  Spoke with pt and she states that her husband wants to install gas logs in their house and she is concerned this will effec ther asthma. She states theitr heat in the house is gas as well, but she thought the gas logs may be different and effect her asthma. Please advsie. Carron Curie CMA  July 23, 2010 10:32 AM   Additional Follow-up for Phone Call Additional follow up Details #1::        per CY-this is usually ok and is better then wood fire. Carron Curie CMA  July 23, 2010 12:28 PM  pt advised.Carron Curie CMA  July 23, 2010 12:29 PM

## 2010-09-23 NOTE — Assessment & Plan Note (Signed)
Summary: 4 months/apc   PCP:  Benedetto Goad  Chief Complaint:  follow up visit .  History of Present Illness: Current Problems:  ACUTE MUCOID OTITIS MEDIA (ICD-381.02) GLAUCOMA (ICD-365.9) NEUROPATHY (ICD-355.9) SLEEP DISORDER (ICD-780.50) FIBROMYALGIA (ICD-729.1) MITRAL VALVE PROLAPSE (ICD-424.0) VOCAL CORD DISORDER (ICD-478.5) COPD (ICD-496) EXTRINSIC ASTHMA, UNSPECIFIED (ICD-493.00) ALLERGIC RHINITIS (ICD-477.9)  04/10/08- A 65 year old woman returning for follow-up of allergic rhinitis and asthma complicated by a history of glaucoma, fibromyalgia, vocal cord dysfunction.  She never smoked.  We discussed medications.  Her last emergency room trip was in February.  Last prednisone was around that time.  Has been taking Xolair injections here each month.  Considers herself well now, with occasional wheeze.  We spent extra time discussing Xolair.  She has a peak flow meter, but had not been using it.  08/13/08- Asthma/steroid dep/ COPD, allergic rhinitis, VCD Cardiologist added Plavix, anticipating some increased vascular risk with Xolair, which she accepts- clearly better off with Xolair. Can feel when Xolair is wearing off- few days of increased wheeze.Has had both flu vaccines.          Prior Medications Reviewed Using: Patient Recall  Updated Prior Medication List: ADVAIR DISKUS 500-50 MCG/DOSE  MISC (FLUTICASONE-SALMETEROL) 1 puff two times a day CLARITIN 10 MG  TABS (LORATADINE) 1 by mouth once daily BENICAR 40 MG  TABS (OLMESARTAN MEDOXOMIL) 1 by mouth once daily FISH OIL 1000 MG  CAPS (OMEGA-3 FATTY ACIDS) 4 by mouth once daily CALCIUM 500 500 MG  TABS (CALCIUM CARBONATE) 1 by mouth two times a day [facility] DAILY MULTIPLE   TABS (MULTIPLE VITAMIN) 1 by mouth once daily FLONASE 50 MCG/ACT  SUSP (FLUTICASONE PROPIONATE) 2 sprays each nostril once daily CRESTOR 5 MG  TABS (ROSUVASTATIN CALCIUM) 1/2 every other day XOLAIR 150 MG  SOLR (OMALIZUMAB) every 4 weeks CVS VITAMIN B-6  100 MG  TABS (PYRIDOXINE HCL) 1 by mouth once daily NEXIUM 40 MG  CPDR (ESOMEPRAZOLE MAGNESIUM) 1 by mouth once daily BABY ASPIRIN 81 MG  CHEW (ASPIRIN) Take 1 tablet by mouth two times a day ALBUTEROL 90 MCG/ACT  AERS (ALBUTEROL) 2 puffs QID prn * TWIN JET-EPINEPHRINE use as directed ASTELIN 137 MCG/SPRAY  SOLN (AZELASTINE HCL) Take 2 sprays in each nostril twice a day ALBUTEROL-IPRATROPIUM 2.5-0.5 MG/3ML  SOLN (ALBUTEROL-IPRATROPIUM) 1 4 times daily as needed PLAVIX 75 MG TABS (CLOPIDOGREL BISULFATE) take 1 by mouth once daily  Current Allergies (reviewed today): ! PCN ! IODINE ! CECLOR ! * SINGULAIR ! * SULFA DRUGS  Past Medical History:    Reviewed history from 06/04/2007 and no changes required:              PERIPHERAL VASCULAR DISEASE (ICD-443.9)       CAD (ICD-414.00)       ACUTE MUCOID OTITIS MEDIA (ICD-381.02)       GLAUCOMA (ICD-365.9)       NEUROPATHY (ICD-355.9)       SLEEP DISORDER (ICD-780.50)       FIBROMYALGIA (ICD-729.1)       MITRAL VALVE PROLAPSE (ICD-424.0)       VOCAL CORD DISORDER (ICD-478.5)       COPD (ICD-496)       EXTRINSIC ASTHMA, UNSPECIFIED (ICD-493.00)       ALLERGIC RHINITIS (ICD-477.9)       Glaucoma- intolerant of Spiriva         Past Surgical History:    Reviewed history from 04/10/2008 and no changes required:       Rotator cuff-  right       C- sectionx3   Social History:    Reviewed history from 12/05/2007 and no changes required:       Patient never smoked.        Married    Review of Systems       Denies headache, sinus drainage, sneezing chest pain, dyspnea, n/v/d, weight loss, fever, edema.     Vital Signs:  Patient Profile:   65 Years Old Female Weight:      157.50 pounds O2 Sat:      99 % O2 treatment:    Room Air Pulse rate:   59 / minute BP sitting:   114 / 72  (left arm) Cuff size:   regular  Vitals Entered By: Reynaldo Minium CMA (August 13, 2008 10:00 AM)             Comments Medications reviewed with  patient Reynaldo Minium CMA  August 13, 2008 10:00 AM      Physical Exam  General: A/Ox3; pleasant and cooperative, NAD, looks thinner, tired but comfortable SKIN: no rash, lesions NODES: no lymphadenopathy HEENT: [place]/AT, EOM- WNL, Conjuctivae- clear, PERRLA, TM-WNL, Nose- clear, Throat- clear and wnl NECK: Supple w/ fair ROM, JVD- none, normal carotid impulses w/o bruits Thyroid- normal to palpation CHEST: Clear to P&A HEART: RRR, no m/g/r heard ABDOMEN: Soft and nl; nml bowel sounds; no organomegaly or masses noted YTK:ZSWF, nl pulses, no edema  NEURO: Grossly intact to observation         Problem # 1:  COPD (ICD-496) Asthma/ copd. Definitely more stable with xolair.  Her updated medication list for this problem includes:    Advair Diskus 500-50 Mcg/dose Misc (Fluticasone-salmeterol) .Marland Kitchen... 1 puff two times a day    Xolair 150 Mg Solr (Omalizumab) ..... Every 4 weeks    Albuterol 90 Mcg/act Aers (Albuterol) .Marland Kitchen... 2 puffs qid prn    Albuterol-ipratropium 2.5-0.5 Mg/93ml Soln (Albuterol-ipratropium) .Marland Kitchen... 1 4 times daily as needed   Medications Added to Medication List This Visit: 1)  Plavix 75 Mg Tabs (Clopidogrel bisulfate) .... Take 1 by mouth once daily   Patient Instructions: 1)  Please schedule a follow-up appointment in 4 months. 2)  Continue xolair 3)  Have a [employer] Christmas- call if needed.   ]

## 2010-09-23 NOTE — Letter (Signed)
Summary: Surgical Clearance/[facility]  Surgical Clearance/[facility]   Imported By: Sherian Rein 06/17/2010 08:25:21  _____________________________________________________________________  External Attachment:    Type:   Image     Comment:   External Document

## 2010-09-23 NOTE — Medication Information (Signed)
Summary: Omalizumab inject Approved/[employer]  Omalizumab inject Approved/[employer]   Imported By: Sherian Rein 12/23/2009 10:54:34  _____________________________________________________________________  External Attachment:    Type:   Image     Comment:   External Document

## 2010-09-23 NOTE — Assessment & Plan Note (Signed)
Summary: sinus problem/ mbw   Primary Provider/Referring Provider:  Benedetto Goad  CC:  Pt c/o facial pain with nausea and S.O.B after receiving Xolair on Monday.Marland Kitchen  History of Present Illness:  04/10/08- A 65 year old woman returning for follow-up of allergic rhinitis and asthma complicated by a history of glaucoma, fibromyalgia, vocal cord dysfunction.  She never smoked.  We discussed medications.  Her last emergency room trip was in February.  Last prednisone was around that time.  Has been taking Xolair injections here each month.  Considers herself well now, with occasional wheeze.  We spent extra time discussing Xolair.  She has a peak flow meter, but had not been using it.  08/13/08- Asthma/steroid dep/ COPD, allergic rhinitis, VCD Cardiologist added Plavix, anticipating some increased vascular risk with Xolair, which she accepts- clearly better off with Xolair. Can feel when Xolair is wearing off- few days of increased wheeze.Has had both flu vaccines.  01/05/2009- Asthma/ steroid dep/Xolair, COPD, all,ergic rhinitis, COPD She definitely feels Xolair has been a big help and has kept her out of hospital, given the pollen season. she does use claritin and astelin as needed for breakthrough nasal drip and sneeze with itchy eyes. Needs meds refilled. Gets sore crusty area at left nare.  02/13/09- Asthma/ steroid dep/ Xolair, COPD, allergic rhinitis, ?VCD.....................daughter here Retroorbital pressure pain bilaterally, began Monday after here for  Xolair shot earlier in day Feels smothered in chest  Saw primarry office without Rx. Malaise, without fever but based on past experience she suspects this is sinusitis. We discussed air quality. Ears ok- some "irritation" in right ear.    Current Medications (verified): 1)  Advair Diskus 500-50 Mcg/dose  Misc (Fluticasone-Salmeterol) .Marland Kitchen.. 1 Puff Two Times A Day 2)  Claritin 10 Mg  Tabs (Loratadine) .Marland Kitchen.. 1 By Mouth Once Daily 3)  Cozaar .... Take 1  Tablet By Mouth Once A Day 4)  Fish Oil 1000 Mg  Caps (Omega-3 Fatty Acids) .... 4 By Mouth Once Daily 5)  Calcium 500 500 Mg  Tabs (Calcium Carbonate) .Marland Kitchen.. 1 By Mouth Two Times A Day 6)  Cvs Daily Multiple   Tabs (Multiple Vitamin) .Marland Kitchen.. 1 By Mouth Once Daily 7)  Flonase 50 Mcg/act  Susp (Fluticasone Propionate) .... 2 Sprays Each Nostril Once Daily 8)  Crestor 5 Mg  Tabs (Rosuvastatin Calcium) .... 1/2 Every Other Day 9)  Xolair 150 Mg  Solr (Omalizumab) .... Every 4 Weeks 10)  Cvs Vitamin B-6 100 Mg  Tabs (Pyridoxine Hcl) .Marland Kitchen.. 1 By Mouth Once Daily 11)  Protonix 40 Mg Tbec (Pantoprazole Sodium) .... Take 1 Tablet By Mouth Once A Day 12)  Ventolin Hfa 108 (90 Base) Mcg/act Aers (Albuterol Sulfate) .... 2 Puffs Every 4 Hours As Needed 13)  Twin Jet-Epinephrine .... Use As Directed 14)  Astelin 137 Mcg/spray  Soln (Azelastine Hcl) .... Take 2 Sprays in Each Nostril Twice A Day 15)  Albuterol-Ipratropium 2.5-0.5 Mg/63ml  Soln (Albuterol-Ipratropium) .Marland Kitchen.. 1 4 Times Daily As Needed 16)  Plavix 75 Mg Tabs (Clopidogrel Bisulfate) .... Take 1 By Mouth Once Daily 17)  Patanol 0.1 % Soln (Olopatadine Hcl) .Marland Kitchen.. 1 Drop Each Eye Two Times A Day  As Needed  Allergies: 1)  ! Pcn 2)  ! Iodine 3)  ! Ceclor 4)  ! * Singulair 5)  ! * Sulfa Drugs 6)  ! Zithromax  Past History:  Past Surgical History: Last updated: 04/10/2008 Rotator cuff- right C- sectionx3  Family History: Last updated: 05-Jan-2009 Father- died heart disease  and laryngeal cancer Mtoher had mild asthma Uncles- died emphysema daughter- asthma  Social History: Last updated: 08/13/2008 Patient never smoked.  Married  Risk Factors: Smoking Status: never (12/05/2007)  Past Medical History:  PERIPHERAL VASCULAR DISEASE (ICD-443.9) TIA- 2/10 CAD (ICD-414.00) ACUTE MUCOID OTITIS MEDIA (ICD-381.02) GLAUCOMA (ICD-365.9) NEUROPATHY (ICD-355.9) SLEEP DISORDER (ICD-780.50) FIBROMYALGIA (ICD-729.1) MITRAL VALVE PROLAPSE  (ICD-424.0) VOCAL CORD DISORDER (ICD-478.5) COPD (ICD-496) EXTRINSIC ASTHMA, UNSPECIFIED (ICD-493.00) ALLERGIC RHINITIS (ICD-477.9) Glaucoma- intolerant of Spiriva  Review of Systems      See HPI  The patient denies anorexia, weight loss, weight gain, vision loss, decreased hearing, hoarseness, chest pain, syncope, headaches, hemoptysis, abdominal pain, melena, hematochezia, and severe indigestion/heartburn.    Vital Signs:  Patient profile:   65 year old female Height:      62 inches Weight:      159 pounds O2 Sat:      98 % on Room air Pulse rate:   62 / minute BP sitting:   100 / 70  (left arm) Cuff size:   regular  Vitals Entered By: Zackery Barefoot CMA (February 13, 2009 10:15 AM)  O2 Flow:  Room air CC: Pt c/o facial pain with nausea and S.O.B after receiving Xolair on Monday. Comments Medications reviewed with patient Zackery Barefoot CMA  February 13, 2009 10:18 AM    Physical Exam  Additional Exam:  General: A/Ox3; pleasant and cooperative, NAD, looks thinner, tired but comfortable SKIN: no rash, lesions NODES: no lymphadenopathy HEENT: [place]/AT, EOM- WNL, Conjuctivae- clear, PERRLA, periorbital edema without conjunctival injection TM-WNL, Nose- thick mucus  bridging, Throat- clear and wnl, [place] II-III NECK: Supple w/ fair ROM, JVD- none, normal carotid impulses w/o bruits Thyroid-  CHEST: Clear to P&A, minor dry cough once or twice, but fair airflow and no wheeze HEART: RRR, no m/g/r heard ABDOMEN:  NGE:XBMW, nl pulses, no edema  NEURO: Grossly intact to observation      Impression & Recommendations:  Problem # 1:  ALLERGIC RHINITIS (ICD-477.9)  Possible infectious rhinosinusitis vs air quality irritation. Will try doxycycline, neb neo, depo. Her updated medication list for this problem includes:    Claritin 10 Mg Tabs (Loratadine) .Marland Kitchen... 1 by mouth once daily    Flonase 50 Mcg/act Susp (Fluticasone propionate) .Marland Kitchen... 2 sprays each nostril once daily     Astelin 137 Mcg/spray Soln (Azelastine hcl) .Marland Kitchen... Take 2 sprays in each nostril twice a day  Orders: Est. Patient Level III (41324) Depo- Medrol 80mg  (J1040) Admin of Therapeutic Inj  intramuscular or subcutaneous (40102) EMR miscellaneous medications (EMRORAL)  Problem # 2:  COPD (ICD-496) Nonspecific increased tightness, may be from [place] or upper airway inflammation.  Medications Added to Medication List This Visit: 1)  Cozaar  .... Take 1 tablet by mouth once a day 2)  Protonix 40 Mg Tbec (Pantoprazole sodium) .... Take 1 tablet by mouth once a day 3)  Doxycycline Hyclate 100 Mg Tabs (Doxycycline hyclate) .... 2 today then 1 daily  Patient Instructions: 1)  Keep appt or call earlier as needed 2)  Neb nasal neo 3)  Depo 80 4)  Script for doxycycline sent to your drug store Prescriptions: DOXYCYCLINE HYCLATE 100 MG TABS (DOXYCYCLINE HYCLATE) 2 today then 1 daily  #8 x 0   Entered and Authorized by:   Waymon Budge MD   Signed by:   Waymon Budge MD on 02/13/2009   Method used:   Electronically to        [facility]  [address]  [id number] (retail)       [address]       [place], [place]  [address]       Ph: [phone] or [phone]       Fax: [phone]   RxID:   [id number]    Medication Administration  Injection # 1:    Medication: Depo- Medrol 80mg     Diagnosis: ALLERGIC RHINITIS (ICD-477.9)    Route: IM    Site: RUOQ gluteus    Exp Date: 06/23/2011    Lot #: [id number]    Mfr: Pharmacia    Patient tolerated injection without complications    Given by: Carron Curie CMA (February 13, 2009 10:54 AM)  Medication # 1:    Medication: EMR miscellaneous medications    Diagnosis: ALLERGIC RHINITIS (ICD-477.9)    Dose: 3 drops    Route: intranasal    Exp Date: 01/20/2010    Lot #: [id number]    Mfr: bayer    Comments: neosynephrine    Patient tolerated medication without complications    Given by: Carron Curie CMA (February 13, 2009 11:16 AM)   Orders Added: 1)  Est. Patient Level III [56387] 2)  Depo- Medrol 80mg  [J1040] 3)  Admin of Therapeutic Inj  intramuscular or subcutaneous [96372] 4)  EMR miscellaneous medications [EMRORAL]

## 2010-09-23 NOTE — Assessment & Plan Note (Signed)
Summary: xolair///kp      Current Allergies: ! PCN ! IODINE ! CECLOR ! * SINGULAIR ! * SULFA DRUGS        Complete Medication List: 1)  Advair Diskus 500-50 Mcg/dose Misc (Fluticasone-salmeterol) .Marland Kitchen.. 1 puff two times a day 2)  Claritin 10 Mg Tabs (Loratadine) .Marland Kitchen.. 1 by mouth once daily 3)  Benicar 40 Mg Tabs (Olmesartan medoxomil) .Marland Kitchen.. 1 by mouth once daily 4)  Fish Oil 1000 Mg Caps (Omega-3 fatty acids) .... 4 by mouth once daily 5)  Calcium 500 500 Mg Tabs (Calcium carbonate) .Marland Kitchen.. 1 by mouth two times a day 6)  Cvs Daily Multiple Tabs (Multiple vitamin) .Marland Kitchen.. 1 by mouth once daily 7)  Flonase 50 Mcg/act Susp (Fluticasone propionate) .... 2 sprays each nostril once daily 8)  Crestor 5 Mg Tabs (Rosuvastatin calcium) .... 1/2 every other day 9)  Xolair 150 Mg Solr (Omalizumab) .... Every 4 weeks 10)  Cvs Vitamin B-6 100 Mg Tabs (Pyridoxine hcl) .Marland Kitchen.. 1 by mouth once daily 11)  Nexium 40 Mg Cpdr (Esomeprazole magnesium) .Marland Kitchen.. 1 by mouth once daily 12)  Baby Aspirin 81 Mg Chew (Aspirin) .... Take 1 tablet by mouth two times a day 13)  Albuterol 90 Mcg/act Aers (Albuterol) .... 2 puffs qid prn 14)  Twin Jet-epinephrine  .... Use as directed 15)  Astelin 137 Mcg/spray Soln (Azelastine hcl) .... Take 2 sprays in each nostril twice a day 16)  Albuterol-ipratropium 2.5-0.5 Mg/52ml Soln (Albuterol-ipratropium) .Marland Kitchen.. 1 4 times daily as needed    ]  Medication Administration  Injection # 1:    Medication: Xolair (omalizumab) 150mg     Diagnosis: EXTRINSIC ASTHMA, UNSPECIFIED (ICD-493.00)    Route: SQ    Site: L deltoid    Exp Date: 08/2011    Lot #: [id number]    Mfr: Mendel Ryder    Comments: Injection given by Dimas Millin in allergy lab. Xolair 150mg . 1.33ml x 1 in Left Deltoid. Pt waited 30 minutes.   Orders Added: 1)  Admin of Therapeutic Inj  intramuscular or subcutaneous [96372] 2)  Xolair (omalizumab) 150mg  [J2357]

## 2010-09-23 NOTE — Assessment & Plan Note (Signed)
Summary: 6 min walk  Nurse Visit   Vital Signs:  Patient profile:   65 year old female Pulse rate:   78 / minute BP sitting:   128 / 80  Allergies: 1)  ! Pcn 2)  ! Iodine 3)  ! Ceclor 4)  ! * Singulair 5)  ! * Sulfa Drugs 6)  ! Zithromax  Orders Added: 1)  Pulmonary Stress (6 min walk) [94620]   Six Minute Walk Test Medications taken before test(dose and time): plavix, cazaar Supplemental oxygen during the test: No  Lap counter(place a tick mark inside a square for each lap completed) lap 1 complete  lap 2 complete   lap 3 complete   lap 4 complete  lap 5 complete  lap 6 complete  lap 7 complete   lap 8 complete    Baseline  BP sitting: 128/ 80 Heart rate: 78 Dyspnea ( Borg scale) 1 Fatigue (Borg scale) 4 SPO2 97 ra  End Of Test  BP sitting: 132/ 84 Heart rate: 83 Dyspnea ( Borg scale) 3 Fatigue (Borg scale) 4 SPO2 98 ra  2 Minutes post  BP sitting: 124/ 78 Heart rate: 64 SPO2 96 ra  Stopped or paused before six minutes? No  Interpretation: Number of laps  8 X 48 meters =   384 meters+ final partial lap: 0 meters =    384 meters   Total distance walked in six minutes: 384 meters  Tech ID: Boone Master CNA/MA (January 29, 2010 9:52 AM) Jeremy Johann Comments pt completed test with no breaks or stops.  test performed by Reynaldo Minium CMA on 01-28-10.  Test information entered by Boone Master MA.

## 2010-09-23 NOTE — Assessment & Plan Note (Signed)
Summary: XOLAIR/MHH   Allergies: 1)  ! Pcn 2)  ! Iodine 3)  ! Ceclor 4)  ! * Singulair 5)  ! * Sulfa Drugs   Complete Medication List: 1)  Advair Diskus 500-50 Mcg/dose Misc (Fluticasone-salmeterol) .Marland Kitchen.. 1 puff two times a day 2)  Claritin 10 Mg Tabs (Loratadine) .Marland Kitchen.. 1 by mouth once daily 3)  Benicar 40 Mg Tabs (Olmesartan medoxomil) .Marland Kitchen.. 1 by mouth once daily 4)  Fish Oil 1000 Mg Caps (Omega-3 fatty acids) .... 4 by mouth once daily 5)  Calcium 500 500 Mg Tabs (Calcium carbonate) .Marland Kitchen.. 1 by mouth two times a day 6)  Cvs Daily Multiple Tabs (Multiple vitamin) .Marland Kitchen.. 1 by mouth once daily 7)  Flonase 50 Mcg/act Susp (Fluticasone propionate) .... 2 sprays each nostril once daily 8)  Crestor 5 Mg Tabs (Rosuvastatin calcium) .... 1/2 every other day 9)  Xolair 150 Mg Solr (Omalizumab) .... Every 4 weeks 10)  Cvs Vitamin B-6 100 Mg Tabs (Pyridoxine hcl) .Marland Kitchen.. 1 by mouth once daily 11)  Nexium 40 Mg Cpdr (Esomeprazole magnesium) .Marland Kitchen.. 1 by mouth once daily 12)  Baby Aspirin 81 Mg Chew (Aspirin) .... Take 1 tablet by mouth two times a day 13)  Albuterol 90 Mcg/act Aers (Albuterol) .... 2 puffs qid prn 14)  Twin Jet-epinephrine  .... Use as directed 15)  Astelin 137 Mcg/spray Soln (Azelastine hcl) .... Take 2 sprays in each nostril twice a day 16)  Albuterol-ipratropium 2.5-0.5 Mg/53ml Soln (Albuterol-ipratropium) .Marland Kitchen.. 1 4 times daily as needed 17)  Plavix 75 Mg Tabs (Clopidogrel bisulfate) .... Take 1 by mouth once daily  Other Orders: Admin of Therapeutic Inj  intramuscular or subcutaneous (09811) Xolair (omalizumab) 150mg  (B1478)       Medication Administration  Injection # 1:    Medication: Xolair (omalizumab) 150mg     Diagnosis: EXTRINSIC ASTHMA, UNSPECIFIED (ICD-493.00)    Route: SQ    Site: R deltoid    Exp Date: 02/20/2012    Lot #: [id number]    Mfr: GENENTECH    Comments: 1.2ML RIGHT ARM PT WAITED 30 MIN    Patient tolerated injection without complications    Given by:  TAMMY SCOTT IN ALLERGY LAB  Orders Added: 1)  Admin of Therapeutic Inj  intramuscular or subcutaneous [96372] 2)  Xolair (omalizumab) 150mg  [J2357]

## 2010-09-23 NOTE — Assessment & Plan Note (Signed)
Summary: XOLAIR/KLW    Current Allergies: ! PCN ! IODINE ! CECLOR ! * SINGULAIR ! * SULFA DRUGS        Complete Medication List: 1)  Advair Diskus 500-50 Mcg/dose Misc (Fluticasone-salmeterol) .Marland Kitchen.. 1 puff two times a day 2)  Claritin 10 Mg Tabs (Loratadine) .Marland Kitchen.. 1 by mouth once daily 3)  Benicar 40 Mg Tabs (Olmesartan medoxomil) .Marland Kitchen.. 1 by mouth once daily 4)  Fish Oil 1000 Mg Caps (Omega-3 fatty acids) .... 4 by mouth once daily 5)  Calcium 500 500 Mg Tabs (Calcium carbonate) .Marland Kitchen.. 1 by mouth two times a day 6)  Cvs Daily Multiple Tabs (Multiple vitamin) .Marland Kitchen.. 1 by mouth once daily 7)  Flonase 50 Mcg/act Susp (Fluticasone propionate) .... 2 sprays each nostril once daily 8)  Crestor 5 Mg Tabs (Rosuvastatin calcium) .... 1/2 every other day 9)  Xolair 150 Mg Solr (Omalizumab) .... Every 4 weeks 10)  Cvs Vitamin B-6 100 Mg Tabs (Pyridoxine hcl) .Marland Kitchen.. 1 by mouth once daily 11)  Nexium 40 Mg Cpdr (Esomeprazole magnesium) .Marland Kitchen.. 1 by mouth once daily 12)  Baby Aspirin 81 Mg Chew (Aspirin) .... Take 1 tablet by mouth two times a day 13)  Albuterol 90 Mcg/act Aers (Albuterol) .... 2 puffs qid prn 14)  Prednisone Taper  .... Use as directed 15)  Temazepam 15 Mg Caps (Temazepam) .... Take 1 capsule by mouth at bedtime as needed 16)  Proventil Inhaler  .... Use as directed 17)  Twin Jet-epinephrine  .... Use as directed 18)  Astelin 137 Mcg/spray Soln (Azelastine hcl) .... Take 2 sprays in each nostril twice a day 19)  Albuterol-ipratropium 2.5-0.5 Mg/73ml Soln (Albuterol-ipratropium) .Marland Kitchen.. 1 4 times daily as needed     ]  Medication Administration  Injection # 1:    Medication: Xolair (omalizumab) 150mg     Diagnosis: ASTHMA (ICD-493.90)    Route: SQ    Site: R deltoid    Exp Date: 11/2010    Lot #: [id number]    Mfr: Mendel Ryder    Comments: Injection given by Dimas Millin in allergy lab. 1.65ml x 1 in Right Deltoid. Pt waited 30 minutes.    Patient tolerated injection without  complications  Orders Added: 1)  Xolair (omalizumab) 150mg  [J2357] 2)  Admin of Therapeutic Inj  intramuscular or subcutaneous [04540]
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
## 2010-09-23 NOTE — Assessment & Plan Note (Signed)
Summary: XOLAIR///kp  Nurse Visit   Allergies: 1)  ! Pcn 2)  ! Iodine 3)  ! Ceclor 4)  ! * Singulair 5)  ! * Sulfa Drugs 6)  ! Zithromax  Medication Administration  Injection # 1:    Medication: Xolair (omalizumab) 150mg     Diagnosis: EXTRINSIC ASTHMA, UNSPECIFIED (ICD-493.00)    Route: SQ    Site: L deltoid    Exp Date: 12/20/2012    Lot #: [id number]    Mfr: GENENTECH    Comments: 1.2ML IN LEFT ARM PT WAITED 30 MINS    Patient tolerated injection without complications    Given by: TAMMY SCOTT IN ALLERGY LAB  Orders Added: 1)  Xolair (omalizumab) 150mg  [J2357] 2)  Administration xolair injection [id number]   Medication Administration  Injection # 1:    Medication: Xolair (omalizumab) 150mg     Diagnosis: EXTRINSIC ASTHMA, UNSPECIFIED (ICD-493.00)    Route: SQ    Site: L deltoid    Exp Date: 12/20/2012    Lot #: [id number]    Mfr: GENENTECH    Comments: 1.2ML IN LEFT ARM PT WAITED 30 MINS    Patient tolerated injection without complications    Given by: TAMMY SCOTT IN ALLERGY LAB  Orders Added: 1)  Xolair (omalizumab) 150mg  [J2357] 2)  Administration xolair injection [81191]

## 2010-09-23 NOTE — Progress Notes (Signed)
Summary: see or speak to dr young today  Phone Note Call from Patient Call back at [facility] Phone [phone]   Caller: Patient Call For: YOUNG Summary of Call: pt wants to speak to dr young asap re: pending surgery. she has an appt thurs but would either like to speak to dr young this am or "be worked in to see him today".  Initial call taken by: Tivis Ringer, CNA,  June 08, 2010 9:04 AM  Follow-up for Phone Call        called and spoke with pt.  pt states she will be having rotator cuff surgery soon and would like to discuss this with Cy.  Pt states she is scheduled to see CY for a f/u ov this thursday 06/10/2010.  However, pt states she cannot wait until Thursday to speak to Cy and requests to speak to him today.  I asked what it was regarding but pt refused to give me any detail. Please advise.  Aundra Millet Reynolds LPN  June 08, 2010 9:27 AM   Additional Follow-up for Phone Call Additional follow up Details #1::        Peak flow is good- 350 She hasn't been using Advair in months  Now pending rotator cuff surgery, she got frightened that she should be on it for strabilization, though she has not had wheezing. Given her hx, I suggested that we put her on Advair 100 until her surgery.  [facility] She wil keep her appointment 2 days from now.  Additional Follow-up by: Waymon Budge MD,  June 08, 2010 12:58 PM    New/Updated Medications: ADVAIR DISKUS 100-50 MCG/DOSE AEPB (FLUTICASONE-SALMETEROL) 1 puff and rinse mouth, twice daily Prescriptions: ADVAIR DISKUS 100-50 MCG/DOSE AEPB (FLUTICASONE-SALMETEROL) 1 puff and rinse mouth, twice daily  #1 x prn   Entered and Authorized by:   Waymon Budge MD   Signed by:   Waymon Budge MD on 06/08/2010   Method used:   [facility]ically to        [facility]  [address] [phone][facility] (retail)       [address]       [place], [place]  [address]       Ph: [phone] or [phone]       Fax: [phone]   RxID:    [id number]

## 2010-09-23 NOTE — Assessment & Plan Note (Signed)
Summary: xolair/apc      Current Allergies: ! PCN ! IODINE ! CECLOR ! * SINGULAIR ! * SULFA DRUGS        Complete Medication List: 1)  Advair Diskus 500-50 Mcg/dose Misc (Fluticasone-salmeterol) .Marland Kitchen.. 1 puff two times a day 2)  Claritin 10 Mg Tabs (Loratadine) .Marland Kitchen.. 1 by mouth once daily 3)  Benicar 40 Mg Tabs (Olmesartan medoxomil) .Marland Kitchen.. 1 by mouth once daily 4)  Fish Oil 1000 Mg Caps (Omega-3 fatty acids) .... 4 by mouth once daily 5)  Calcium 500 500 Mg Tabs (Calcium carbonate) .Marland Kitchen.. 1 by mouth two times a day 6)  [facility] Daily Multiple Tabs (Multiple vitamin) .Marland Kitchen.. 1 by mouth once daily 7)  Flonase 50 Mcg/act Susp (Fluticasone propionate) .... 2 sprays each nostril once daily 8)  Crestor 5 Mg Tabs (Rosuvastatin calcium) .... 1/2 every other day 9)  Xolair 150 Mg Solr (Omalizumab) .... Every 4 weeks 10)  [facility] Vitamin B-6 100 Mg Tabs (Pyridoxine hcl) .Marland Kitchen.. 1 by mouth once daily 11)  Nexium 40 Mg Cpdr (Esomeprazole magnesium) .Marland Kitchen.. 1 by mouth once daily 12)  Baby Aspirin 81 Mg Chew (Aspirin) .... Take 1 tablet by mouth two times a day 13)  Albuterol 90 Mcg/act Aers (Albuterol) .... 2 puffs qid prn 14)  Twin Jet-epinephrine  .... Use as directed 15)  Astelin 137 Mcg/spray Soln (Azelastine hcl) .... Take 2 sprays in each nostril twice a day 16)  Albuterol-ipratropium 2.5-0.5 Mg/105ml Soln (Albuterol-ipratropium) .Marland Kitchen.. 1 4 times daily as needed    ]  Medication Administration  Injection # 1:    Medication: Xolair (omalizumab) 150mg     Diagnosis: EXTRINSIC ASTHMA, UNSPECIFIED (ICD-493.00)    Route: SQ    Site: L deltoid    Exp Date: 08/2011    Lot #: [id number]    Mfr: Mendel Ryder    Comments: Injection given by Dimas Millin in allergy lab. Xolair 150mg . 1.28ml x 1 in Left deltoid. Pt waited 30 minutes.  Orders Added: 1)  Admin of Therapeutic Inj  intramuscular or subcutaneous [96372] 2)  Xolair (omalizumab) 150mg  [J2357]

## 2010-09-23 NOTE — Assessment & Plan Note (Signed)
Summary: xolair//mbw      Current Allergies: ! PCN ! IODINE ! CECLOR ! * SINGULAIR ! * SULFA DRUGS            Medication Administration  Injection # 1:    Medication: Xolair (omalizumab) 150mg     Diagnosis: EXTRINSIC ASTHMA, UNSPECIFIED (ICD-493.00)    Route: SQ    Site: L deltoid    Exp Date: 11/2011    Lot #: [id number]    Mfr: Mendel Ryder    Comments: Injection given by Dimas Millin in allergy lab. Xolair 150mg . 1.70ml x 1 in Left Deltoid. Pt waited 30 minutes.   Orders Added: 1)  Admin of Therapeutic Inj  intramuscular or subcutaneous [96372] 2)  Xolair (omalizumab) 150mg  [J2357]

## 2010-09-23 NOTE — Assessment & Plan Note (Signed)
Summary: 4 MONTH RECK/KLW   Visit Type:  Follow-up PCP:  Shevlin  Chief Complaint:  4 month follow-up visit.  History of Present Illness: Current Problems:  ACUTE MUCOID OTITIS MEDIA (ICD-381.02) GLAUCOMA (ICD-365.9) NEUROPATHY (ICD-355.9) SLEEP DISORDER (ICD-780.50) FIBROMYALGIA (ICD-729.1) MITRAL VALVE PROLAPSE (ICD-424.0) VOCAL CORD DISORDER (ICD-478.5) COPD (ICD-496) EXTRINSIC ASTHMA, UNSPECIFIED (ICD-493.00) ALLERGIC RHINITIS (ICD-477.9)  Debra Byrd can tell when it is time for her  Xolair shot because she gets more short of breath in the last couple of days.  Within a day after her Xolair shots this resolves.  Overall, she feels she is doing much better since she has been using Xolair.  She points out she has only needed a prednisone burst about twice a year where in the past it was every month.  She says her cardiologist also has noticed that she is wheezing much less when she comes to see him.  Her peak flow personal best is between 350 and 400.  Yellow zone is around 250.  She finds her hearing comes and goes.  She complains of a sense of fluid pressure, especially in the left ear, which crackles. I suggested she see her ENT.       Current Allergies: ! PCN ! IODINE ! CECLOR ! * SINGULAIR ! * SULFA DRUGS  Past Medical History:    Reviewed history from 06/04/2007 and no changes required:       Allergic rhinitis       Asthma       COPD   Social History:    Reviewed history and no changes required:       Patient never smoked.    Risk Factors:  Tobacco use:  never   Review of Systems      See HPI   Vital Signs:  Patient Profile:   65 Years Old Female Weight:      162.38 pounds O2 Sat:      99 % O2 treatment:    Room Air Pulse rate:   60 / minute BP sitting:   114 / 70  (left arm)  Vitals Entered By: Cloyde Reams RN (December 05, 2007 11:15 AM)             Comments Pt is here today for a 4 month follow-up visit.  Pt had xolair shot today.  C/O sl  incr in wheezing and hoarseness. Medications reviewed ..................................................................Marland KitchenCloyde Reams RN  December 05, 2007 11:32 AM      Physical Exam  General:     normal appearance.   Eyes:     chronic periorbital edema Ears:     TMs intact and clear with normal canals Specifically the left side looked normal. Nose:     no deformity, discharge, inflammation, or lesions mucosa pale Mouth:     voice a little husky Neck:     no JVD.   Lungs:     mild insp and exp wheeze  decreased BS bilateral. unlabored   Heart:     regular rate and rhythm, S1, S2 without murmurs, rubs, gallops, or clicks Extremities:     no clubbing, cyanosis, edema, or deformity noted Cervical Nodes:     no significant adenopathy Axillary Nodes:     no significant adenopathy     Problem # 1:  EXTRINSIC ASTHMA, UNSPECIFIED (ICD-493.00) Better control- will continue Xolair. Still not sure that some of her wheeze isn't vocal cord dysfunction. Her updated medication list for this problem includes:    Advair Diskus 500-50  Mcg/dose Misc (Fluticasone-salmeterol) .Marland Kitchen... 1 puff two times a day    Xolair 150 Mg Solr (Omalizumab) ..... Every 4 weeks    Albuterol 90 Mcg/act Aers (Albuterol) .Marland Kitchen... 2 puffs qid prn    Albuterol-ipratropium 2.5-0.5 Mg/27ml Soln (Albuterol-ipratropium) .Marland Kitchen... 1 4 times daily as needed  Orders: Est. Patient Level III (16109)   Problem # 2:  COPD (ICD-496) Fixed component consistent with chronic asthma, mainly affecting small airway flow. Her updated medication list for this problem includes:    Advair Diskus 500-50 Mcg/dose Misc (Fluticasone-salmeterol) .Marland Kitchen... 1 puff two times a day    Xolair 150 Mg Solr (Omalizumab) ..... Every 4 weeks    Albuterol 90 Mcg/act Aers (Albuterol) .Marland Kitchen... 2 puffs qid prn    Albuterol-ipratropium 2.5-0.5 Mg/68ml Soln (Albuterol-ipratropium) .Marland Kitchen... 1 4 times daily as needed  Orders: Est. Patient Level III (60454)   Problem  # 3:  EXTRINSIC ASTHMA, UNSPECIFIED (ICD-493.00)  Her updated medication list for this problem includes:    Advair Diskus 500-50 Mcg/dose Misc (Fluticasone-salmeterol) .Marland Kitchen... 1 puff two times a day    Xolair 150 Mg Solr (Omalizumab) ..... Every 4 weeks    Albuterol 90 Mcg/act Aers (Albuterol) .Marland Kitchen... 2 puffs qid prn    Albuterol-ipratropium 2.5-0.5 Mg/42ml Soln (Albuterol-ipratropium) .Marland Kitchen... 1 4 times daily as needed  Orders: Est. Patient Level III (09811)    Patient Instructions: 1)  Please schedule a follow-up appointment in 4 months.    ]  Appended Document: 4 MONTH RECK/KLW-xolair    Clinical Lists Changes  Orders: Added new Service order of Admin of Therapeutic Inj  intramuscular or subcutaneous (91478) - Signed Added new Service order of Xolair (omalizumab) 150mg  (G9562) - Signed        Medication Administration  Injection # 1:    Medication: Xolair (omalizumab) 150mg     Diagnosis: EXTRINSIC ASTHMA, UNSPECIFIED (ICD-493.00)    Route: SQ    Site: L deltoid    Exp Date: 04/2011    Lot #: [id number]    Mfr: Mendel Ryder    Comments: Injection given by Dimas Millin in allergy lab. Xolair 150mg . 1.52ml x 1 in Left Deltoid.  Pt saw Dr. Maple Hudson after injection for OV.    Patient tolerated injection without complications  Orders Added: 1)  Admin of Therapeutic Inj  intramuscular or subcutaneous [96372] 2)  Xolair (omalizumab) 150mg  [J2357]

## 2010-09-24 NOTE — Miscellaneous (Signed)
Summary: Injection Record / [facility]    Injection Record /  Allergy    Imported By: Lennie Odor 01/11/2010 15:33:37  _____________________________________________________________________  External Attachment:    Type:   Image     Comment:   External Document

## 2010-09-29 NOTE — Assessment & Plan Note (Signed)
Summary: xolair/jd  Nurse Visit   Allergies: 1)  ! Pcn 2)  ! Iodine 3)  ! Ceclor 4)  ! * Singulair 5)  ! * Sulfa Drugs 6)  ! Zithromax 7)  ! Levaquin  Medication Administration  Injection # 1:    Medication: Xolair (omalizumab) 150mg     Diagnosis: EXTRINSIC ASTHMA, UNSPECIFIED (ICD-493.00)    Route: SQ    Site: L thigh    Exp Date: 08/2013    Lot #: [id number]    Mfr: Genetech    Comments: 1.2 ML IN LEFT HIP 150MG  CHARGED [id number] AND 78295    Given by: Dimas Millin IN ALLERGY LAB  Orders Added: 1)  Xolair (omalizumab) 150mg  [J2357] 2)  Administration xolair injection [62130]   Medication Administration  Injection # 1:    Medication: Xolair (omalizumab) 150mg     Diagnosis: EXTRINSIC ASTHMA, UNSPECIFIED (ICD-493.00)    Route: SQ    Site: L thigh    Exp Date: 08/2013    Lot #: [id number]    Mfr: [employer]    Comments: 1.2 ML IN LEFT HIP 150MG  CHARGED [id number] AND 69629    Given by: Dimas Millin IN ALLERGY LAB  Orders Added: 1)  Xolair (omalizumab) 150mg  [J2357] 2)  Administration xolair injection [52841]

## 2010-10-20 ENCOUNTER — Encounter: Payer: Self-pay | Admitting: Internal Medicine

## 2010-10-20 ENCOUNTER — Ambulatory Visit (INDEPENDENT_AMBULATORY_CARE_PROVIDER_SITE_OTHER): Payer: Medicare Other

## 2010-10-20 DIAGNOSIS — J45909 Unspecified asthma, uncomplicated: Secondary | ICD-10-CM

## 2010-10-28 NOTE — Assessment & Plan Note (Signed)
Summary: Geoffry Paradise ///kp  Nurse Visit   Allergies: 1)  ! Pcn 2)  ! Iodine 3)  ! Ceclor 4)  ! * Singulair 5)  ! * Sulfa Drugs 6)  ! Zithromax 7)  ! Levaquin  Medication Administration  Injection # 1:    Medication: Xolair (omalizumab) 150mg     Diagnosis: EXTRINSIC ASTHMA, UNSPECIFIED (ICD-493.00)    Route: SQ    Site: R deltoid    Lot #: [id number]    Mfr: [employer]et[employer]    Comments: 1.2 ML IN RIGHT ARM CHARGED [id number] AND  04540    Given by: Dimas Millin IN ALLERGY LAB  Orders Added: 1)  Xolair (omalizumab) 150mg  [J2357] 2)  Administration xolair injection [id number]   Medication Administration  Injection # 1:    Medication: Xolair (omalizumab) 150mg     Diagnosis: EXTRINSIC ASTHMA, UNSPECIFIED (ICD-493.00)    Route: SQ    Site: R deltoid    Lot #: [id number]    Mfr: [employer]etech    Comments: 1.2 ML IN RIGHT ARM CHARGED [id number] AND  98119    Given by: Babette Relic SCOTT IN ALLERGY LAB  Orders Added: 1)  Xolair (omalizumab) 150mg  [J2357] 2)  Administration xolair injection [14782]

## 2010-11-18 ENCOUNTER — Ambulatory Visit (INDEPENDENT_AMBULATORY_CARE_PROVIDER_SITE_OTHER): Payer: Medicare Other

## 2010-11-18 DIAGNOSIS — J45909 Unspecified asthma, uncomplicated: Secondary | ICD-10-CM

## 2010-11-19 MED ORDER — OMALIZUMAB 150 MG ~~LOC~~ SOLR
150.0000 mg | Freq: Once | SUBCUTANEOUS | Status: AC
  Administered 2010-11-18: 150 mg via SUBCUTANEOUS

## 2010-12-06 ENCOUNTER — Encounter: Payer: Self-pay | Admitting: Internal Medicine

## 2010-12-07 ENCOUNTER — Encounter: Payer: Self-pay | Admitting: Internal Medicine

## 2010-12-07 ENCOUNTER — Ambulatory Visit (INDEPENDENT_AMBULATORY_CARE_PROVIDER_SITE_OTHER): Payer: Medicare Other | Admitting: Internal Medicine

## 2010-12-07 VITALS — BP 124/78 | HR 59 | Ht 62.0 in | Wt 161.0 lb

## 2010-12-07 DIAGNOSIS — J309 Allergic rhinitis, unspecified: Secondary | ICD-10-CM

## 2010-12-07 DIAGNOSIS — J449 Chronic obstructive pulmonary disease, unspecified: Secondary | ICD-10-CM

## 2010-12-07 MED ORDER — ALBUTEROL SULFATE HFA 108 (90 BASE) MCG/ACT IN AERS: 2.0000 | INHALATION_SPRAY | Freq: Four times a day (QID) | RESPIRATORY_TRACT | Status: DC | PRN

## 2010-12-07 MED ORDER — CLARITHROMYCIN 500 MG PO TABS: 500.0000 mg | ORAL_TABLET | Freq: Two times a day (BID) | ORAL | Status: DC

## 2010-12-07 MED ORDER — PREDNISONE 10 MG PO TABS: ORAL_TABLET | ORAL | Status: DC

## 2010-12-07 NOTE — Assessment & Plan Note (Addendum)
Xolair has definitely helped.  She is a bit anxious, but admits that overall she has been much better with Xolair. For now I will have her try increasing her Advair to 250

## 2010-12-07 NOTE — Progress Notes (Signed)
  Subjective:    Patient ID: Debra Byrd, female    DOB: [birth year], 65 y.o.   MRN: [id number]  HPI 43 yoF never smoker followed for hx of chronic obstructive asthma which has often been steroid dependent, complicated by allergic rhinitis, suspected vocal cord dysfunction syndrome. Last here- October 20/2011 for preop assessment. She ended up deferring shoulder surgeries because of Orthopedist's cautions due to her asthma. She got a second opinion at [facility] and has improved with Physical Therapy, so surgery is not planned now.  She started herself on antibiotic and prednisone in February for sinusitis and f/u w/ Dr Benedetto Goad- resolved. March 17 had difficulty getting deep breath. Went to [place] [facility]- had CXR,  Leg Dopplers, Stress test. Finally dx'd "pleurisy". A neb helped there, suggesting some mild asthma exacerbation w/o overt bronchospasm. Uses rescue inhaler 1-3x/ week. She used it today walking from parking lot.   Continues Xolair injections, credited for significant improvement. Review of Systems  Constitutional: Negative for fever and activity change.  HENT: Negative for ear pain, nosebleeds, congestion, rhinorrhea, sneezing and postnasal drip.   Eyes: Negative for discharge and itching.  Respiratory: Positive for shortness of breath and wheezing. Negative for stridor.   Cardiovascular: Negative for chest pain, palpitations and leg swelling.  All other systems reviewed and are negative.   See HPI     Objective:   Physical Exam General- Alert, Oriented, Affect-appropriate, Distress- none acute  Skin- rash-none, lesions- none, excoriation- none  Lymphadenopathy- none  Head- atraumatic  Eyes- Gross vision intact, PERRLA, conjunctivae clear secretions  Ears-  Normal-Hearing, canals, Tm L , R ,  Nose- Clear, No-Septal dev, mucus, polyps, erosion, perforation   Throat- Mallampati II , mucosa clear , drainage- none, tonsils- atrophic  Neck- flexible , trachea midline, no  stridor , thyroid nl, carotid no bruit  Chest - symmetrical excursion , unlabored     Heart/CV- RRR , no murmur , no gallop  , no rub, nl s1 s2                     - JVD- none , edema- none, stasis changes- none, varices- none     Lung- clear to P&A, wheeze- none, cough- none , dullness-none, rub- none     Chest wall- Abd- tender-no, distended-no, bowel sounds-present, HSM- no  Br/ Gen/ Rectal- Not done, not indicated  Extrem- cyanosis- none, clubbing, none, atrophy- none, strength- nl  Neuro- grossly intact to observation         Assessment & Plan:

## 2010-12-07 NOTE — Patient Instructions (Signed)
Sample Advair 250/50-   Use this and the spares you have at home to take 1 puff then rinse mouth, twice daily. After the pollen season you can drop back to the Advair 100.   Scripts to refill Ventolin, biaxin and prednisone.

## 2010-12-12 ENCOUNTER — Encounter: Payer: Self-pay | Admitting: Internal Medicine

## 2010-12-12 NOTE — Assessment & Plan Note (Signed)
Currently controlled.

## 2010-12-17 ENCOUNTER — Ambulatory Visit (INDEPENDENT_AMBULATORY_CARE_PROVIDER_SITE_OTHER): Payer: Medicare Other

## 2010-12-17 DIAGNOSIS — J45909 Unspecified asthma, uncomplicated: Secondary | ICD-10-CM

## 2010-12-17 MED ORDER — OMALIZUMAB 150 MG ~~LOC~~ SOLR
150.0000 mg | Freq: Once | SUBCUTANEOUS | Status: AC
  Administered 2010-12-17: 150 mg via SUBCUTANEOUS

## 2011-01-04 NOTE — Assessment & Plan Note (Signed)
[facility]                             PULMONARY OFFICE NOTE   Debra Byrd, Debra Byrd                        MRN:          [id number]  DATE:04/09/2007                            DOB:          08-Oct-1945    PROBLEM LIST:  1. Asthma/chronic obstructive pulmonary disease.  2. 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  3. Mitral valve prolapse.  4. Fibromyalgia.  5. Allergic rhinitis/chronic steroid therapy.  6. Sleep disordered breathing, periodic limb movement.  7. Neuropathy.  8. Glaucoma.   HISTORY:  Does not feel well. She thinks Xolair is helping her breathing  and she has had a better week overall. She notices some hair loss and  has found an online list indicating that can be a side effect of Xolair.  She has also had pain in the left breast and is pending mammography and  ultrasound noting that breast cancer has also been reported with Xolair.  She is following with her OB/GYN for this. She has Biaxin and prednisone  prescriptions to hold.   MEDICATIONS:  1. Advair 500/50 one puff b.i.d.  2. Claritin.  3. Benicar.  4. Omacor.  5. Calcium.  6. Multivitamin.  7. Flonase.  8. Crestor 5 mg x1/2 every other day.  9. Xolair 150 mg every 4 weeks.  10.B6.  11.Nexium.  12.Aspirin 81 mg.  13.Albuterol MDI or by nebulizer p.r.n.  14.Prednisone taper.  15.Temazepam.  16.Proventil.  17.Epinephrine available.   DRUG INTOLERANCES:  PENICILLIN, IODINE, CECLOR, SINGULAIR, SULFA DRUGS.   OBJECTIVE:  VITAL SIGNS: Weight 162 pounds, blood pressure 116/68, pulse  67, room air saturations 98%.  HEENT:  Puffy under the eyes. Nose and throat are otherwise  unremarkable. Voice quality is normal.  GENERAL:  She seems calm.  HEART:  Sounds regular without murmur. I find no adenopathy.  LUNGS:  Very clear specifically with no wheeze, rales, rhonchi or  dullness.  EXTREMITIES:  Without clubbing or edema.   IMPRESSION:  1. Asthma/chronic obstructive pulmonary  disease component under good      control.  2. Question of Xolair side effects or other explanation for hair loss      and breast pain.   PLAN:  1. We refilled her prednisone 5 mg tabs to have available as      directed.  2. Biaxin prescription to hold 500 mg b.i.d. for 7 days.  3. We refilled her EpiPen.  4. Scheduled return in 4 months, earlier p.r.n.     Clinton D. Maple Hudson, MD, Tonny Bollman, FACP  Electronically Signed    CDY/MedQ  DD: 04/10/2007  DT: 04/11/2007  Job #: [id number]   cc:   Elease Hashimoto A. Benedetto Goad, M.D.

## 2011-01-07 NOTE — Assessment & Plan Note (Signed)
[name]                             PULMONARY OFFICE NOTE   Debra Byrd, Debra Byrd                        MRN:          [id number]  DATE:08/03/2006                            DOB:          27-Apr-1946    PROBLEMS:  1. Asthma/chronic obstructive pulmonary disease.  2. 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  3. Mitral valve prolapse.  4. Fibromyalgia.  5. Allergic rhinitis/chronic steroid therapy.  6. Sleep disordered breathing, periodic limb movement.  7. Neuropathy.  8. Glaucoma.   HISTORY:  She has continued Xolair at 150 mg once a month.  Currently  she feels stable.  She got a cortisone injection in her hand two weeks  ago which she says helped her wheezing at the time.  She awoke with a  bit of wheeze this morning.  There has been no purulent sputum,  headache, or significant nasal congestion.  No palpitations or edema.   MEDICATIONS:  1. Advair 500/50.  2. Claritin.  3. Benicar.  4. Omocar.  5. Calcium.  6. Multivitamin.  7. Flonase.  8. Xolair 150 mg injected every four weeks.  9. Elavil.  10.Nexium.  11.Home nebulizer with albuterol.  12.Frequent prednisone.  13.Proventil Twinject 0.3 mg.  14.Epinephrine.   DRUG INTOLERANCE:  1. PENICILLIN.  2. IODINE.  3. CECLOR.  4. SINGULAIR.   OBJECTIVE:  VITAL SIGNS:  Weight 177 pounds, BP 140/92, pulse regular  61, room air saturation 100%.  GENERAL:  She looks less distressed today than many visits.  RESPIRATORY:  There is some dry wheezy cough with forced expiration,  shallow inspiratory effort without dullness.  HEART:  Sounds are regular.  I do not hear a murmur or gallop.  She  clears her throat frequently but I see no redness or drainage.  There is  no stridor.  No adenopathy.  No evident post nasal drip.   PFT:  Normal lung volume, mild obstructive airway disease with response  to bronchodilator, normal diffusion.  Her FEV1, after bronchodilator,  was 1.62 (80%), FVC 2.3 (84%),  ratio 0.71.   IMPRESSION:  For all of the trouble she has had, her lung function is  quite well preserved which has reinforced my question of whether some of  this is vocal cord dysfunction.  She does have a tendency towards  recurrent allergic rhinitis.  Xolair seems to be helping and is  continued.  No acute intervention is needed.   PLAN:  She is encouraged to walk regularly to maintain endurance, keep  her weight down, and maintain some bone strength.  Medicines continue.  Schedule return 4 months, earlier p.r.n.     Clinton D. Maple Hudson, MD, Tonny Bollman, FACP  Electronically Signed    CDY/MedQ  DD: 08/05/2006  DT: 08/05/2006  Job #: [id number]   cc:   Elease Hashimoto A. Benedetto Goad, M.D.

## 2011-01-07 NOTE — Procedures (Signed)
Debra Byrd, Debra Byrd                 ACCOUNT NO.:  [id number]   MEDICAL RECORD NO.:  [id number]          PATIENT TYPE:  OUT   LOCATION:  SLEEP CENTER                 FACILITY:  [facility]   PHYSICIAN:  Clinton D. Maple Hudson, M.D. DATE OF BIRTH:  December 13, 1945   DATE OF STUDY:  11/01/2004                              NOCTURNAL POLYSOMNOGRAM   REFERRING PHYSICIAN:  Clinton D. Maple Hudson, M.D.   INDICATIONS FOR STUDY:  Insomnia with sleep apnea syndrome. Epworth  sleepiness score 1/24, BMI 32, weight 177 pounds.   SLEEP ARCHITECTURE:  Short total sleep time of 243 minutes with sleep  efficiency of 61%. Stage I was 9%, stage II was 55%, stages III and IV were  23%, REM was 13% of total sleep time. Latency to sleep onset 17 minutes.  Latency to REM 244 minutes. Awake after sleep onset 137 minutes. Arousal  index increased at 43. She states she was aware of the patient buzzing the  front desk, telephone ringing, room temperature, incidental disturbances,  and she considers herself a light sleeper.  No sleep medications were taken.   RESPIRATORY DATA:  Respiratory disturbance index (RDI) 6.4 per hour  indicating mild obstructive sleep apnea/hypopnea syndrome.  This included 26  hypopneas. The events were not positional. REM RDI is 13.   OXYGEN DATA:  Mild snoring with oxygen desaturation to a nadir of 90%. Mean  oxygen saturation through the study was 97% on room air.   CARDIAC DATA:  Normal sinus rhythm.   MOVEMENT/PARASOMNIA:  A total 119 limb jerks were recorded of which 53 were  associated with arousal or awakenings for a periodic limb movement with  arousal index increased at 13.1 per hour.   IMPRESSION/RECOMMENDATIONS:  1.  Mild obstructive sleep apnea/hypopnea syndrome, RDI 6.4 per hour      reflecting only hypopneas with normal oxygenation and mild snoring.  2.  Periodic limb movement syndrome with arousal of 13.1 per hour which is      abnormal and may justify specific therapy.  3.   Hypervigilance with awareness of environmental noise affecting her      sleep.  This may justify treatment as insomnia. It may also reflect use      of stimulant bronchodilator therapy or asthma.      CDY/MEDQ  D:  11/07/2004 10:38:11  T:  11/08/2004 09:39:29  Job:  [id number]

## 2011-01-07 NOTE — Assessment & Plan Note (Signed)
[facility]                             PULMONARY OFFICE NOTE   Debra Byrd, Debra Byrd                        MRN:          [id number]  DATE:12/01/2006                            DOB:          1946/01/03    PULMONARY OFFICE FOLLOWUP   PROBLEMS:  1. Asthma/chronic obstructive pulmonary disease.  2. 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  3. Mitral valve prolapse.  4. Fibromyalgia.  5. Allergic rhinitis/chronic steroid therapy.  6. Sleep disordered breathing, periodic limb movement.  7. Neuropathy.  8. Glaucoma.   HISTORY:  She has been on Xolair since June and says it helps her  allergy complaints.  She considers her lungs pretty good today.  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  Today, she questions sinus infection with nasal congestion, itching and  scratching of the left greater than the right eye.  Nothing purulent.  No real headache.   MEDICATIONS:  1. Advair 500/50.  2. Claritin.  3. Benicar.  4. Fish oil.  5. Calcium.  6. Multivitamins.  7. Flonase.  8. Xolair 150 mg every 4 weeks.  9. Nexium.  10.Aspirin 81 mg b.i.d.  11.Albuterol inhaler and albuterol 0.083 by nebulizer.  12.Proventil inhaler.  13.She has a Twinject 0.3 mg epinephrine injector.   OBJECTIVE:  Weight 169 pounds, temperature 97.6, BP 124/80, pulse  regular 64, room air saturation 100%.  Pale boggy turbinates.  Mild conjunctival injection and periorbital  edema.  Bilateral expiratory wheeze.   IMPRESSION:  Exacerbation of allergic rhinitis, allergic conjunctivitis,  and stable control of chronic asthma.   PLAN:  1. She is given sample Pataday 1 drop each eye daily.  2. Okay to use Lacri-Lube ointment.  3. She is given the phone number for [facility],      and will call [facility] to ask what they have, but I have      encouraged her to maintain some physical activity.  4. Biaxin 500 mg b.i.d. for 10  days.  5. Refilled Flonase, claritin, and Proventil HFA.  6. Schedule return in 4 months, earlier p.r.n.     Clinton D. Maple Hudson, MD, Tonny Bollman, FACP  Electronically Signed    CDY/MedQ  DD: 12/02/2006  DT: 12/02/2006  Job #: [id number]   cc:   Elease Hashimoto A. Benedetto Goad, M.D.

## 2011-01-07 NOTE — Assessment & Plan Note (Signed)
[facility]                               PULMONARY OFFICE NOTE   Debra Byrd, Debra Byrd                        MRN:          [id number]  DATE:05/08/2006                            DOB:          01-25-1946    PROBLEM:  1. Asthma/chronic obstructive pulmonary disease.  2. 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  3. Mitral valve prolapse.  4. Fibromyalgia.  5. Allergic rhinitis/chronic steroid therapy.  6. Sleep disordered breathing/periodic limb movement.  7. Neuropathy.  8. Glaucoma.   HISTORY:  She continues with Xolair injections 150 mg every 4 weeks.  She  thinks this is helping, especially with rhinitis symptoms.  She says last  night was a bad night with asthma, blaming weather and ragweed.  She had  taken prednisone and Levaquin for sinusitis and wheezing in late July.  We  talked about previous CT scans of her sinuses which she describes as showing  scar tissue.  Her last prednisone was in July.  She had never been able to  tolerate effective doses of allergy vaccine without local reaction.   MEDICATION:  1. Advair 500/50.  2. Claritin.  3. Benicar.  4. [place]or.  5. Calcium.  6. Multivitamins.  7. Flonase.  8. Prevacid 30 mg.  9. Crestor 5 mg times 1/2 every other day.  10.Xolair 150 mg q.4 weeks.  11.Elavil.  12.Vitamin B6.  13.Albuterol by nebulizer.  14.Prednisone p.r.n.  15.Temazepam.  16.Proventil.  17.She has a Twinject 0.3 mg epinephrine injector.   DRUG INTOLERANT TO:  1. PENICILLIN.  2. IODINE CONTRAST DYE.  3. CECLOR.  4. SINGULAIR.  5. SULFA DRUGS.   OBJECTIVE:  Weight 174 pounds, BP 128/82, pulse regular 66, room air  saturation 97%.  There is obvious wheeze today without stridor, important  because I have questioned how much of her problem was really vocal cord  dysfunction.  She has periorbital edema, which is chronic, with mild nasal  congestion, no visible discharge, no pharyngeal erythema, neck vein  distention  or stridor.  Heart sounds are regular without murmur or gallop.  There is no peripheral edema, cyanosis, clubbing or tremor.   IMPRESSION:  1. Exacerbation of asthma with chronic obstructive pulmonary disease.  2. Rhinitis.  3. Possible chronic sinusitis.   PLAN:  1. She is given nebulizer treatment Xopenex 1.25 mg while here.  2. Prednisone eight day taper from 40 mg.  3. Depo-Medrol 80 mg IM.  4. She will continue Xolair.  5. Avelox 400 mg daily for 7 days.  6. We are ordering a chest x-ray and PFT.  7. We talked briefly about whether she might be able to tolerate allergy      vaccine while her IgE is blunted by Xolair.  That will be considered      going forward, planning return in 2-3 months or as needed.                                   Clinton D. Maple Hudson, MD, FCCP, FACP  CDY/MedQ  DD:  05/13/2006  DT:  05/16/2006  Job #:  [id number]   cc:   Elease Hashimoto A. Benedetto Goad, M.D.

## 2011-01-07 NOTE — Op Note (Signed)
[facility]  Patient:    Debra Byrd, Debra Byrd                        MRN: [id number] Adm. Date:  [id number] Attending:  Randolm Idol                           Operative Report  PREOPERATIVE DIAGNOSES: 1. 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 2. Degenerative joint disease of acromioclavicular joint.  POSTOPERATIVE DIAGNOSES: 1. Extensive partial tearing of infra and supraspinatus tendons right    shoulder with impingement. 2. Degenerative joint disease acromioclavicular joint.  PROCEDURES: 1. Repair of partial rotator cuff tears right shoulder with acromioplasty. 2. Resection distal clavicle.  SURGEON:  Claude Manges. Cleophas Dunker, M.D.  ASSISTANT:  Jamelle Rushing, P.A.C.  ANESTHESIA:  General orotracheal.  COMPLICATIONS:  None.  HISTORY OF PRESENT ILLNESS:  This 65 year old female with history of severe asthma and fibromyalgia and atypical chest pain has been experiencing pain in her right shoulder for many months. She has been treated by conservative means without much relief of her pain. She has had an MRI scan that revealed a complete tear of the supraspinatus tendon from its attachment to the humeral head associated with impingement and degenerative changes at the [facility] joint. She has elected to proceed with surgical intervention because of persistent pain. She does have a history of fibromyalgia which may complicate her pain picture. She has been evaluated by Clinton D. Young, M.D. from a pulmonary standpoint and Dr. Dulce Sellar in [facility] from a cardiovascular standpoint. It was felt that she would tolerate surgery. She is to have operative intervention today.  PROCEDURE:  The patient comfortable on the operating table. Under general orotracheal anesthesia performed by Judie Petit, M.D., the patient was placed in a semi-sitting position. There were no complications during induction with her asthma. She was  placed in a semi-sitting position with a shoulder frame. The right shoulder was then prepped with DuraPrep from the base of the neck to below the elbow with DuraPrep. Sterile draping was performed.  A slightly oblique incision was made beginning behind the [facility] joint crossing anteriorly and by sharp dissection carried down to subcutaneous tissue. Small bleeders are Bovie coagulated. There was abundant adipose tissue. Self-retaining retractor was inserted. The [facility] joint capsule was entered with a Bovie and then the incision extended along the deltoid fascia approximately an inch. Self-retaining retractors were inserted further distally. There was a small amount of bursal fluid identified.  The distal clavicle was consistent with chondromalacia and degenerative changes, eburnated bone, large areas of articular cartilage loss. Distal clavicle resection was performed using an oscillating saw removing approximately 8 to 10 mm of bone. Bone wax was applied to the bleeding bone surface. There was no evidence of impingement near the remaining clavicle.  An anteroinferior acromioplasty was then performed. There was considerable overgrowth of the acromion anteriorly and obvious impingement.  Bone wax was applied to the bleeding bone surface. As I placed the rotator cuff through a range of motion, I did not see an obvious tear. Based on the MRI scan, I then performed a saline acceptance test and injected approximately 25 cc of sterile saline with a 22-gauge needle, and I did not see any leakage. Because there was an area of "blister" formation beneath the rotator cuff at the level of the anterior acromion, I made about a 1.5  cm incision and found extensive partial tearing of the rotator cuff. This was sharply debrided. I also thought there was a partial detachment of the rotator cuff to the humeral head on the joint surface. Accordingly, I made a small bony trough and then fixed the cuff to the head  with a single Mitek anchor and supplemented this with 0 Ethibond suture. There was no impingement at this point.  The wound was totally irrigated with saline solution. The deltoid fascia closed anatomically with a running 0 Vicryl. Subcutaneous tissue was closed with 2-0 Vicryl. Skin was closed with skin clips. Xylocaine 1% without epinephrine was injected into the wound edges. I inserted a Marcaine pump for postop pain control.  The patient was then extubated without any difficulty and returned to the post anesthesia recovery room in satisfactory condition. DD:  04/20/00 TD:  04/21/00 Job: [id number]

## 2011-01-18 ENCOUNTER — Ambulatory Visit (INDEPENDENT_AMBULATORY_CARE_PROVIDER_SITE_OTHER): Payer: Medicare Other

## 2011-01-18 ENCOUNTER — Ambulatory Visit: Payer: Medicare Other

## 2011-01-18 DIAGNOSIS — J45909 Unspecified asthma, uncomplicated: Secondary | ICD-10-CM

## 2011-01-18 MED ORDER — OMALIZUMAB 150 MG ~~LOC~~ SOLR
150.0000 mg | Freq: Once | SUBCUTANEOUS | Status: AC
  Administered 2011-01-18: 150 mg via SUBCUTANEOUS

## 2011-02-16 ENCOUNTER — Ambulatory Visit (INDEPENDENT_AMBULATORY_CARE_PROVIDER_SITE_OTHER): Payer: TRICARE For Life (TFL)

## 2011-02-16 DIAGNOSIS — J45909 Unspecified asthma, uncomplicated: Secondary | ICD-10-CM

## 2011-02-16 MED ORDER — OMALIZUMAB 150 MG ~~LOC~~ SOLR
150.0000 mg | Freq: Once | SUBCUTANEOUS | Status: AC
  Administered 2011-02-16: 150 mg via SUBCUTANEOUS

## 2011-03-16 ENCOUNTER — Ambulatory Visit (INDEPENDENT_AMBULATORY_CARE_PROVIDER_SITE_OTHER): Payer: Medicare Other

## 2011-03-16 ENCOUNTER — Ambulatory Visit (INDEPENDENT_AMBULATORY_CARE_PROVIDER_SITE_OTHER): Payer: TRICARE For Life (TFL)

## 2011-03-16 DIAGNOSIS — J45909 Unspecified asthma, uncomplicated: Secondary | ICD-10-CM

## 2011-03-16 MED ORDER — OMALIZUMAB 150 MG ~~LOC~~ SOLR
150.0000 mg | Freq: Once | SUBCUTANEOUS | Status: AC
  Administered 2011-03-16: 150 mg via SUBCUTANEOUS

## 2011-04-14 ENCOUNTER — Ambulatory Visit (INDEPENDENT_AMBULATORY_CARE_PROVIDER_SITE_OTHER): Payer: Medicare Other

## 2011-04-14 DIAGNOSIS — J45909 Unspecified asthma, uncomplicated: Secondary | ICD-10-CM

## 2011-04-15 MED ORDER — OMALIZUMAB 150 MG ~~LOC~~ SOLR
150.0000 mg | Freq: Once | SUBCUTANEOUS | Status: AC
  Administered 2011-04-15: 150 mg via SUBCUTANEOUS

## 2011-05-12 ENCOUNTER — Ambulatory Visit (INDEPENDENT_AMBULATORY_CARE_PROVIDER_SITE_OTHER): Payer: Medicare Other

## 2011-05-12 DIAGNOSIS — J45909 Unspecified asthma, uncomplicated: Secondary | ICD-10-CM

## 2011-05-13 DIAGNOSIS — J45909 Unspecified asthma, uncomplicated: Secondary | ICD-10-CM

## 2011-05-13 MED ORDER — OMALIZUMAB 150 MG ~~LOC~~ SOLR
150.0000 mg | Freq: Once | SUBCUTANEOUS | Status: AC
  Administered 2011-05-13: 150 mg via SUBCUTANEOUS

## 2011-05-30 ENCOUNTER — Ambulatory Visit (INDEPENDENT_AMBULATORY_CARE_PROVIDER_SITE_OTHER): Payer: Medicare Other

## 2011-05-30 DIAGNOSIS — Z23 Encounter for immunization: Secondary | ICD-10-CM

## 2011-06-08 ENCOUNTER — Ambulatory Visit: Payer: Medicare Other | Admitting: Internal Medicine

## 2011-06-10 ENCOUNTER — Ambulatory Visit: Payer: Medicare Other

## 2011-06-10 ENCOUNTER — Ambulatory Visit (INDEPENDENT_AMBULATORY_CARE_PROVIDER_SITE_OTHER): Payer: Medicare Other

## 2011-06-10 DIAGNOSIS — J45909 Unspecified asthma, uncomplicated: Secondary | ICD-10-CM

## 2011-06-13 DIAGNOSIS — J45909 Unspecified asthma, uncomplicated: Secondary | ICD-10-CM

## 2011-06-13 MED ORDER — OMALIZUMAB 150 MG ~~LOC~~ SOLR
150.0000 mg | Freq: Once | SUBCUTANEOUS | Status: AC
  Administered 2011-06-13: 150 mg via SUBCUTANEOUS

## 2011-06-28 ENCOUNTER — Encounter: Payer: Self-pay | Admitting: Internal Medicine

## 2011-06-28 ENCOUNTER — Ambulatory Visit (INDEPENDENT_AMBULATORY_CARE_PROVIDER_SITE_OTHER): Payer: Medicare Other | Admitting: Internal Medicine

## 2011-06-28 VITALS — BP 128/80 | HR 55 | Ht 61.5 in | Wt 164.4 lb

## 2011-06-28 DIAGNOSIS — J4489 Other specified chronic obstructive pulmonary disease: Secondary | ICD-10-CM

## 2011-06-28 DIAGNOSIS — J383 Other diseases of vocal cords: Secondary | ICD-10-CM

## 2011-06-28 DIAGNOSIS — J449 Chronic obstructive pulmonary disease, unspecified: Secondary | ICD-10-CM

## 2011-06-28 MED ORDER — IPRATROPIUM-ALBUTEROL 0.5-2.5 (3) MG/3ML IN SOLN: 3.0000 mL | RESPIRATORY_TRACT | Status: DC | PRN

## 2011-06-28 MED ORDER — FLUTICASONE-SALMETEROL 250-50 MCG/DOSE IN AEPB: 1.0000 | INHALATION_SPRAY | Freq: Two times a day (BID) | RESPIRATORY_TRACT | Status: DC

## 2011-06-28 NOTE — Progress Notes (Signed)
Subjective:    Patient ID: Debra Byrd, female    DOB: 1945-10-28, 65 y.o.   MRN: [id number]  HPI 68 yoF never smoker followed for hx of chronic obstructive asthma which has often been steroid dependent, complicated by allergic rhinitis, suspected vocal cord dysfunction syndrome. Last here- October 20/2011 for preop assessment. She ended up deferring shoulder surgeries because of Orthopedist's cautions due to her asthma. She got a second opinion at [facility] and has improved with Physical Therapy, so surgery is not planned now.  She started herself on antibiotic and prednisone in February for sinusitis and f/u w/ Dr Benedetto Goad- resolved. March 17 had difficulty getting deep breath. Went to [place] ER- had CXR,  Leg Dopplers, Stress test. Finally dx'd "pleurisy". A neb helped there, suggesting some mild asthma exacerbation w/o overt bronchospasm. Uses rescue inhaler 1-3x/ week. She used it today walking from parking lot.   Continues Xolair injections, credited for significant improvement. Diagnosed with meningioma.  06/28/11- 64 yoF never smoker followed for hx of chronic obstructive asthma which has often been steroid dependent, complicated by allergic rhinitis, suspected vocal cord dysfunction syndrome.  She continues Xolair injections and her Advair 100. At times she thinks she needs stronger Advair and we discussed this. She has definitely felt more stable in the last few years on Xolair. No prednisone since early summer when Dr.Shevlin gave it for allergic conjunctivitis. She did did have an episode of sinusitis early in the fall treated at an urgent care.  ROS-see HPI Constitutional:   No-   weight loss, night sweats, fevers, chills, fatigue, lassitude. HEENT:   No-   difficulty swallowing, tooth/dental problems, sore throat,  + L frontal facial pain- neurologist calls it "atypical facial pain"      No-  sneezing, itching, ear ache, nasal congestion, post nasal drip,  CV:  No-   chest pain,  orthopnea, PND, swelling in lower extremities, anasarca, dizziness, palpitations Resp: No-   shortness of breath with exertion or at rest.              No-   productive cough,  No non-productive cough,  No- coughing up of blood.              No-   change in color of mucus.  No- wheezing.   Skin: No-   rash or lesions. GI:  No-   heartburn, indigestion, abdominal pain, nausea, vomiting, diarrhea,                 change in bowel habits, loss of appetite GU: No-   dysuria, change in color of urine, no urgency or frequency.  No- flank pain. MS:  No-   joint pain or swelling.  No- decreased range of motion.  No- back pain. Neuro-     nothing unusual Psych:  No- change in mood or affect. No depression or anxiety.  No memory loss.      Objective:   Physical Exam General- Alert, Oriented, Affect-appropriate, Distress- none acute; overweight Skin- rash-none, lesions- none, excoriation- none Lymphadenopathy- none Head- atraumatic            Eyes- Gross vision intact, PERRLA, conjunctivae clear secretions. Chronic. Orbital edema.            Ears- Hearing, canals-normal            Nose- Clear, no-Septal dev, mucus, polyps, erosion, perforation             Throat- Mallampati II , mucosa clear ,  drainage- none, tonsils- atrophic Neck- flexible , trachea midline, no stridor , thyroid nl, carotid no bruit Chest - symmetrical excursion , unlabored           Heart/CV- RRR , no murmur , no gallop  , no rub, nl s1 s2                           - JVD- none , edema- none, stasis changes- none, varices- none           Lung- upper airway stridorous wheeze, unlabored. Chest is clear., cough- none , dullness-none, rub- none           Chest wall-  Abd- tender-no, distended-no, bowel sounds-present, HSM- no Br/ Gen/ Rectal- Not done, not indicated Extrem- cyanosis- none, clubbing, none, atrophy- none, strength- nl Neuro- grossly intact to observation

## 2011-06-28 NOTE — Patient Instructions (Signed)
Script so you have an Advair 250/50 to switch to as needed  Script sent refilling nebulizer med  Please call as needed  We are continuing Xolair.   Consider trying to rinse your nasal passages with saline- Neti pot or squeeze bottle, wen you feel sinus trouble building

## 2011-06-30 NOTE — Assessment & Plan Note (Signed)
Better control if she continues to Xolair. I don't have changes to offer now.

## 2011-06-30 NOTE — Assessment & Plan Note (Signed)
There is inspiratory noise transmitted to the trachea. She has no symptoms suggesting airway obstruction at that level or dysphagia. I don't think this finding is new. It raises the possibility again some of her obstructive airways disease is upper airway.

## 2011-07-12 ENCOUNTER — Ambulatory Visit (INDEPENDENT_AMBULATORY_CARE_PROVIDER_SITE_OTHER): Payer: Medicare Other

## 2011-07-12 DIAGNOSIS — J45909 Unspecified asthma, uncomplicated: Secondary | ICD-10-CM

## 2011-07-15 DIAGNOSIS — J45909 Unspecified asthma, uncomplicated: Secondary | ICD-10-CM

## 2011-07-15 MED ORDER — OMALIZUMAB 150 MG ~~LOC~~ SOLR
150.0000 mg | Freq: Once | SUBCUTANEOUS | Status: AC
  Administered 2011-07-15: 150 mg via SUBCUTANEOUS

## 2011-08-15 ENCOUNTER — Ambulatory Visit (INDEPENDENT_AMBULATORY_CARE_PROVIDER_SITE_OTHER): Payer: Medicare Other

## 2011-08-15 DIAGNOSIS — J45909 Unspecified asthma, uncomplicated: Secondary | ICD-10-CM

## 2011-08-18 DIAGNOSIS — J45909 Unspecified asthma, uncomplicated: Secondary | ICD-10-CM

## 2011-08-18 MED ORDER — OMALIZUMAB 150 MG ~~LOC~~ SOLR
150.0000 mg | Freq: Once | SUBCUTANEOUS | Status: AC
  Administered 2011-08-18: 150 mg via SUBCUTANEOUS

## 2011-08-19 ENCOUNTER — Encounter: Payer: Self-pay | Admitting: Internal Medicine

## 2011-08-24 NOTE — Progress Notes (Signed)
error 

## 2011-09-12 ENCOUNTER — Telehealth: Payer: Self-pay | Admitting: Internal Medicine

## 2011-09-12 MED ORDER — EPINEPHRINE 0.15 MG/0.3ML IJ DEVI: 0.1500 mg | INTRAMUSCULAR | Status: DC | PRN

## 2011-09-12 MED ORDER — FLUTICASONE-SALMETEROL 100-50 MCG/DOSE IN AEPB: 1.0000 | INHALATION_SPRAY | Freq: Two times a day (BID) | RESPIRATORY_TRACT | Status: DC

## 2011-09-12 MED ORDER — FLUTICASONE PROPIONATE 50 MCG/ACT NA SUSP: 2.0000 | Freq: Every day | NASAL | Status: DC

## 2011-09-12 MED ORDER — LORATADINE 10 MG PO TABS: 10.0000 mg | ORAL_TABLET | Freq: Every day | ORAL | Status: DC

## 2011-09-12 NOTE — Telephone Encounter (Signed)
Pt is aware that Rx's printed and Epipen Rx sent to [facility] as requested. The  Sample and printed rx's are in Allergy for pt to pick up as requested.

## 2011-09-13 ENCOUNTER — Ambulatory Visit (INDEPENDENT_AMBULATORY_CARE_PROVIDER_SITE_OTHER): Payer: Medicare Other

## 2011-09-13 DIAGNOSIS — J45909 Unspecified asthma, uncomplicated: Secondary | ICD-10-CM

## 2011-09-14 DIAGNOSIS — J45909 Unspecified asthma, uncomplicated: Secondary | ICD-10-CM

## 2011-09-14 MED ORDER — OMALIZUMAB 150 MG ~~LOC~~ SOLR
150.0000 mg | Freq: Once | SUBCUTANEOUS | Status: AC
  Administered 2011-09-14: 150 mg via SUBCUTANEOUS

## 2011-10-12 ENCOUNTER — Ambulatory Visit (INDEPENDENT_AMBULATORY_CARE_PROVIDER_SITE_OTHER): Payer: Medicare Other

## 2011-10-12 DIAGNOSIS — J45909 Unspecified asthma, uncomplicated: Secondary | ICD-10-CM

## 2011-10-12 MED ORDER — OMALIZUMAB 150 MG ~~LOC~~ SOLR
150.0000 mg | Freq: Once | SUBCUTANEOUS | Status: AC
  Administered 2011-10-12: 150 mg via SUBCUTANEOUS

## 2011-11-08 ENCOUNTER — Ambulatory Visit: Payer: Medicare Other

## 2011-11-10 ENCOUNTER — Ambulatory Visit (INDEPENDENT_AMBULATORY_CARE_PROVIDER_SITE_OTHER): Payer: TRICARE For Life (TFL)

## 2011-11-10 DIAGNOSIS — J45909 Unspecified asthma, uncomplicated: Secondary | ICD-10-CM

## 2011-11-10 MED ORDER — OMALIZUMAB 150 MG ~~LOC~~ SOLR
150.0000 mg | Freq: Once | SUBCUTANEOUS | Status: AC
  Administered 2011-11-10: 150 mg via SUBCUTANEOUS

## 2011-11-29 ENCOUNTER — Telehealth: Payer: Self-pay | Admitting: Internal Medicine

## 2011-11-29 NOTE — Telephone Encounter (Signed)
Pt is aware that we mailed the [employer] paperwork back within 2 days of getting them; pt will call and speak with a supervisor at [employer] regarding this matter. Pt understands if we can be of any help with this process please let me know.

## 2011-12-08 ENCOUNTER — Ambulatory Visit (INDEPENDENT_AMBULATORY_CARE_PROVIDER_SITE_OTHER): Payer: Medicare Other

## 2011-12-08 DIAGNOSIS — J45909 Unspecified asthma, uncomplicated: Secondary | ICD-10-CM

## 2011-12-09 DIAGNOSIS — J45909 Unspecified asthma, uncomplicated: Secondary | ICD-10-CM

## 2011-12-09 MED ORDER — OMALIZUMAB 150 MG ~~LOC~~ SOLR
150.0000 mg | Freq: Once | SUBCUTANEOUS | Status: AC
  Administered 2011-12-09: 150 mg via SUBCUTANEOUS

## 2011-12-13 ENCOUNTER — Telehealth: Payer: Self-pay | Admitting: Internal Medicine

## 2011-12-13 NOTE — Telephone Encounter (Signed)
Left message for patient to call me back-I have faxed the papers to [facility] [employer] and they should have them now! I had to fax a copy of the original we were given. Otherwise papers were mailed to [employer].

## 2011-12-14 NOTE — Telephone Encounter (Signed)
I spoke with pt and is aware the papers were faxed back over to [facility] [employer]. She voiced her understanding and needed nothing further

## 2011-12-27 ENCOUNTER — Ambulatory Visit (INDEPENDENT_AMBULATORY_CARE_PROVIDER_SITE_OTHER): Payer: Medicare Other | Admitting: Internal Medicine

## 2011-12-27 ENCOUNTER — Encounter: Payer: Self-pay | Admitting: Internal Medicine

## 2011-12-27 VITALS — BP 108/62 | HR 57 | Ht 61.5 in | Wt 167.2 lb

## 2011-12-27 DIAGNOSIS — J309 Allergic rhinitis, unspecified: Secondary | ICD-10-CM

## 2011-12-27 DIAGNOSIS — J449 Chronic obstructive pulmonary disease, unspecified: Secondary | ICD-10-CM

## 2011-12-27 NOTE — Progress Notes (Signed)
Subjective:    Patient ID: Corky Crafts, female    DOB: 1945/10/20, 66 y.o.   MRN: [id number]  HPI 66 yoF never smoker followed for hx of chronic obstructive asthma which has often been steroid dependent, complicated by allergic rhinitis, suspected vocal cord dysfunction syndrome. Last here- October 20/2011 for preop assessment. She ended up deferring shoulder surgeries because of Orthopedist's cautions due to her asthma. She got a second opinion at [facility] and has improved with Physical Therapy, so surgery is not planned now.  She started herself on antibiotic and prednisone in February for sinusitis and f/u w/ Dr Benedetto Goad- resolved. March 17 had difficulty getting deep breath. Went to [place] ER- had CXR,  Leg Dopplers, Stress test. Finally dx'd "pleurisy". A neb helped there, suggesting some mild asthma exacerbation w/o overt bronchospasm. Uses rescue inhaler 1-3x/ week. She used it today walking from parking lot.   Continues Xolair injections, credited for significant improvement. Diagnosed with meningioma.  06/28/11- 66 yoF never smoker followed for hx of chronic obstructive asthma which has often been steroid dependent, complicated by allergic rhinitis, suspected vocal cord dysfunction syndrome.  She continues Xolair injections and her Advair 100. At times she thinks she needs stronger Advair and we discussed this. She has definitely felt more stable in the last few years on Xolair. No prednisone since early summer when Dr.Shevlin gave it for allergic conjunctivitis. She did did have an episode of sinusitis early in the fall treated at an urgent care.  12/27/11-  66 yoF never smoker followed for hx of chronic obstructive asthma which has often been steroid dependent, complicated by allergic rhinitis, suspected vocal cord dysfunction syndrome.  Denies any wheezing,SOB, cough, or congestion at this time; still on Xolair and doing well. Had some increased age and cough during pollen season. Continues  Xolair therapy and Claritin with Flonase are sufficient. She stayed with Advair 100 and rarely needs her rescue inhaler. Occasional mild wheeze which she feels in her throat. PFT 01/28/2010 was reviewed with her-mild obstructive airways disease with response to bronchodilator. FEV1/FVC 0.72.  ROS-see HPI Constitutional:   No-   weight loss, night sweats, fevers, chills, fatigue, lassitude. HEENT:   No-   difficulty swallowing, tooth/dental problems, sore throat,  + L frontal facial pain- neurologist calls it "atypical facial pain"      No-  sneezing, itching, ear ache, nasal congestion, post nasal drip,  CV:  No-   chest pain, orthopnea, PND, swelling in lower extremities, anasarca, dizziness, palpitations Resp: No-   shortness of breath with exertion or at rest.              No-   productive cough,  No non-productive cough,  No- coughing up of blood.              No-   change in color of mucus.  No- wheezing.   Skin: No-   rash or lesions. GI:  No-   heartburn, indigestion, abdominal pain, nausea, vomiting, diarrhea,                 change in bowel habits, loss of appetite GU: pain. MS:  No-   joint pain or swelling.   Neuro-     nothing unusualk pain. Psych:  No- change in mood or affect. No depression or anxiety.  No memory loss.      Objective:   Physical Exam General- Alert, Oriented, Affect-appropriate, Distress- none acute; overweight Skin- rash-none, lesions- none, excoriation- none Lymphadenopathy- none  Head- atraumatic            Eyes- Gross vision intact, PERRLA, conjunctivae clear secretions. Chronic periorbital edema.            Ears- Hearing, canals-normal            Nose- Clear, no-Septal dev, mucus, polyps, erosion, perforation             Throat- Mallampati II , mucosa clear , drainage- none, tonsils- atrophic Neck- flexible , trachea midline, no stridor , thyroid nl, carotid no bruit Chest - symmetrical excursion , unlabored           Heart/CV- RRR , no murmur , no  gallop  , no rub, nl s1 s2                           - JVD- none , edema- none, stasis changes- none, varices- none           Lung- upper airway stridorous wheeze, unlabored. Chest is clear., cough- none , dullness-none, rub- none           Chest wall-  Abd-  Br/ Gen/ Rectal- Not done, not indicated Extrem- cyanosis- none, clubbing, none, atrophy- none, strength- nl Neuro- grossly intact to observation

## 2011-12-27 NOTE — Patient Instructions (Addendum)
We agreed to continue Advair 100 and Xolair  Please call as needed

## 2012-01-01 NOTE — Assessment & Plan Note (Signed)
Xolair has continued to be effective in conjunction with Advair 100. No changes required.

## 2012-01-01 NOTE — Assessment & Plan Note (Signed)
Adequate control as stated with symptoms increase during spring pollen season.

## 2012-01-05 ENCOUNTER — Ambulatory Visit (INDEPENDENT_AMBULATORY_CARE_PROVIDER_SITE_OTHER): Payer: Medicare Other

## 2012-01-05 DIAGNOSIS — J45909 Unspecified asthma, uncomplicated: Secondary | ICD-10-CM

## 2012-01-06 MED ORDER — OMALIZUMAB 150 MG ~~LOC~~ SOLR
150.0000 mg | Freq: Once | SUBCUTANEOUS | Status: AC
  Administered 2012-01-06: 150 mg via SUBCUTANEOUS

## 2012-02-02 ENCOUNTER — Ambulatory Visit (INDEPENDENT_AMBULATORY_CARE_PROVIDER_SITE_OTHER): Payer: Medicare Other

## 2012-02-02 DIAGNOSIS — J45909 Unspecified asthma, uncomplicated: Secondary | ICD-10-CM

## 2012-02-02 MED ORDER — OMALIZUMAB 150 MG ~~LOC~~ SOLR
150.0000 mg | Freq: Once | SUBCUTANEOUS | Status: AC
  Administered 2012-02-02: 150 mg via SUBCUTANEOUS

## 2012-03-01 ENCOUNTER — Ambulatory Visit (INDEPENDENT_AMBULATORY_CARE_PROVIDER_SITE_OTHER): Payer: Medicare Other

## 2012-03-01 DIAGNOSIS — J45909 Unspecified asthma, uncomplicated: Secondary | ICD-10-CM

## 2012-03-01 MED ORDER — OMALIZUMAB 150 MG ~~LOC~~ SOLR
150.0000 mg | Freq: Once | SUBCUTANEOUS | Status: AC
  Administered 2012-03-01: 150 mg via SUBCUTANEOUS

## 2012-03-29 ENCOUNTER — Ambulatory Visit (INDEPENDENT_AMBULATORY_CARE_PROVIDER_SITE_OTHER): Payer: Medicare Other

## 2012-03-29 DIAGNOSIS — J45909 Unspecified asthma, uncomplicated: Secondary | ICD-10-CM

## 2012-03-29 MED ORDER — OMALIZUMAB 150 MG ~~LOC~~ SOLR
150.0000 mg | Freq: Once | SUBCUTANEOUS | Status: AC
  Administered 2012-03-29: 150 mg via SUBCUTANEOUS

## 2012-04-26 ENCOUNTER — Ambulatory Visit (INDEPENDENT_AMBULATORY_CARE_PROVIDER_SITE_OTHER): Payer: Medicare Other

## 2012-04-26 DIAGNOSIS — J45909 Unspecified asthma, uncomplicated: Secondary | ICD-10-CM

## 2012-04-27 MED ORDER — OMALIZUMAB 150 MG ~~LOC~~ SOLR
150.0000 mg | Freq: Once | SUBCUTANEOUS | Status: AC
  Administered 2012-04-27: 150 mg via SUBCUTANEOUS

## 2012-05-24 ENCOUNTER — Ambulatory Visit (INDEPENDENT_AMBULATORY_CARE_PROVIDER_SITE_OTHER): Payer: Medicare Other

## 2012-05-24 DIAGNOSIS — J45909 Unspecified asthma, uncomplicated: Secondary | ICD-10-CM

## 2012-05-25 MED ORDER — OMALIZUMAB 150 MG ~~LOC~~ SOLR
150.0000 mg | Freq: Once | SUBCUTANEOUS | Status: AC
  Administered 2012-05-25: 150 mg via SUBCUTANEOUS

## 2012-06-21 ENCOUNTER — Ambulatory Visit (INDEPENDENT_AMBULATORY_CARE_PROVIDER_SITE_OTHER): Payer: Medicare Other

## 2012-06-21 ENCOUNTER — Ambulatory Visit: Payer: Medicare Other

## 2012-06-21 DIAGNOSIS — J45909 Unspecified asthma, uncomplicated: Secondary | ICD-10-CM

## 2012-06-22 DIAGNOSIS — J45909 Unspecified asthma, uncomplicated: Secondary | ICD-10-CM

## 2012-06-22 MED ORDER — OMALIZUMAB 150 MG ~~LOC~~ SOLR
150.0000 mg | Freq: Once | SUBCUTANEOUS | Status: AC
  Administered 2012-06-22: 150 mg via SUBCUTANEOUS

## 2012-07-03 ENCOUNTER — Encounter: Payer: Self-pay | Admitting: Internal Medicine

## 2012-07-03 ENCOUNTER — Telehealth: Payer: Self-pay | Admitting: Internal Medicine

## 2012-07-03 ENCOUNTER — Ambulatory Visit (INDEPENDENT_AMBULATORY_CARE_PROVIDER_SITE_OTHER): Payer: Medicare Other | Admitting: Internal Medicine

## 2012-07-03 VITALS — BP 118/74 | HR 63 | Ht 61.5 in | Wt 167.6 lb

## 2012-07-03 DIAGNOSIS — J449 Chronic obstructive pulmonary disease, unspecified: Secondary | ICD-10-CM

## 2012-07-03 DIAGNOSIS — J309 Allergic rhinitis, unspecified: Secondary | ICD-10-CM

## 2012-07-03 DIAGNOSIS — G4733 Obstructive sleep apnea (adult) (pediatric): Secondary | ICD-10-CM

## 2012-07-03 MED ORDER — CLARITHROMYCIN 500 MG PO TABS: 500.0000 mg | ORAL_TABLET | Freq: Two times a day (BID) | ORAL | Status: AC

## 2012-07-03 MED ORDER — PREDNISONE 10 MG PO TABS: ORAL_TABLET | ORAL | Status: DC

## 2012-07-03 MED ORDER — ALBUTEROL SULFATE (2.5 MG/3ML) 0.083% IN NEBU: 2.5000 mg | INHALATION_SOLUTION | Freq: Four times a day (QID) | RESPIRATORY_TRACT | Status: DC | PRN

## 2012-07-03 NOTE — Telephone Encounter (Signed)
ATC patient, no answer LMOMTCB 

## 2012-07-03 NOTE — Progress Notes (Signed)
Subjective:    Patient ID: Debra Byrd, female    DOB: July 28, 1946, 66 y.o.   MRN: [id number]  HPI 39 yoF never smoker followed for hx of chronic obstructive asthma which has often been steroid dependent, complicated by allergic rhinitis, suspected vocal cord dysfunction syndrome. Last here- October 20/2011 for preop assessment. She ended up deferring shoulder surgeries because of Orthopedist's cautions due to her asthma. She got a second opinion at [facility] and has improved with Physical Therapy, so surgery is not planned now.  She started herself on antibiotic and prednisone in February for sinusitis and f/u w/ Dr Benedetto Goad- resolved. March 17 had difficulty getting deep breath. Went to [facility]- had CXR,  Leg Dopplers, Stress test. Finally dx'd "pleurisy". A neb helped there, suggesting some mild asthma exacerbation w/o overt bronchospasm. Uses rescue inhaler 1-3x/ week. She used it today walking from parking lot.   Continues Xolair injections, credited for significant improvement. Diagnosed with meningioma.  06/28/11- 66 yoF never smoker followed for hx of chronic obstructive asthma which has often been steroid dependent, complicated by allergic rhinitis, suspected vocal cord dysfunction syndrome.  She continues Xolair injections and her Advair 100. At times she thinks she needs stronger Advair and we discussed this. She has definitely felt more stable in the last few years on Xolair. No prednisone since early summer when Dr.Shevlin gave it for allergic conjunctivitis. She did did have an episode of sinusitis early in the fall treated at an urgent care.  12/27/11-  66 yoF never smoker followed for hx of chronic obstructive asthma which has often been steroid dependent, complicated by allergic rhinitis, suspected vocal cord dysfunction syndrome.  Denies any wheezing,SOB, cough, or congestion at this time; still on Xolair and doing well. Had some increased age and cough during pollen season. Continues  Xolair therapy and Claritin with Flonase are sufficient. She stayed with Advair 100 and rarely needs her rescue inhaler. Occasional mild wheeze which she feels in her throat. PFT 01/28/2010 was reviewed with her-mild obstructive airways disease with response to bronchodilator. FEV1/FVC 0.72.  07/03/12- 66 yoF never smoker followed for hx of chronic obstructive asthma which has often been steroid dependent, complicated by allergic rhinitis, peripheral neuropathy, glaucoma, OSA  Continues Xolair once per month. Continues Advair 100. Had sinusitis in October treated with doxycycline. Feels breathing is well controlled with only occasional wheeze. Minor scratchy throat today. She wants Biaxin and prednisone to hold, emphasizing that she tolerates Biaxin well. Has noticed soreness upper anterior ribs with cough or deep breath, nonfocal.  ROS-see HPI Constitutional:   No-   weight loss, night sweats, fevers, chills, fatigue, lassitude. HEENT:   No-   difficulty swallowing, tooth/dental problems, sore throat,  + L frontal facial pain- neurologist calls it "atypical facial pain"      No-  sneezing, itching, ear ache, nasal congestion, post nasal drip,  CV:  + Noncardiac chest pain, no- orthopnea, PND, swelling in lower extremities, anasarca, dizziness, palpitations Resp: No-   shortness of breath with exertion or at rest.              No-   productive cough,  No non-productive cough,  No- coughing up of blood.              No-   change in color of mucus.  Little wheezing.   Skin: No-   rash or lesions. GI:  No-   heartburn, indigestion, abdominal pain, nausea, vomiting,  GU: pain. MS:  No-  joint pain or swelling.   Neuro-     nothing unusualk pain. Psych:  No- change in mood or affect. No depression or anxiety.  No memory loss.   Objective:   Physical Exam General- Alert, Oriented, Affect-appropriate, Distress- none acute; overweight Skin- rash-none, lesions- none, excoriation-  none Lymphadenopathy- none Head- atraumatic            Eyes- Gross vision intact, PERRLA, conjunctivae clear secretions. Chronic periorbital edema.            Ears- Hearing, canals-normal            Nose- Clear, no-Septal dev, mucus, polyps, erosion, perforation             Throat- Mallampati II , mucosa clear , drainage- none, tonsils- atrophic Neck- flexible , trachea midline, no stridor , thyroid nl, carotid no bruit Chest - symmetrical excursion , unlabored           Heart/CV- RRR , no murmur , no gallop  , no rub, nl s1 s2                           - JVD- none , edema- none, stasis changes- none, varices- none           Lung- clear, no- wheeze, unlabored. Chest is clear., cough- none , dullness-none, rub- none           Chest wall-  Abd-  Br/ Gen/ Rectal- Not done, not indicated Extrem- cyanosis- none, clubbing, none, atrophy- none, strength- nl Neuro- grossly intact to observation

## 2012-07-03 NOTE — Telephone Encounter (Signed)
Pt states CDY was going to change her from Duoneb to Albuterol nebs. I see where an order was given for nebulizer supplies but no rx. Pls advise with directions for use. Allergies  Allergen Reactions  . Azithromycin   . Cefaclor   . Iodine   . Levofloxacin   . Montelukast Sodium   . Penicillins   . Sulfonamide Derivatives

## 2012-07-03 NOTE — Telephone Encounter (Signed)
Per CY-give Albuterol 0.083% 1 vial QID prn #120 vial with prn refills.

## 2012-07-03 NOTE — Telephone Encounter (Signed)
Pt is aware that Rx was sent to pharmacy.

## 2012-07-03 NOTE — Patient Instructions (Addendum)
Scripts for biaxin and prednisone to hold  Order- DME/ [facility]   Script for replacement nebulizer tubing and supplies   Dx asthma  May call back for [place] to give you a pneumonia vaccine shot

## 2012-07-10 NOTE — Assessment & Plan Note (Signed)
Minor wheeze occasionally but she is satisfied.

## 2012-07-10 NOTE — Assessment & Plan Note (Signed)
Good control. Incidental sinus infection in October responded to doxycycline.

## 2012-07-10 NOTE — Assessment & Plan Note (Signed)
Plan-prescription for replacement tubing and supplies. Good compliance and control.

## 2012-07-24 ENCOUNTER — Ambulatory Visit (INDEPENDENT_AMBULATORY_CARE_PROVIDER_SITE_OTHER): Payer: Medicare Other

## 2012-07-24 DIAGNOSIS — J45909 Unspecified asthma, uncomplicated: Secondary | ICD-10-CM

## 2012-07-24 MED ORDER — OMALIZUMAB 150 MG ~~LOC~~ SOLR
150.0000 mg | Freq: Once | SUBCUTANEOUS | Status: AC
  Administered 2012-07-24: 150 mg via SUBCUTANEOUS

## 2012-08-21 ENCOUNTER — Ambulatory Visit (INDEPENDENT_AMBULATORY_CARE_PROVIDER_SITE_OTHER): Payer: Medicare Other

## 2012-08-21 DIAGNOSIS — J45909 Unspecified asthma, uncomplicated: Secondary | ICD-10-CM

## 2012-08-21 MED ORDER — OMALIZUMAB 150 MG ~~LOC~~ SOLR
150.0000 mg | Freq: Once | SUBCUTANEOUS | Status: AC
  Administered 2012-08-21: 150 mg via SUBCUTANEOUS

## 2012-09-14 ENCOUNTER — Telehealth: Payer: Self-pay | Admitting: Internal Medicine

## 2012-09-14 MED ORDER — FLUTICASONE PROPIONATE 50 MCG/ACT NA SUSP: 2.0000 | Freq: Every day | NASAL | Status: DC

## 2012-09-14 MED ORDER — FLUTICASONE-SALMETEROL 100-50 MCG/DOSE IN AEPB: 1.0000 | INHALATION_SPRAY | Freq: Two times a day (BID) | RESPIRATORY_TRACT | Status: DC

## 2012-09-14 MED ORDER — ALBUTEROL SULFATE HFA 108 (90 BASE) MCG/ACT IN AERS: 2.0000 | INHALATION_SPRAY | Freq: Four times a day (QID) | RESPIRATORY_TRACT | Status: DC | PRN

## 2012-09-14 MED ORDER — LORATADINE 10 MG PO TABS: 10.0000 mg | ORAL_TABLET | Freq: Every day | ORAL | Status: DC

## 2012-09-14 NOTE — Telephone Encounter (Signed)
Per CY - this is fine.  Prescriptions have been printed and she is aware that they will be ready for pick up.

## 2012-09-14 NOTE — Telephone Encounter (Signed)
Pt will pick up these rx on Tuesday when she comes in for her xolair injection.    Last ov--06/2012  Allergies  Allergen Reactions  . Azithromycin   . Cefaclor   . Iodine   . Levofloxacin   . Montelukast Sodium   . Penicillins   . Sulfonamide Derivatives

## 2012-09-18 ENCOUNTER — Ambulatory Visit (INDEPENDENT_AMBULATORY_CARE_PROVIDER_SITE_OTHER): Payer: Medicare Other

## 2012-09-18 ENCOUNTER — Ambulatory Visit: Payer: Medicare Other

## 2012-09-18 DIAGNOSIS — J45909 Unspecified asthma, uncomplicated: Secondary | ICD-10-CM

## 2012-09-24 MED ORDER — OMALIZUMAB 150 MG ~~LOC~~ SOLR
150.0000 mg | Freq: Once | SUBCUTANEOUS | Status: AC
  Administered 2012-09-24: 150 mg via SUBCUTANEOUS

## 2012-10-16 ENCOUNTER — Ambulatory Visit: Payer: Medicare Other

## 2012-10-18 ENCOUNTER — Ambulatory Visit: Payer: Medicare Other

## 2012-10-22 ENCOUNTER — Ambulatory Visit: Payer: Medicare Other

## 2012-10-23 ENCOUNTER — Telehealth: Payer: Self-pay | Admitting: Internal Medicine

## 2012-10-23 ENCOUNTER — Ambulatory Visit (INDEPENDENT_AMBULATORY_CARE_PROVIDER_SITE_OTHER): Payer: Medicare Other

## 2012-10-23 DIAGNOSIS — J45909 Unspecified asthma, uncomplicated: Secondary | ICD-10-CM

## 2012-10-23 MED ORDER — EPINEPHRINE 0.15 MG/0.3ML IJ DEVI: 0.1500 mg | INTRAMUSCULAR | Status: DC | PRN

## 2012-10-23 MED ORDER — OMALIZUMAB 150 MG ~~LOC~~ SOLR
150.0000 mg | Freq: Once | SUBCUTANEOUS | Status: AC
  Administered 2012-10-23: 150 mg via SUBCUTANEOUS

## 2012-10-23 NOTE — Telephone Encounter (Signed)
Rx for epipen was refilled Spoke with pt and notified that this was done She states nothing further needed

## 2012-11-12 ENCOUNTER — Other Ambulatory Visit: Payer: Self-pay | Admitting: Orthopaedic Surgery

## 2012-11-12 DIAGNOSIS — M25561 Pain in right knee: Secondary | ICD-10-CM

## 2012-11-17 ENCOUNTER — Ambulatory Visit
Admission: RE | Admit: 2012-11-17 | Discharge: 2012-11-17 | Disposition: A | Discharge status: Home / Self Care | Payer: Medicare Other | Source: Ambulatory Visit | Attending: Orthopaedic Surgery | Admitting: Orthopaedic Surgery

## 2012-11-17 DIAGNOSIS — M25561 Pain in right knee: Secondary | ICD-10-CM

## 2012-11-20 ENCOUNTER — Ambulatory Visit: Payer: Medicare Other

## 2012-11-21 ENCOUNTER — Ambulatory Visit (INDEPENDENT_AMBULATORY_CARE_PROVIDER_SITE_OTHER): Payer: Medicare Other

## 2012-11-21 DIAGNOSIS — J45909 Unspecified asthma, uncomplicated: Secondary | ICD-10-CM

## 2012-11-26 MED ORDER — OMALIZUMAB 150 MG ~~LOC~~ SOLR
150.0000 mg | Freq: Once | SUBCUTANEOUS | Status: AC
  Administered 2012-11-26: 150 mg via SUBCUTANEOUS

## 2012-12-19 ENCOUNTER — Ambulatory Visit (INDEPENDENT_AMBULATORY_CARE_PROVIDER_SITE_OTHER): Payer: Medicare Other

## 2012-12-19 DIAGNOSIS — J45909 Unspecified asthma, uncomplicated: Secondary | ICD-10-CM

## 2012-12-19 MED ORDER — OMALIZUMAB 150 MG ~~LOC~~ SOLR
150.0000 mg | Freq: Once | SUBCUTANEOUS | Status: AC
  Administered 2012-12-19: 150 mg via SUBCUTANEOUS

## 2012-12-31 ENCOUNTER — Telehealth: Payer: Self-pay | Admitting: Internal Medicine

## 2012-12-31 MED ORDER — CLARITHROMYCIN 500 MG PO TABS: 500.0000 mg | ORAL_TABLET | Freq: Two times a day (BID) | ORAL | Status: DC

## 2012-12-31 NOTE — Telephone Encounter (Signed)
I spoke with pt. She c/o cough w/ slight yellow mucus, drainage, chest tx, slight wheezing x last wed. No f/c/s/n/v. She started herself on pred 10 mg QD on Saturday. She is wanting to know if she can have biaxin called in and how long should she take pred 10 mg daily or if she should increase? Please advise Dr. Maple Hudson thanks Last OV 07/03/12 Pending 01/08/13  Allergies  Allergen Reactions  . Azithromycin   . Cefaclor   . Iodine   . Levofloxacin   . Montelukast Sodium   . Penicillins   . Sulfonamide Derivatives

## 2012-12-31 NOTE — Telephone Encounter (Signed)
Per CY-okay to give Biaxin 500 mg #14 take 1 po BID no refills; see what this does before increasing prednisone.

## 2012-12-31 NOTE — Telephone Encounter (Signed)
Called, spoke with pt.  Informed her of below per CDY.  She verbalized understanding and aware Biaxin rx sent to [facility]. Pt aware to call back if symptoms do not improve or worsen and to seek emergency care if needed. She verbalized understanding of instructions and voiced no further questions or concerns at this time.

## 2013-01-03 ENCOUNTER — Telehealth: Payer: Self-pay | Admitting: Internal Medicine

## 2013-01-03 NOTE — Telephone Encounter (Signed)
Spoke with pt and notified of recs per CDY She verbalized understanding and states nothing further needed 

## 2013-01-03 NOTE — Telephone Encounter (Signed)
Per CY-okay to try increasing Prednisone as recommended by PCP.

## 2013-01-03 NOTE — Telephone Encounter (Signed)
PT seen PCP this am because still c/o cough and wheezing.  On Biaxin, Guafenesin,Prednisone 10mg /day,  Duoneb prn, and cough syrup with codeine.  PCP recommended increasing Prednisone to 50 mg for 3 days then stop completely.  Pt would like Dr Roxy Cedar recommendations for Prednisone.

## 2013-01-07 ENCOUNTER — Ambulatory Visit (INDEPENDENT_AMBULATORY_CARE_PROVIDER_SITE_OTHER): Payer: Medicare Other | Admitting: Internal Medicine

## 2013-01-07 ENCOUNTER — Encounter: Payer: Self-pay | Admitting: Internal Medicine

## 2013-01-07 VITALS — BP 138/80 | HR 63 | Ht 61.0 in | Wt 172.6 lb

## 2013-01-07 DIAGNOSIS — J449 Chronic obstructive pulmonary disease, unspecified: Secondary | ICD-10-CM

## 2013-01-07 MED ORDER — CLARITHROMYCIN 500 MG PO TABS: 500.0000 mg | ORAL_TABLET | Freq: Two times a day (BID) | ORAL | Status: DC

## 2013-01-07 MED ORDER — PREDNISONE 10 MG PO TABS: ORAL_TABLET | ORAL | Status: DC

## 2013-01-07 MED ORDER — EPINEPHRINE 0.3 MG/0.3ML IJ SOAJ: 0.3000 mg | Freq: Once | INTRAMUSCULAR | Status: DC

## 2013-01-07 NOTE — Patient Instructions (Addendum)
Refill scripts for Epipen, prednisone and biaxin to hold as discussed  Please call as needed

## 2013-01-07 NOTE — Progress Notes (Signed)
Subjective:    Patient ID: Debra Byrd, female    DOB: 1945-09-01, 67 y.o.   MRN: [id number]  HPI 65 yoF never smoker followed for hx of chronic obstructive asthma which has often been steroid dependent, complicated by allergic rhinitis, suspected vocal cord dysfunction syndrome. Last here- October 20/2011 for preop assessment. She ended up deferring shoulder surgeries because of Orthopedist's cautions due to her asthma. She got a second opinion at [facility] and has improved with Physical Therapy, so surgery is not planned now.  She started herself on antibiotic and prednisone in February for sinusitis and f/u w/ Dr Benedetto Goad- resolved. March 17 had difficulty getting deep breath. Went to [place] [facility]- had CXR,  Leg Dopplers, Stress test. Finally dx'd "pleurisy". A neb helped there, suggesting some mild asthma exacerbation w/o overt bronchospasm. Uses rescue inhaler 1-3x/ week. She used it today walking from parking lot.   Continues Xolair injections, credited for significant improvement. Diagnosed with meningioma.  06/28/11- 64 yoF never smoker followed for hx of chronic obstructive asthma which has often been steroid dependent, complicated by allergic rhinitis, suspected vocal cord dysfunction syndrome.  She continues Xolair injections and her Advair 100. At times she thinks she needs stronger Advair and we discussed this. She has definitely felt more stable in the last few years on Xolair. No prednisone since early summer when Dr.Shevlin gave it for allergic conjunctivitis. She did did have an episode of sinusitis early in the fall treated at an urgent care.  12/27/11-  64 yoF never smoker followed for hx of chronic obstructive asthma which has often been steroid dependent, complicated by allergic rhinitis, suspected vocal cord dysfunction syndrome.  Denies any wheezing,SOB, cough, or congestion at this time; still on Xolair and doing well. Had some increased age and cough during pollen season. Continues  Xolair therapy and Claritin with Flonase are sufficient. She stayed with Advair 100 and rarely needs her rescue inhaler. Occasional mild wheeze which she feels in her throat. PFT 01/28/2010 was reviewed with her-mild obstructive airways disease with response to bronchodilator. FEV1/FVC 0.72.  07/03/12- 66 yoF never smoker followed for hx of chronic obstructive asthma which has often been steroid dependent, complicated by allergic rhinitis, peripheral neuropathy, glaucoma, OSA  Continues Xolair once per month. Continues Advair 100. Had sinusitis in October treated with doxycycline. Feels breathing is well controlled with only occasional wheeze. Minor scratchy throat today. She wants Biaxin and prednisone to hold, emphasizing that she tolerates Biaxin well. Has noticed soreness upper anterior ribs with cough or deep breath, nonfocal.  01/07/13- 66 yoF never smoker followed for hx of chronic obstructive asthma which has often been steroid dependent, complicated by allergic rhinitis, peripheral neuropathy, glaucoma, OSA FOLLOWS FOR: has 1 day left of abx for asthma flare up; continues Xolair injections and doing well with them. Resolving acute bronchitis, now on prednisone and Biaxin. She wants prescriptions to hold for these which we discussed.  ROS-see HPI Constitutional:   No-   weight loss, night sweats, fevers, chills, fatigue, lassitude. HEENT:   No-   difficulty swallowing, tooth/dental problems, sore throat,  + L frontal facial pain- neurologist calls it "atypical facial pain"      No-  sneezing, itching, ear ache, nasal congestion, post nasal drip,  CV:  + Noncardiac chest pain, no- orthopnea, PND, swelling in lower extremities, anasarca, dizziness, palpitations Resp: No-   shortness of breath with exertion or at rest.  No-   productive cough,  + non-productive cough,  No- coughing up of blood.              No-   change in color of mucus.  +Little wheezing.   Skin: No-   rash or  lesions. GI:  No-   heartburn, indigestion, abdominal pain, nausea, vomiting,  GU: pain. MS:  No-   joint pain or swelling.   Neuro-     nothing unusualk pain. Psych:  No- change in mood or affect. No depression or anxiety.  No memory loss.   Objective:   Physical Exam General- Alert, Oriented, Affect-appropriate, Distress- none acute; overweight, talkative/NAD Skin- rash-none, lesions- none, excoriation- none Lymphadenopathy- none Head- atraumatic            Eyes- Gross vision intact, PERRLA, conjunctivae clear secretions. Chronic periorbital edema.            Ears- Hearing, canals-normal            Nose- Clear, no-Septal dev, mucus, polyps, erosion, perforation             Throat- Mallampati II , mucosa clear , drainage- none, tonsils- atrophic Neck- flexible , trachea midline, no stridor , thyroid nl, carotid no bruit Chest - symmetrical excursion , unlabored           Heart/CV- RRR , no murmur , no gallop  , no rub, nl s1 s2                           - JVD- none , edema- none, stasis changes- none, varices- none           Lung- +raspy wheezy cough, unlabored, dullness-none, rub- none           Chest wall-  Abd-  Br/ Gen/ Rectal- Not done, not indicated Extrem- cyanosis- none, clubbing, none, atrophy- none, strength- nl Neuro- grossly intact to observation

## 2013-01-08 ENCOUNTER — Ambulatory Visit: Payer: Medicare Other | Admitting: Internal Medicine

## 2013-01-17 ENCOUNTER — Ambulatory Visit (INDEPENDENT_AMBULATORY_CARE_PROVIDER_SITE_OTHER): Payer: Medicare Other

## 2013-01-17 DIAGNOSIS — J45909 Unspecified asthma, uncomplicated: Secondary | ICD-10-CM

## 2013-01-19 NOTE — Assessment & Plan Note (Signed)
Xolair continues to provide substantial sleep stabilization. Okay to provide standby prescription for Biaxin and prednisone discussion. Particularly issues of cumulative steroid use which we have emphasized.

## 2013-01-21 MED ORDER — OMALIZUMAB 150 MG ~~LOC~~ SOLR
150.0000 mg | Freq: Once | SUBCUTANEOUS | Status: AC
  Administered 2013-01-21: 150 mg via SUBCUTANEOUS

## 2013-02-14 ENCOUNTER — Ambulatory Visit (INDEPENDENT_AMBULATORY_CARE_PROVIDER_SITE_OTHER): Payer: Medicare Other

## 2013-02-14 DIAGNOSIS — J45909 Unspecified asthma, uncomplicated: Secondary | ICD-10-CM

## 2013-02-14 DIAGNOSIS — J309 Allergic rhinitis, unspecified: Secondary | ICD-10-CM

## 2013-02-20 MED ORDER — OMALIZUMAB 150 MG ~~LOC~~ SOLR
150.0000 mg | Freq: Once | SUBCUTANEOUS | Status: AC
  Administered 2013-02-20: 150 mg via SUBCUTANEOUS

## 2013-03-12 ENCOUNTER — Telehealth: Payer: Self-pay | Admitting: Internal Medicine

## 2013-03-12 MED ORDER — ALBUTEROL SULFATE HFA 108 (90 BASE) MCG/ACT IN AERS: 2.0000 | INHALATION_SPRAY | Freq: Four times a day (QID) | RESPIRATORY_TRACT | Status: DC | PRN

## 2013-03-12 NOTE — Telephone Encounter (Signed)
I have changed the HFA inhaler on patients medication list and printed new Rx for her to pick up Thursday when she gets her Xolair injection. Nothing more needed.

## 2013-03-14 ENCOUNTER — Ambulatory Visit (INDEPENDENT_AMBULATORY_CARE_PROVIDER_SITE_OTHER): Payer: Medicare Other

## 2013-03-14 DIAGNOSIS — J45909 Unspecified asthma, uncomplicated: Secondary | ICD-10-CM

## 2013-03-18 MED ORDER — OMALIZUMAB 150 MG ~~LOC~~ SOLR
150.0000 mg | Freq: Once | SUBCUTANEOUS | Status: AC
  Administered 2013-03-18: 150 mg via SUBCUTANEOUS

## 2013-04-15 ENCOUNTER — Ambulatory Visit (INDEPENDENT_AMBULATORY_CARE_PROVIDER_SITE_OTHER): Payer: Medicare Other

## 2013-04-15 DIAGNOSIS — J45909 Unspecified asthma, uncomplicated: Secondary | ICD-10-CM

## 2013-04-16 MED ORDER — OMALIZUMAB 150 MG ~~LOC~~ SOLR
150.0000 mg | Freq: Once | SUBCUTANEOUS | Status: AC
  Administered 2013-04-16: 150 mg via SUBCUTANEOUS

## 2013-05-14 ENCOUNTER — Ambulatory Visit (INDEPENDENT_AMBULATORY_CARE_PROVIDER_SITE_OTHER): Payer: Medicare Other

## 2013-05-14 DIAGNOSIS — J449 Chronic obstructive pulmonary disease, unspecified: Secondary | ICD-10-CM

## 2013-05-15 MED ORDER — OMALIZUMAB 150 MG ~~LOC~~ SOLR
150.0000 mg | Freq: Once | SUBCUTANEOUS | Status: AC
  Administered 2013-05-15: 150 mg via SUBCUTANEOUS

## 2013-06-11 ENCOUNTER — Ambulatory Visit (INDEPENDENT_AMBULATORY_CARE_PROVIDER_SITE_OTHER): Payer: Medicare Other

## 2013-06-11 DIAGNOSIS — J449 Chronic obstructive pulmonary disease, unspecified: Secondary | ICD-10-CM

## 2013-06-11 DIAGNOSIS — J4489 Other specified chronic obstructive pulmonary disease: Secondary | ICD-10-CM

## 2013-06-12 MED ORDER — OMALIZUMAB 150 MG ~~LOC~~ SOLR
150.0000 mg | Freq: Once | SUBCUTANEOUS | Status: AC
  Administered 2013-06-12: 150 mg via SUBCUTANEOUS

## 2013-06-17 ENCOUNTER — Ambulatory Visit (INDEPENDENT_AMBULATORY_CARE_PROVIDER_SITE_OTHER): Payer: Medicare Other

## 2013-06-17 DIAGNOSIS — Z23 Encounter for immunization: Secondary | ICD-10-CM

## 2013-07-09 ENCOUNTER — Ambulatory Visit: Payer: Medicare Other | Admitting: Critical Care Medicine

## 2013-07-09 ENCOUNTER — Ambulatory Visit (INDEPENDENT_AMBULATORY_CARE_PROVIDER_SITE_OTHER): Payer: Medicare Other

## 2013-07-09 DIAGNOSIS — J45909 Unspecified asthma, uncomplicated: Secondary | ICD-10-CM

## 2013-07-10 ENCOUNTER — Ambulatory Visit (INDEPENDENT_AMBULATORY_CARE_PROVIDER_SITE_OTHER): Payer: Medicare Other | Admitting: Internal Medicine

## 2013-07-10 ENCOUNTER — Encounter: Payer: Self-pay | Admitting: Internal Medicine

## 2013-07-10 VITALS — BP 134/84 | HR 69 | Ht 61.0 in | Wt 175.8 lb

## 2013-07-10 DIAGNOSIS — J449 Chronic obstructive pulmonary disease, unspecified: Secondary | ICD-10-CM

## 2013-07-10 DIAGNOSIS — Z23 Encounter for immunization: Secondary | ICD-10-CM

## 2013-07-10 DIAGNOSIS — J309 Allergic rhinitis, unspecified: Secondary | ICD-10-CM

## 2013-07-10 DIAGNOSIS — G4733 Obstructive sleep apnea (adult) (pediatric): Secondary | ICD-10-CM

## 2013-07-10 MED ORDER — OMALIZUMAB 150 MG ~~LOC~~ SOLR
150.0000 mg | Freq: Once | SUBCUTANEOUS | Status: AC
  Administered 2013-07-10: 150 mg via SUBCUTANEOUS

## 2013-07-10 NOTE — Progress Notes (Signed)
Subjective:    Patient ID: Debra Byrd, female    DOB: 04/20/1946, 67 y.o.   MRN: [id number]  HPI [age] yoF never smoker followed for hx of chronic obstructive asthma which has often been steroid dependent, complicated by allergic rhinitis, suspected vocal cord dysfunction syndrome. Last here- October 20/2011 for preop assessment. She ended up deferring shoulder surgeries because of Orthopedist's cautions due to her asthma. [facility] got a [facility] opinion [facility] and has improved with Physical Therapy, so surgery is not planned now.  She started herself on antibiotic and prednisone in February for sinusitis and f/u w/ Dr Benedetto GoadShevlin- resolved. March 17 had difficulty getting deep breath. Went to [facility]- had CXR,  Leg Dopplers, Stress test. Finally dx'd "pleurisy". A neb helped there, suggesting some mild asthma exacerbation w/o overt bronchospasm. Uses rescue inhaler 1-3x/ week. She used it today walking from parking lot.   Continues Xolair injections, credited for significant improvement. Diagnosed with meningioma.  06/28/11- 64 yoF never smoker followed for hx of chronic obstructive asthma which has often been steroid dependent, complicated by allergic rhinitis, suspected vocal cord dysfunction syndrome.  She continues Xolair injections and her Advair 100. At times she thinks she needs stronger Advair and we discussed this. She has definitely felt more stable in the last few years on Xolair. No prednisone since early summer when Dr.Shevlin gave it for allergic conjunctivitis. She did did have an episode of sinusitis early in the fall treated at an urgent care.  12/27/11-  64 yoF never smoker followed for hx of chronic obstructive asthma which has often been steroid dependent, complicated by allergic rhinitis, suspected vocal cord dysfunction syndrome.  Denies any wheezing,SOB, cough, or congestion at this time; still on Xolair and doing well. Had some increased age and cough during pollen season. Continues  Xolair therapy and Claritin with Flonase are sufficient. She stayed with Advair 100 and rarely needs her rescue inhaler. Occasional mild wheeze which she feels in her throat. PFT 01/28/2010 was reviewed with her-mild obstructive airways disease with response to bronchodilator. FEV1/FVC 0.72.  07/03/12- 66 yoF never smoker followed for hx of chronic obstructive asthma which has often been steroid dependent, complicated by allergic rhinitis, peripheral neuropathy, glaucoma, OSA  Continues Xolair once per month. Continues Advair 100. Had sinusitis in October treated with doxycycline. Feels breathing is well controlled with only occasional wheeze. Minor scratchy throat today. She wants Biaxin and prednisone to hold, emphasizing that she tolerates Biaxin well. Has noticed soreness upper anterior ribs with cough or deep breath, nonfocal.  01/07/13- 66 yoF never smoker followed for hx of chronic obstructive asthma which has often been steroid dependent, complicated by allergic rhinitis, peripheral neuropathy, glaucoma, OSA FOLLOWS FOR: has 1 day left of abx for asthma flare up; continues Xolair injections and doing well with them. Resolving acute bronchitis, now on prednisone and Biaxin. She wants prescriptions to hold for these which we discussed.-  07/10/13-67 yoF never smoker followed for hx of chronic obstructive asthma/ Xolair which has often been steroid dependent, complicated by allergic rhinitis, peripheral neuropathy, glaucoma, OSA FOLLOWS FOR:  Breathing is unchanged since last OV.  Doing well on Xolair Has been more sleepy. Her primary physician asked about an update of her sleep study. Bedtime around 10 PM with short sleep latency. Wakes around 2 AM for bathroom. Husband wears CPAP and has restless sleep bothers her. She wakes tired about 6 or 7 AM. No naps. Little caffeine. Some lightheaded/dizzy NPSG 11/01/04 AHI 6.4/ hr. She  has never used CPAP. Now on Biaxin for sinus infection. No wheezing.  Likes Xolair.  ROS-see HPI Constitutional:   No-   weight loss, night sweats, fevers, chills, +fatigue, lassitude. HEENT:   No-   difficulty swallowing, tooth/dental problems, sore throat,  + L frontal facial pain- neurologist calls it "atypical facial pain"      No-  sneezing, itching, ear ache, nasal congestion, post nasal drip,  CV:  + Noncardiac chest pain, no- orthopnea, PND, swelling in lower extremities, anasarca, dizziness, palpitations Resp: No-   shortness of breath with exertion or at rest.              No-   productive cough,  + non-productive cough,  No- coughing up of blood.              No-   change in color of mucus.  +Little wheezing.   Skin: No-   rash or lesions. GI:  No-   heartburn, indigestion, abdominal pain, nausea, vomiting,  GU: pain. MS:  No-   joint pain or swelling.   Neuro-     nothing unusualk pain. Psych:  No- change in mood or affect. No depression or anxiety.  No memory loss.   Objective:   Physical Exam General- Alert, Oriented, Affect-appropriate, Distress- none acute; overweight, talkative/NAD Skin- rash-none, lesions- none, excoriation- none Lymphadenopathy- none Head- atraumatic            Eyes- Gross vision intact, PERRLA, conjunctivae clear secretions. Chronic periorbital edema.            Ears- Hearing, canals-normal            Nose- Clear, no-Septal dev, mucus, polyps, erosion, perforation             Throat- Mallampati II-III , mucosa clear , drainage- none, tonsils- atrophic Neck- flexible , trachea midline, no stridor , thyroid nl, carotid no bruit Chest - symmetrical excursion , unlabored           Heart/CV- RRR , no murmur , no gallop  , no rub, nl s1 s2                           - JVD- none , edema- none, stasis changes- none, varices- none           Lung- clear, unlabored, dullness-none, rub- none           Chest wall-  Abd-  Br/ Gen/ Rectal- Not done, not indicated Extrem- cyanosis- none, clubbing, none, atrophy- none, strength-  nl Neuro- grossly intact to observation

## 2013-07-10 NOTE — Patient Instructions (Signed)
Pneumonia vaccine conjugate 13  Try meclizine otc for vertigo if needed  Order- schedule Unattended Home Sleep study for dx OSA

## 2013-07-15 ENCOUNTER — Telehealth: Payer: Self-pay | Admitting: Internal Medicine

## 2013-07-15 NOTE — Telephone Encounter (Signed)
I spoke and she spoke with Almyra Free and was scheduled to come in on 12-1 to get home sleep machine. Pt states she realized that she will be out of town that say and needs to r/s this. Please advise. Carron Curie, CMA

## 2013-07-15 NOTE — Telephone Encounter (Signed)
Spoke to pt she will pick up alice machine 07/25/13@11 :00am Tobe Sos

## 2013-07-26 DIAGNOSIS — G4733 Obstructive sleep apnea (adult) (pediatric): Secondary | ICD-10-CM

## 2013-07-27 NOTE — Assessment & Plan Note (Signed)
Currently controlled.

## 2013-07-27 NOTE — Assessment & Plan Note (Addendum)
Successful with Xolair. Significantly better control. Plan-pneumonia vaccine

## 2013-07-27 NOTE — Assessment & Plan Note (Signed)
Plan-discussed sleep hygiene. Scheduling unattended home sleep study

## 2013-08-06 ENCOUNTER — Ambulatory Visit (INDEPENDENT_AMBULATORY_CARE_PROVIDER_SITE_OTHER): Payer: Medicare Other

## 2013-08-06 DIAGNOSIS — J45909 Unspecified asthma, uncomplicated: Secondary | ICD-10-CM

## 2013-08-06 MED ORDER — OMALIZUMAB 150 MG ~~LOC~~ SOLR
150.0000 mg | Freq: Once | SUBCUTANEOUS | Status: AC
  Administered 2013-08-06: 150 mg via SUBCUTANEOUS

## 2013-08-07 ENCOUNTER — Encounter: Payer: Self-pay | Admitting: Internal Medicine

## 2013-08-07 DIAGNOSIS — G4733 Obstructive sleep apnea (adult) (pediatric): Secondary | ICD-10-CM

## 2013-08-23 ENCOUNTER — Encounter: Payer: Self-pay | Admitting: Internal Medicine

## 2013-09-03 ENCOUNTER — Ambulatory Visit: Payer: Medicare Other

## 2013-09-09 ENCOUNTER — Telehealth: Payer: Self-pay | Admitting: Internal Medicine

## 2013-09-09 ENCOUNTER — Ambulatory Visit (INDEPENDENT_AMBULATORY_CARE_PROVIDER_SITE_OTHER): Payer: Medicare Other | Admitting: Internal Medicine

## 2013-09-09 ENCOUNTER — Encounter: Payer: Self-pay | Admitting: Internal Medicine

## 2013-09-09 VITALS — BP 130/82 | HR 66 | Temp 98.0°F | Ht 61.0 in | Wt 180.0 lb

## 2013-09-09 DIAGNOSIS — J309 Allergic rhinitis, unspecified: Secondary | ICD-10-CM

## 2013-09-09 DIAGNOSIS — J449 Chronic obstructive pulmonary disease, unspecified: Secondary | ICD-10-CM

## 2013-09-09 MED ORDER — FLUTICASONE PROPIONATE 50 MCG/ACT NA SUSP: 2.0000 | Freq: Every day | NASAL | Status: DC

## 2013-09-09 MED ORDER — EPINEPHRINE 0.3 MG/0.3ML IJ SOAJ: INTRAMUSCULAR | Status: DC

## 2013-09-09 MED ORDER — LORATADINE 10 MG PO TABS: 10.0000 mg | ORAL_TABLET | Freq: Every day | ORAL | Status: DC

## 2013-09-09 MED ORDER — LEVALBUTEROL HCL 0.63 MG/3ML IN NEBU
0.6300 mg | INHALATION_SOLUTION | Freq: Once | RESPIRATORY_TRACT | Status: AC
  Administered 2013-09-09: 0.63 mg via RESPIRATORY_TRACT

## 2013-09-09 MED ORDER — FLUTICASONE-SALMETEROL 100-50 MCG/DOSE IN AEPB: INHALATION_SPRAY | RESPIRATORY_TRACT | Status: DC

## 2013-09-09 MED ORDER — PREDNISONE 10 MG PO TABS: ORAL_TABLET | ORAL | Status: DC

## 2013-09-09 NOTE — Progress Notes (Signed)
Subjective:    Patient ID: Debra Byrd, female    DOB: 04/20/1946, 68 y.o.   MRN: [id number]  HPI [age] yoF never smoker followed for hx of chronic obstructive asthma which has often been steroid dependent, complicated by allergic rhinitis, suspected vocal cord dysfunction syndrome. Last here- October 20/2011 for preop assessment. She ended up deferring shoulder surgeries because of Orthopedist's cautions due to her asthma. [facility] got [facility] and has improved with Physical Therapy, so surgery is not planned now.  She started herself on antibiotic and prednisone in February for sinusitis and f/u w/ Dr Benedetto GoadShevlin- resolved. March 17 had difficulty getting deep breath. [facility] to [facility]- had CXR,  Leg Dopplers, Stress test. Finally dx'd "pleurisy". A neb helped there, suggesting some mild asthma exacerbation w/o overt bronchospasm. Uses rescue inhaler 1-3x/ week. She used it today walking from parking lot.   Continues Xolair injections, credited for significant improvement. Diagnosed with meningioma.  06/28/11- 64 yoF never smoker followed for hx of chronic obstructive asthma which has often been steroid dependent, complicated by allergic rhinitis, suspected vocal cord dysfunction syndrome.  She continues Xolair injections and her Advair 100. At times she thinks she needs stronger Advair and we discussed this. She has definitely felt more stable in the last few years on Xolair. No prednisone since early summer when Dr.Shevlin gave it for allergic conjunctivitis. She did did have an episode of sinusitis early in the fall treated at an urgent care.  12/27/11-  64 yoF never smoker followed for hx of chronic obstructive asthma which has often been steroid dependent, complicated by allergic rhinitis, suspected vocal cord dysfunction syndrome.  Denies any wheezing,SOB, cough, or congestion at this time; still on Xolair and doing well. Had some increased age and cough during pollen season. Continues  Xolair therapy and Claritin with Flonase are sufficient. She stayed with Advair 100 and rarely needs her rescue inhaler. Occasional mild wheeze which she feels in her throat. PFT 01/28/2010 was reviewed with her-mild obstructive airways disease with response to bronchodilator. FEV1/FVC 0.72.  07/03/12- 66 yoF never smoker followed for hx of chronic obstructive asthma which has often been steroid dependent, complicated by allergic rhinitis, peripheral neuropathy, glaucoma, OSA  Continues Xolair once per month. Continues Advair 100. Had sinusitis in October treated with doxycycline. Feels breathing is well controlled with only occasional wheeze. Minor scratchy throat today. She wants Biaxin and prednisone to hold, emphasizing that she tolerates Biaxin well. Has noticed soreness upper anterior ribs with cough or deep breath, nonfocal.  01/07/13- 66 yoF never smoker followed for hx of chronic obstructive asthma which has often been steroid dependent, complicated by allergic rhinitis, peripheral neuropathy, glaucoma, OSA FOLLOWS FOR: has 1 day left of abx for asthma flare up; continues Xolair injections and doing well with them. Resolving acute bronchitis, now on prednisone and Biaxin. She wants prescriptions to hold for these which we discussed.-  07/10/13-67 yoF never smoker followed for hx of chronic obstructive asthma/ Xolair which has often been steroid dependent, complicated by allergic rhinitis, peripheral neuropathy, glaucoma, OSA FOLLOWS FOR:  Breathing is unchanged since last OV.  Doing well on Xolair Has been more sleepy. Her primary physician asked about an update of her sleep study. Bedtime around 10 PM with short sleep latency. Wakes around 2 AM for bathroom. Husband wears CPAP and has restless sleep bothers her. She wakes tired about 6 or 7 AM. No naps. Little caffeine. Some lightheaded/dizzy NPSG 11/01/04 AHI 6.4/ hr. She  has never used CPAP. Now on Biaxin for sinus infection. No wheezing.  Likes Xolair.  09/09/13- 67 yoF never smoker followed for hx of chronic obstructive asthma/ Xolair which has often been steroid dependent, complicated by allergic rhinitis, peripheral neuropathy, glaucoma, OSA FOLLOWS FOR:  c/o:  sinus infection that has turned in to bronchitis.  Still having cough and mucus--thick and white in color.  Finished round of antibiotcs, still taking pred 10mg  daily  ROS-see HPI Constitutional:   No-   weight loss, night sweats, fevers, chills, +fatigue, lassitude. HEENT:   No-   difficulty swallowing, tooth/dental problems, sore throat,  + L frontal facial pain- neurologist calls it "atypical facial pain"      No-  sneezing, itching, ear ache, nasal congestion, post nasal drip,  CV:  + Noncardiac chest pain, no- orthopnea, PND, swelling in lower extremities, anasarca, dizziness, palpitations Resp:+shortness of breath with exertion or at rest.              + productive cough,  + non-productive cough,  No- coughing up of blood.              No-   change in color of mucus.  +Little wheezing.   Skin: No-   rash or lesions. GI:  No-   heartburn, indigestion, abdominal pain, nausea, vomiting,  GU: pain. MS:  No-   joint pain or swelling.   Neuro-     nothing unusualk pain. Psych:  No- change in mood or affect. No depression or anxiety.  No memory loss.   Objective:   Physical Exam General- Alert, Oriented, Affect-appropriate, Distress- none acute; overweight, talkative/NAD Skin- rash-none, lesions- none, excoriation- none Lymphadenopathy- none Head- atraumatic            Eyes- Gross vision intact, PERRLA, conjunctivae clear secretions. Chronic periorbital edema.            Ears- Hearing, canals-normal            Nose- Clear, no-Septal dev, mucus, polyps, erosion, perforation             Throat- Mallampati II-III , mucosa clear , drainage- none, tonsils- atrophic Neck- flexible , trachea midline, no stridor , thyroid nl, carotid no bruit Chest - symmetrical  excursion , unlabored           Heart/CV- RRR , no murmur , no gallop  , no rub, nl s1 s2                           - JVD- none , edema- none, stasis changes- none, varices- none           Lung- +mild rattle, unlabored, dullness-none, rub- none           Chest wall-  Abd-  Br/ Gen/ Rectal- Not done, not indicated Extrem- cyanosis- none, clubbing, none, atrophy- none, strength- nl Neuro- grossly intact to observation

## 2013-09-09 NOTE — Telephone Encounter (Signed)
lmtcb x1 

## 2013-09-09 NOTE — Telephone Encounter (Signed)
Returning call.

## 2013-09-09 NOTE — Telephone Encounter (Signed)
The home sleep study was within normal limits. She had 4.4 apneas/ hour and the normal range is between 0-5/ hr. She did snore, but her oxygen saturation averaged in the normal range. So technically she does not have sleep apnea at this time.  She can reduce the prednisone dose since she doesn't tolerate it well.  Suggest she cut it in half- 20 mg x 2 days, 15 mg (1.5 tabs) daily x 2 days, 10 mg daily x 2 days, 5 mg (1/2 tab) daily x 2 days, then stop.

## 2013-09-09 NOTE — Telephone Encounter (Signed)
Spoke with the pt  She states forgot to talk with CDY today about her sleep study  She wanted to let him know that she did not sleep well the night of the study and that her mask felt loose  She also wants to discuss pred taper prescribed today  She wants to take the pred to help with asthma, but states needs a "much, much lower dose"  She experiences knee and elbow pain "from the swelling related to prednisone" when she takes high doses  Please advise thanks! Allergies  Allergen Reactions  . Azithromycin   . Cefaclor   . Iodine   . Levofloxacin   . Montelukast Sodium   . Penicillins   . Sulfonamide Derivatives    Current Outpatient Prescriptions on File Prior to Visit  Medication Sig Dispense Refill  . albuterol (PROAIR HFA) 108 (90 BASE) MCG/ACT inhaler Inhale 2 puffs into the lungs every 6 (six) hours as needed for wheezing.  3 Inhaler  3  . albuterol (PROVENTIL) (2.5 MG/3ML) 0.083% nebulizer solution Take 3 mLs (2.5 mg total) by nebulization 4 (four) times daily as needed for wheezing.  120 vial  12  . benzonatate (TESSALON) 200 MG capsule 1 capsule every 4 (four) hours as needed.      . calcium gluconate 500 MG tablet Take 500 mg by mouth 2 (two) times daily.        . cholecalciferol (VITAMIN D) 1000 UNITS tablet Take 1,000 Units by mouth daily.      . clarithromycin (BIAXIN) 500 MG tablet Take 1 tablet (500 mg total) by mouth 2 (two) times daily.  28 tablet  1  . clopidogrel (PLAVIX) 75 MG tablet Take 75 mg by mouth daily.        Marland Kitchen. dexlansoprazole (DEXILANT) 60 MG capsule Take 60 mg by mouth daily.        Marland Kitchen. EPINEPHrine (EPI-PEN) 0.3 mg/0.3 mL SOAJ injection Inject into thigh for severe allergic reaction  1 Device  prn  . fish oil-omega-3 fatty acids 1000 MG capsule 4 by mouth daily       . Flaxseed, Linseed, (FLAX SEED OIL) 1000 MG CAPS Take 1 capsule by mouth daily.      . fluticasone (FLONASE) 50 MCG/ACT nasal spray Place 2 sprays into both nostrils daily.  3 g  3  .  Fluticasone-Salmeterol (ADVAIR DISKUS) 100-50 MCG/DOSE AEPB Inhale 1 puff and rinse mouth, twice daily  3 each  3  . IOPHEN C-NR 100-10 MG/5ML syrup 5 mLs as needed.      . loratadine (CLARITIN) 10 MG tablet Take 1 tablet (10 mg total) by mouth daily.  90 tablet  3  . losartan (COZAAR) 50 MG tablet Take 50 mg by mouth daily.        Marland Kitchen. olopatadine (PATANOL) 0.1 % ophthalmic solution Place 1 drop into both eyes 2 (two) times daily.        Marland Kitchen. omalizumab (XOLAIR) 150 MG injection Inject into the skin every 28 (twenty-eight) days.        . predniSONE (DELTASONE) 10 MG tablet Take as directed for asthma  100 tablet  0  . pyridoxine (B-6) 100 MG tablet Take 100 mg by mouth daily.        . rosuvastatin (CRESTOR) 5 MG tablet Take 1/2 tablet every other day and 1 tablet every other day      . vitamin B-12 (CYANOCOBALAMIN) 1000 MCG tablet Take 1,000 mcg by mouth daily.  No current facility-administered medications on file prior to visit.

## 2013-09-09 NOTE — Telephone Encounter (Signed)
Patient calling back wanting to discuss prednisone dosage.

## 2013-09-09 NOTE — Telephone Encounter (Signed)
Spoke with the pt and notified of recs per CDY  She verbalized understanding and denied any further questions

## 2013-09-09 NOTE — Patient Instructions (Signed)
[facility]  Refill scripts printed for Prednisone-  Take # 20: 4 X 2 DAYS, 3 X 2 DAYS, 2 X 2 DAYS, 1 X 2 DAYS                                                                and  Epipen  Neb xop 0.63

## 2013-09-10 ENCOUNTER — Ambulatory Visit: Payer: Medicare Other

## 2013-09-11 ENCOUNTER — Telehealth: Payer: Self-pay | Admitting: Internal Medicine

## 2013-09-11 NOTE — Telephone Encounter (Signed)
Pt advised. Staley Lunz, CMA  

## 2013-09-11 NOTE — Telephone Encounter (Signed)
If she can tolerate it, she should try going back to the original way I wrote her prednisone taper, so she is taking twice as much per day as she is taking now.

## 2013-09-11 NOTE — Telephone Encounter (Signed)
Last OV 09-09-13. Spoke with the pt and she states she asked to be put on a lower dose of prednisone because she does not tolerate it well so she was advised to take 20mg  x2, 15mg  x2, 10x2, 5x2. Pt is currently on 15mg . She states she feels her wheezing is some better, but she is still having a lot of coughing and overall does not feel well. She states she usually bounces back faster then this. She also states that she went to her PCP yesterday and she listened to her lungs and mentioned that they "sounded awful." Pt wonders if the lower dose of prednisone was not the right thing to do. Should she increase it? Is something else needed? Please advise. Carron CurieJennifer Castillo, CMA Allergies  Allergen Reactions  . Azithromycin   . Cefaclor   . Iodine   . Levofloxacin   . Montelukast Sodium   . Penicillins   . Sulfonamide Derivatives

## 2013-09-17 ENCOUNTER — Ambulatory Visit (INDEPENDENT_AMBULATORY_CARE_PROVIDER_SITE_OTHER): Payer: Medicare Other

## 2013-09-17 DIAGNOSIS — J45909 Unspecified asthma, uncomplicated: Secondary | ICD-10-CM

## 2013-09-18 MED ORDER — OMALIZUMAB 150 MG ~~LOC~~ SOLR
150.0000 mg | Freq: Once | SUBCUTANEOUS | Status: AC
  Administered 2013-09-18: 150 mg via SUBCUTANEOUS

## 2013-10-06 NOTE — Assessment & Plan Note (Addendum)
Acute bronchitic exacerbation Plan prednisone taper, nebulizer treatment

## 2013-10-15 ENCOUNTER — Ambulatory Visit: Payer: Medicare Other

## 2013-10-16 ENCOUNTER — Ambulatory Visit (INDEPENDENT_AMBULATORY_CARE_PROVIDER_SITE_OTHER): Payer: Medicare Other

## 2013-10-16 DIAGNOSIS — J45909 Unspecified asthma, uncomplicated: Secondary | ICD-10-CM

## 2013-10-18 MED ORDER — OMALIZUMAB 150 MG ~~LOC~~ SOLR
150.0000 mg | Freq: Once | SUBCUTANEOUS | Status: AC
Start: 2013-10-18 — End: 2013-10-18
  Administered 2013-10-18: 150 mg via SUBCUTANEOUS

## 2013-11-13 ENCOUNTER — Ambulatory Visit (INDEPENDENT_AMBULATORY_CARE_PROVIDER_SITE_OTHER): Payer: Medicare Other

## 2013-11-13 DIAGNOSIS — J45909 Unspecified asthma, uncomplicated: Secondary | ICD-10-CM

## 2013-11-18 MED ORDER — OMALIZUMAB 150 MG ~~LOC~~ SOLR
150.0000 mg | Freq: Once | SUBCUTANEOUS | Status: AC
  Administered 2013-11-18: 150 mg via SUBCUTANEOUS

## 2013-12-11 ENCOUNTER — Ambulatory Visit (INDEPENDENT_AMBULATORY_CARE_PROVIDER_SITE_OTHER): Payer: Medicare Other

## 2013-12-11 DIAGNOSIS — J45909 Unspecified asthma, uncomplicated: Secondary | ICD-10-CM

## 2013-12-13 MED ORDER — OMALIZUMAB 150 MG ~~LOC~~ SOLR
150.0000 mg | Freq: Once | SUBCUTANEOUS | Status: AC
  Administered 2013-12-13: 150 mg via SUBCUTANEOUS

## 2014-01-06 ENCOUNTER — Ambulatory Visit (INDEPENDENT_AMBULATORY_CARE_PROVIDER_SITE_OTHER): Payer: Medicare Other | Admitting: Internal Medicine

## 2014-01-06 ENCOUNTER — Encounter: Payer: Self-pay | Admitting: Internal Medicine

## 2014-01-06 VITALS — BP 146/82 | HR 58 | Ht 61.0 in | Wt 177.2 lb

## 2014-01-06 DIAGNOSIS — G4733 Obstructive sleep apnea (adult) (pediatric): Secondary | ICD-10-CM

## 2014-01-06 DIAGNOSIS — J449 Chronic obstructive pulmonary disease, unspecified: Secondary | ICD-10-CM

## 2014-01-06 NOTE — Patient Instructions (Signed)
We can continue Xolair

## 2014-01-06 NOTE — Progress Notes (Signed)
Subjective:    Patient ID: Debra Byrd, female    DOB: 04/20/1946, 68 y.o.   MRN: [id number]  HPI [age] yoF never smoker followed for hx of chronic obstructive asthma which has often been steroid dependent, complicated by allergic rhinitis, suspected vocal cord dysfunction syndrome. Last here- October 20/2011 for preop assessment. She ended up deferring shoulder surgeries because of Orthopedist's cautions due to her asthma. She got a [facility] opinion at [facility] and has improved with Physical Therapy, so surgery is not planned now.  She started herself on antibiotic and prednisone in February for sinusitis and f/u w/ Dr Benedetto GoadShevlin- resolved. March 17 had difficulty getting deep breath. Went to [facility]- had CXR,  Leg Dopplers, Stress test. Finally dx'd "pleurisy". A neb helped there, suggesting some mild asthma exacerbation w/o overt bronchospasm. Uses rescue inhaler 1-3x/ week. She used it today walking from parking lot.   Continues Xolair injections, credited for significant improvement. Diagnosed with meningioma.  06/28/11- 64 yoF never smoker followed for hx of chronic obstructive asthma which has often been steroid dependent, complicated by allergic rhinitis, suspected vocal cord dysfunction syndrome.  She continues Xolair injections and her Advair 100. At times she thinks she needs stronger Advair and we discussed this. She has definitely felt more stable in the last few years on Xolair. No prednisone since early summer when Dr.Shevlin gave it for allergic conjunctivitis. She did did have an episode of sinusitis early in the fall treated at an urgent care.  12/27/11-  64 yoF never smoker followed for hx of chronic obstructive asthma which has often been steroid dependent, complicated by allergic rhinitis, suspected vocal cord dysfunction syndrome.  Denies any wheezing,SOB, cough, or congestion at this time; still on Xolair and doing well. Had some increased age and cough during pollen season. Continues  Xolair therapy and Claritin with Flonase are sufficient. She stayed with Advair 100 and rarely needs her rescue inhaler. Occasional mild wheeze which she feels in her throat. PFT 01/28/2010 was reviewed with her-mild obstructive airways disease with response to bronchodilator. FEV1/FVC 0.72.  07/03/12- 66 yoF never smoker followed for hx of chronic obstructive asthma which has often been steroid dependent, complicated by allergic rhinitis, peripheral neuropathy, glaucoma, OSA  Continues Xolair once per month. Continues Advair 100. Had sinusitis in October treated with doxycycline. Feels breathing is well controlled with only occasional wheeze. Minor scratchy throat today. She wants Biaxin and prednisone to hold, emphasizing that she tolerates Biaxin well. Has noticed soreness upper anterior ribs with cough or deep breath, nonfocal.  01/07/13- 66 yoF never smoker followed for hx of chronic obstructive asthma which has often been steroid dependent, complicated by allergic rhinitis, peripheral neuropathy, glaucoma, OSA FOLLOWS FOR: has 1 day left of abx for asthma flare up; continues Xolair injections and doing well with them. Resolving acute bronchitis, now on prednisone and Biaxin. She wants prescriptions to hold for these which we discussed.-  07/10/13-67 yoF never smoker followed for hx of chronic obstructive asthma/ Xolair which has often been steroid dependent, complicated by allergic rhinitis, peripheral neuropathy, glaucoma, OSA FOLLOWS FOR:  Breathing is unchanged since last OV.  Doing well on Xolair Has been more sleepy. Her primary physician asked about an update of her sleep study. Bedtime around 10 PM with short sleep latency. Wakes around 2 AM for bathroom. Husband wears CPAP and has restless sleep bothers her. She wakes tired about 6 or 7 AM. No naps. Little caffeine. Some lightheaded/dizzy NPSG 11/01/04 AHI 6.4/ hr. She  has never used CPAP. Now on Biaxin for sinus infection. No wheezing.  Likes Xolair.  09/09/13- 67 yoF never smoker followed for hx of chronic obstructive asthma/ Xolair which has often been steroid dependent, complicated by allergic rhinitis, peripheral neuropathy, glaucoma, OSA FOLLOWS FOR:  c/o:  sinus infection that has turned in to bronchitis.  Still having cough and mucus--thick and white in color.  Finished round of antibiotcs, still taking pred 10mg  daily  01/06/14-  67 yoF never smoker followed for hx of chronic obstructive asthma/ Xolair which has often been steroid dependent, complicated by allergic rhinitis, peripheral neuropathy, glaucoma, OSA FOLLOWS FOR: Had a rough winter; doing well with Xolair injections every 4 weeks. Cardiologist told her okay to use full strength EpiPen if she ever needed to use it. Going to visit [place]-discussed travel and altitude.  ROS-see HPI Constitutional:   No-   weight loss, night sweats, fevers, chills, +fatigue, lassitude. HEENT:   No-   difficulty swallowing, tooth/dental problems, sore throat,  + L frontal facial pain- neurologist calls it "atypical facial pain"      No-  sneezing, itching, ear ache, nasal congestion, post nasal drip,  CV:  + Noncardiac chest pain, no- orthopnea, PND, swelling in lower extremities, anasarca, dizziness, palpitations Resp:+shortness of breath with exertion or at rest.              + productive cough,  + non-productive cough,  No- coughing up of blood.              No-   change in color of mucus.  +Little wheezing.   Skin: No-   rash or lesions. GI:  No-   heartburn, indigestion, abdominal pain, nausea, vomiting,  GU: pain. MS:  No-   joint pain or swelling.   Neuro-     nothing unusualk pain. Psych:  No- change in mood or affect. No depression or anxiety.  No memory loss.   Objective:   Physical Exam General- Alert, Oriented, Affect-appropriate, Distress- none acute; overweight, NAD Skin- rash-none, lesions- none, excoriation- none Lymphadenopathy- none Head-  atraumatic            Eyes- Gross vision intact, PERRLA, conjunctivae clear secretions. Chronic periorbital edema.            Ears- Hearing, canals-normal            Nose- Clear, no-Septal dev, mucus, polyps, erosion, perforation             Throat- Mallampati II-III , mucosa clear , drainage- none, tonsils- atrophic Neck- flexible , trachea midline, no stridor , thyroid nl, carotid no bruit Chest - symmetrical excursion , unlabored           Heart/CV- RRR , no murmur , no gallop  , no rub, nl s1 s2                           - JVD- none , edema- none, stasis changes- none, varices- none           Lung- clear, unlabored, dullness-none, rub- none           Chest wall-  Abd-  Br/ Gen/ Rectal- Not done, not indicated Extrem- cyanosis- none, clubbing, none, atrophy- none, strength- nl Neuro- grossly intact to observation

## 2014-01-08 ENCOUNTER — Ambulatory Visit (INDEPENDENT_AMBULATORY_CARE_PROVIDER_SITE_OTHER): Payer: Medicare Other

## 2014-01-08 DIAGNOSIS — J45909 Unspecified asthma, uncomplicated: Secondary | ICD-10-CM

## 2014-01-10 MED ORDER — OMALIZUMAB 150 MG ~~LOC~~ SOLR
150.0000 mg | Freq: Once | SUBCUTANEOUS | Status: AC
  Administered 2014-01-10: 150 mg via SUBCUTANEOUS

## 2014-02-05 ENCOUNTER — Ambulatory Visit (INDEPENDENT_AMBULATORY_CARE_PROVIDER_SITE_OTHER): Payer: Medicare Other

## 2014-02-05 DIAGNOSIS — J449 Chronic obstructive pulmonary disease, unspecified: Secondary | ICD-10-CM

## 2014-02-06 MED ORDER — OMALIZUMAB 150 MG ~~LOC~~ SOLR
150.0000 mg | Freq: Once | SUBCUTANEOUS | Status: AC
  Administered 2014-02-06: 150 mg via SUBCUTANEOUS

## 2014-02-22 NOTE — Assessment & Plan Note (Signed)
controlled 

## 2014-02-22 NOTE — Assessment & Plan Note (Signed)
Currently well controlled on maintenance prednisone

## 2014-02-22 NOTE — Assessment & Plan Note (Signed)
managed

## 2014-02-26 ENCOUNTER — Telehealth: Payer: Self-pay | Admitting: Internal Medicine

## 2014-02-26 NOTE — Telephone Encounter (Signed)
Paperwork and message on CY's cart to complete. Will fax back and inform patient as soon as this is completed.

## 2014-02-27 NOTE — Telephone Encounter (Signed)
[facility] Copy made for our records and originals sent to patient-husband made aware of this.

## 2014-03-05 ENCOUNTER — Ambulatory Visit (INDEPENDENT_AMBULATORY_CARE_PROVIDER_SITE_OTHER): Payer: Medicare Other

## 2014-03-05 DIAGNOSIS — J4489 Other specified chronic obstructive pulmonary disease: Secondary | ICD-10-CM

## 2014-03-05 DIAGNOSIS — J449 Chronic obstructive pulmonary disease, unspecified: Secondary | ICD-10-CM

## 2014-03-06 MED ORDER — OMALIZUMAB 150 MG ~~LOC~~ SOLR
150.0000 mg | Freq: Once | SUBCUTANEOUS | Status: AC
  Administered 2014-03-06: 150 mg via SUBCUTANEOUS

## 2014-04-02 ENCOUNTER — Ambulatory Visit (INDEPENDENT_AMBULATORY_CARE_PROVIDER_SITE_OTHER): Payer: Medicare Other

## 2014-04-02 DIAGNOSIS — J449 Chronic obstructive pulmonary disease, unspecified: Secondary | ICD-10-CM | POA: Diagnosis not present

## 2014-04-02 MED ORDER — OMALIZUMAB 150 MG ~~LOC~~ SOLR
150.0000 mg | Freq: Once | SUBCUTANEOUS | Status: AC
  Administered 2014-04-02: 150 mg via SUBCUTANEOUS

## 2014-04-29 ENCOUNTER — Telehealth: Payer: Self-pay | Admitting: Internal Medicine

## 2014-04-29 NOTE — Telephone Encounter (Signed)
Should be okay to get Xolair on schedule

## 2014-04-29 NOTE — Telephone Encounter (Signed)
Pt. Is coming in for xolair 04/30/14. (per Dr.Young)

## 2014-04-29 NOTE — Telephone Encounter (Signed)
Debra Byrd,Debra Byrd has had a sore throat,dry cough,no fever since 04/24/14.(last Thurs.) She wants to know can she still get her xolair shot 04/30/14? She's on the schedule for tomorrow. Please advise this afternoon if possible so I can call her back and let her know. Thank you! Tammy Scott

## 2014-04-30 ENCOUNTER — Telehealth: Payer: Self-pay | Admitting: Internal Medicine

## 2014-04-30 ENCOUNTER — Ambulatory Visit: Payer: Medicare Other

## 2014-04-30 NOTE — Telephone Encounter (Signed)
Ok to get Xolair when she is feeling better

## 2014-04-30 NOTE — Telephone Encounter (Signed)
Pt is aware. Nothing further needed 

## 2014-04-30 NOTE — Telephone Encounter (Signed)
Called spoke with patient who reported that she spoke with TS yesterday questioning getting her Xolair today - was advised is okay per CY.  Per patient she woke up this morning a little worse with increased rawness in her chest, increased PND and throat clearing, clear head drainage, chest congestion, a little bit of wheezing.  She did wake up during the night with coughing.  Denies any f/c/s still.  Pt reports she is a little afraid to come for her Xolair today as she is unsure if this is "just allergies or what".  Has been taking 2 Claritins a day, Flonase and Advair.  She will begin Mucinex today.  Dr Maple Hudson please advise, thank you.

## 2014-04-30 NOTE — Telephone Encounter (Signed)
Called spoke with patient and advised of Debra Byrd's recs to skip her Xolair this time Pt stated that she takes Xolair once monthly and is concerned about waiting until October (last was 4 weeks ago today, exactly) Pt is asking if she can try for next week  Dr Maple Hudson please advise, thank you!

## 2014-04-30 NOTE — Telephone Encounter (Signed)
Suggest we skip Xolair this time. We are seeing a lot of a viral bronchitis around and that is most likely what she has.

## 2014-05-06 ENCOUNTER — Ambulatory Visit (INDEPENDENT_AMBULATORY_CARE_PROVIDER_SITE_OTHER): Payer: Medicare Other

## 2014-05-06 DIAGNOSIS — J449 Chronic obstructive pulmonary disease, unspecified: Secondary | ICD-10-CM | POA: Diagnosis not present

## 2014-05-16 MED ORDER — OMALIZUMAB 150 MG ~~LOC~~ SOLR
150.0000 mg | Freq: Once | SUBCUTANEOUS | Status: AC
  Administered 2014-05-16: 150 mg via SUBCUTANEOUS

## 2014-06-03 ENCOUNTER — Ambulatory Visit: Payer: Medicare Other

## 2014-06-04 ENCOUNTER — Ambulatory Visit (INDEPENDENT_AMBULATORY_CARE_PROVIDER_SITE_OTHER): Payer: Medicare Other

## 2014-06-04 DIAGNOSIS — J449 Chronic obstructive pulmonary disease, unspecified: Secondary | ICD-10-CM

## 2014-06-11 MED ORDER — OMALIZUMAB 150 MG ~~LOC~~ SOLR
150.0000 mg | Freq: Once | SUBCUTANEOUS | Status: AC
  Administered 2014-06-11: 150 mg via SUBCUTANEOUS

## 2014-07-02 ENCOUNTER — Ambulatory Visit (INDEPENDENT_AMBULATORY_CARE_PROVIDER_SITE_OTHER): Payer: Medicare Other

## 2014-07-02 DIAGNOSIS — J449 Chronic obstructive pulmonary disease, unspecified: Secondary | ICD-10-CM

## 2014-07-09 ENCOUNTER — Ambulatory Visit (INDEPENDENT_AMBULATORY_CARE_PROVIDER_SITE_OTHER): Payer: Medicare Other | Admitting: Internal Medicine

## 2014-07-09 ENCOUNTER — Encounter: Payer: Self-pay | Admitting: Internal Medicine

## 2014-07-09 VITALS — BP 116/70 | HR 64 | Ht 61.0 in | Wt 182.6 lb

## 2014-07-09 DIAGNOSIS — J302 Other seasonal allergic rhinitis: Secondary | ICD-10-CM

## 2014-07-09 DIAGNOSIS — J309 Allergic rhinitis, unspecified: Secondary | ICD-10-CM

## 2014-07-09 DIAGNOSIS — J4489 Other specified chronic obstructive pulmonary disease: Secondary | ICD-10-CM

## 2014-07-09 DIAGNOSIS — J449 Chronic obstructive pulmonary disease, unspecified: Secondary | ICD-10-CM

## 2014-07-09 DIAGNOSIS — J3089 Other allergic rhinitis: Secondary | ICD-10-CM

## 2014-07-09 DIAGNOSIS — R490 Dysphonia: Secondary | ICD-10-CM

## 2014-07-09 MED ORDER — OMALIZUMAB 150 MG ~~LOC~~ SOLR
150.0000 mg | Freq: Once | SUBCUTANEOUS | Status: AC
  Administered 2014-07-02: 150 mg via SUBCUTANEOUS

## 2014-07-09 MED ORDER — MOMETASONE FURO-FORMOTEROL FUM 100-5 MCG/ACT IN AERO: INHALATION_SPRAY | RESPIRATORY_TRACT | Status: DC

## 2014-07-09 MED ORDER — AEROCHAMBER MV MISC: Status: DC

## 2014-07-09 NOTE — Patient Instructions (Signed)
Sample and script to try Dulera 100 as an alternative to Advair       2 puffs through the Aerochamber spacer, twice daily, then rinse mouth

## 2014-07-09 NOTE — Progress Notes (Signed)
Subjective:    Patient ID: Debra Byrd, female    DOB: 04/20/1946, 68 y.o.   MRN: [id number]  HPI [age] yoF never smoker followed for hx of chronic obstructive asthma which has often been steroid dependent, complicated by allergic rhinitis, suspected vocal cord dysfunction syndrome. Last here- October 20/2011 for preop assessment. She ended up deferring shoulder surgeries because of Orthopedist's cautions due to her asthma. [facility] and has improved with Physical Therapy, so surgery is not planned now.  She started herself on antibiotic and prednisone in February for sinusitis and f/u w/ Dr Benedetto GoadShevlin- resolved. March 17 had difficulty getting deep breath. [facility]- [facility] CX[facility],  Leg Dopplers, Stress test. Finally dx'd "pleurisy". A neb helped there, suggesting some mild asthma exacerbation w/o overt bronchospasm. Uses rescue inhaler 1-3x/ week. She used it today walking from parking lot.   Continues Xolair injections, credited for significant improvement. Diagnosed with meningioma.  06/28/11- 64 yoF never smoker followed for hx of chronic obstructive asthma which has often been steroid dependent, complicated by allergic rhinitis, suspected vocal cord dysfunction syndrome.  She continues Xolair injections and her Advair 100. At times she thinks she needs stronger Advair and we discussed this. She has definitely felt more stable in the last few years on Xolair. No prednisone since early summer when Dr.Shevlin gave it for allergic conjunctivitis. She did did have an episode of sinusitis early in the fall treated at an urgent care.  12/27/11-  64 yoF never smoker followed for hx of chronic obstructive asthma which has often been steroid dependent, complicated by allergic rhinitis, suspected vocal cord dysfunction syndrome.  Denies any wheezing,SOB, cough, or congestion at this time; still on Xolair and doing well. Had some increased age and cough during pollen season. Continues  Xolair therapy and Claritin with Flonase are sufficient. She stayed with Advair 100 and rarely needs her rescue inhaler. Occasional mild wheeze which she feels in her throat. PFT 01/28/2010 was reviewed with her-mild obstructive airways disease with response to bronchodilator. FEV1/FVC 0.72.  07/03/12- 66 yoF never smoker followed for hx of chronic obstructive asthma which has often been steroid dependent, complicated by allergic rhinitis, peripheral neuropathy, glaucoma, OSA  Continues Xolair once per month. Continues Advair 100. Had sinusitis in October treated with doxycycline. Feels breathing is well controlled with only occasional wheeze. Minor scratchy throat today. She wants Biaxin and prednisone to hold, emphasizing that she tolerates Biaxin well. Has noticed soreness upper anterior ribs with cough or deep breath, nonfocal.  01/07/13- 66 yoF never smoker followed for hx of chronic obstructive asthma which has often been steroid dependent, complicated by allergic rhinitis, peripheral neuropathy, glaucoma, OSA FOLLOWS FOR: has 1 day left of abx for asthma flare up; continues Xolair injections and doing well with them. Resolving acute bronchitis, now on prednisone and Biaxin. She wants prescriptions to hold for these which we discussed.-  07/10/13-67 yoF never smoker followed for hx of chronic obstructive asthma/ Xolair which has often been steroid dependent, complicated by allergic rhinitis, peripheral neuropathy, glaucoma, OSA FOLLOWS FOR:  Breathing is unchanged since last OV.  Doing well on Xolair Has been more sleepy. Her primary physician asked about an update of her sleep study. Bedtime around 10 PM with short sleep latency. Wakes around 2 AM for bathroom. Husband wears CPAP and has restless sleep bothers her. She wakes tired about 6 or 7 AM. No naps. Little caffeine. Some lightheaded/dizzy NPSG 11/01/04 AHI 6.4/ hr. She  has never used CPAP. Now on Biaxin for sinus infection. No wheezing.  Likes Xolair.  09/09/13- 67 yoF never smoker followed for hx of chronic obstructive asthma/ Xolair which has often been steroid dependent, complicated by allergic rhinitis, peripheral neuropathy, glaucoma, OSA FOLLOWS FOR:  c/o:  sinus infection that has turned in to bronchitis.  Still having cough and mucus--thick and white in color.  Finished round of antibiotcs, still taking pred 10mg  daily  01/06/14-  67 yoF never smoker followed for hx of chronic obstructive asthma/ Xolair which has often been steroid dependent, complicated by allergic rhinitis, peripheral neuropathy, glaucoma, OSA FOLLOWS FOR: Had a rough winter; doing well with Xolair injections every 4 weeks. Cardiologist told her okay to use full strength EpiPen if she ever needed to use it. Going [place]discussed travel and altitude.  07/09/14-67 yoF never smoker followed for hx of chronic obstructive asthma/ Xolair which has often been steroid dependent, complicated by allergic rhinitis, peripheral neuropathy, glaucoma, OSA FOLLOWS FOR: still on Xolair and doing well. Sharion SettlerXoalir big help. Feels well. Baseline nasal drainage. ENT eval for hoarseness. Having to raise voice to speak to deaf mother. Discussed possible role of Advair. [facility] and use aerochamber to see if that helps.  ROS-see HPI- Constitutional:   No-   weight loss, night sweats, fevers, chills, +fatigue, lassitude. HEENT:   No-   difficulty swallowing, tooth/dental problems, sore throat,       No-  sneezing, itching, ear ache, nasal congestion, +post nasal drip,  CV:  + Noncardiac chest pain, no- orthopnea, PND, swelling in lower extremities,                 anasarca, dizziness, palpitations Resp:+shortness of breath with exertion or at rest.              + productive cough,  + non-productive cough,  No- coughing up of blood.              No-   change in color of mucus.  +Little wheezing.   Skin: No-   rash or lesions. GI:  No-   heartburn,  indigestion, abdominal pain, nausea, vomiting,  GU: pain. MS:  No-   joint pain or swelling.   Neuro-     nothing unusualk pain. Psych:  No- change in mood or affect. No depression or anxiety.  No memory loss.   Objective:   Physical Exam General- Alert, Oriented, Affect-appropriate, Distress- none acute; overweight, NAD Skin- rash-none, lesions- none, excoriation- none Lymphadenopathy- none Head- atraumatic            Eyes- Gross vision intact, PERRLA, conjunctivae clear secretions. Chronic                     periorbital edema.            Ears- Hearing, canals-normal            Nose- Clear, no-Septal dev, mucus, polyps, erosion, perforation             Throat- Mallampati II-III , mucosa clear , drainage- none, tonsils- atrophic Neck- flexible , trachea midline, no stridor , thyroid nl, carotid no bruit Chest - symmetrical excursion , unlabored           Heart/CV- RRR , no murmur , no gallop  , no rub, nl s1 s2                           -  JVD- none , edema- none, stasis changes- none, varices- none           Lung- clear, unlabored, dullness-none, rub- none           Chest wall-  Abd-  Br/ Gen/ Rectal- Not done, not indicated Extrem- cyanosis- none, clubbing, none, atrophy- none, strength- nl Neuro- grossly intact to observation

## 2014-07-12 NOTE — Assessment & Plan Note (Signed)
Previous ENT eval Try spacer tube with steroid inhaler to see if that helps.

## 2014-07-12 NOTE — Assessment & Plan Note (Signed)
Advair has done well, but she agrees to try changing to a maintenance med she can use a spacer with.  [facility] with aero[facility]amber

## 2014-07-12 NOTE — Assessment & Plan Note (Signed)
Ok to continue Xolair and antihistamines

## 2014-07-30 ENCOUNTER — Ambulatory Visit (INDEPENDENT_AMBULATORY_CARE_PROVIDER_SITE_OTHER): Payer: Medicare Other

## 2014-07-30 DIAGNOSIS — J449 Chronic obstructive pulmonary disease, unspecified: Secondary | ICD-10-CM

## 2014-07-31 MED ORDER — OMALIZUMAB 150 MG ~~LOC~~ SOLR
150.0000 mg | Freq: Once | SUBCUTANEOUS | Status: AC
  Administered 2014-07-30: 150 mg via SUBCUTANEOUS

## 2014-08-27 ENCOUNTER — Ambulatory Visit: Payer: Medicare Other

## 2014-08-28 ENCOUNTER — Ambulatory Visit (INDEPENDENT_AMBULATORY_CARE_PROVIDER_SITE_OTHER): Payer: Medicare Other

## 2014-08-28 DIAGNOSIS — J449 Chronic obstructive pulmonary disease, unspecified: Secondary | ICD-10-CM | POA: Diagnosis not present

## 2014-08-29 MED ORDER — OMALIZUMAB 150 MG ~~LOC~~ SOLR
150.0000 mg | Freq: Once | SUBCUTANEOUS | Status: AC
  Administered 2014-08-28: 150 mg via SUBCUTANEOUS

## 2014-09-25 ENCOUNTER — Ambulatory Visit (INDEPENDENT_AMBULATORY_CARE_PROVIDER_SITE_OTHER): Payer: Medicare Other

## 2014-09-25 DIAGNOSIS — J4540 Moderate persistent asthma, uncomplicated: Secondary | ICD-10-CM | POA: Diagnosis not present

## 2014-09-25 MED ORDER — OMALIZUMAB 150 MG ~~LOC~~ SOLR
150.0000 mg | Freq: Once | SUBCUTANEOUS | Status: AC
  Administered 2014-09-25: 150 mg via SUBCUTANEOUS

## 2014-10-23 ENCOUNTER — Ambulatory Visit (INDEPENDENT_AMBULATORY_CARE_PROVIDER_SITE_OTHER): Payer: Medicare Other

## 2014-10-23 DIAGNOSIS — J4540 Moderate persistent asthma, uncomplicated: Secondary | ICD-10-CM

## 2014-10-23 MED ORDER — OMALIZUMAB 150 MG ~~LOC~~ SOLR
150.0000 mg | Freq: Once | SUBCUTANEOUS | Status: AC
  Administered 2014-10-23: 150 mg via SUBCUTANEOUS

## 2014-11-18 ENCOUNTER — Other Ambulatory Visit: Payer: Self-pay | Admitting: Orthopaedic Surgery

## 2014-11-18 DIAGNOSIS — M545 Low back pain: Secondary | ICD-10-CM

## 2014-11-20 ENCOUNTER — Ambulatory Visit (INDEPENDENT_AMBULATORY_CARE_PROVIDER_SITE_OTHER): Payer: Medicare Other

## 2014-11-20 ENCOUNTER — Ambulatory Visit
Admission: RE | Admit: 2014-11-20 | Discharge: 2014-11-20 | Disposition: A | Discharge status: Home / Self Care | Payer: Medicare Other | Source: Ambulatory Visit | Attending: Orthopaedic Surgery | Admitting: Orthopaedic Surgery

## 2014-11-20 DIAGNOSIS — M545 Low back pain: Secondary | ICD-10-CM

## 2014-11-20 DIAGNOSIS — J449 Chronic obstructive pulmonary disease, unspecified: Secondary | ICD-10-CM

## 2014-11-21 MED ORDER — OMALIZUMAB 150 MG ~~LOC~~ SOLR
150.0000 mg | Freq: Once | SUBCUTANEOUS | Status: AC
  Administered 2014-11-20: 150 mg via SUBCUTANEOUS

## 2014-11-28 ENCOUNTER — Other Ambulatory Visit: Payer: Medicare Other

## 2014-12-18 ENCOUNTER — Telehealth: Payer: Self-pay | Admitting: Internal Medicine

## 2014-12-18 ENCOUNTER — Ambulatory Visit (INDEPENDENT_AMBULATORY_CARE_PROVIDER_SITE_OTHER): Payer: Medicare Other

## 2014-12-18 DIAGNOSIS — J4540 Moderate persistent asthma, uncomplicated: Secondary | ICD-10-CM

## 2014-12-18 MED ORDER — LORATADINE 10 MG PO TABS: 10.0000 mg | ORAL_TABLET | Freq: Every day | ORAL | Status: DC

## 2014-12-18 MED ORDER — FLUTICASONE PROPIONATE 50 MCG/ACT NA SUSP: 2.0000 | Freq: Every day | NASAL | Status: DC

## 2014-12-18 MED ORDER — ALBUTEROL SULFATE HFA 108 (90 BASE) MCG/ACT IN AERS: 2.0000 | INHALATION_SPRAY | Freq: Four times a day (QID) | RESPIRATORY_TRACT | Status: DC | PRN

## 2014-12-18 MED ORDER — FLUTICASONE-SALMETEROL 100-50 MCG/DOSE IN AEPB: INHALATION_SPRAY | RESPIRATORY_TRACT | Status: DC

## 2014-12-18 NOTE — Telephone Encounter (Signed)
Called and spoke to pt. Pt requested refills on proair, claritin, advair, flonase. Rx printed for pick up, requested by pt. Rx placed up front for pick up. Pt aware. Nothing further needed at this time.

## 2014-12-18 NOTE — Telephone Encounter (Signed)
Pt returned call [phone]

## 2014-12-18 NOTE — Telephone Encounter (Signed)
LMTC x 1  

## 2014-12-19 MED ORDER — OMALIZUMAB 150 MG ~~LOC~~ SOLR
150.0000 mg | Freq: Once | SUBCUTANEOUS | Status: AC
  Administered 2014-12-18: 150 mg via SUBCUTANEOUS

## 2014-12-29 ENCOUNTER — Telehealth: Payer: Self-pay | Admitting: Internal Medicine

## 2014-12-30 ENCOUNTER — Other Ambulatory Visit: Payer: Self-pay | Admitting: Internal Medicine

## 2015-01-01 NOTE — Telephone Encounter (Signed)
#   vials:1 Ordered date:12/29/14 Shipping Date:12/30/14   # Vials:1 Arrival Date:12/30/14 Lot #:[id number] Exp Date:12/19  

## 2015-01-13 ENCOUNTER — Ambulatory Visit: Payer: Medicare Other | Admitting: Internal Medicine

## 2015-01-15 ENCOUNTER — Ambulatory Visit (INDEPENDENT_AMBULATORY_CARE_PROVIDER_SITE_OTHER): Payer: Medicare Other | Admitting: Internal Medicine

## 2015-01-15 ENCOUNTER — Ambulatory Visit: Payer: Medicare Other

## 2015-01-15 VITALS — BP 100/66 | HR 70 | Ht 61.0 in | Wt 173.0 lb

## 2015-01-15 DIAGNOSIS — J4540 Moderate persistent asthma, uncomplicated: Secondary | ICD-10-CM | POA: Diagnosis not present

## 2015-01-15 DIAGNOSIS — J449 Chronic obstructive pulmonary disease, unspecified: Secondary | ICD-10-CM

## 2015-01-15 DIAGNOSIS — J302 Other seasonal allergic rhinitis: Secondary | ICD-10-CM

## 2015-01-15 DIAGNOSIS — J3089 Other allergic rhinitis: Secondary | ICD-10-CM

## 2015-01-15 DIAGNOSIS — J309 Allergic rhinitis, unspecified: Secondary | ICD-10-CM

## 2015-01-15 MED ORDER — ALBUTEROL SULFATE (2.5 MG/3ML) 0.083% IN NEBU: 2.5000 mg | INHALATION_SOLUTION | Freq: Four times a day (QID) | RESPIRATORY_TRACT | Status: DC | PRN

## 2015-01-15 NOTE — Progress Notes (Signed)
Subjective:    Patient ID: Debra Byrd, female    DOB: 04/20/1946, 69 y.o.   MRN: [id number]  HPI [age] yoF never smoker followed for hx of chronic obstructive asthma which has often been steroid dependent, complicated by allergic rhinitis, suspected vocal cord dysfunction syndrome. Last here- October 20/2011 for preop assessment. She ended up deferring shoulder surgeries because of Orthopedist's cautions due to her asthma. [facility] and has improved with Physical Therapy, so surgery is not planned now.  She started herself on antibiotic and prednisone in February for sinusitis and f/u w/ Dr Benedetto GoadShevlin- resolved. March 17 had difficulty getting deep breath. [facility] to [facility]- had CXR,  Leg Dopplers, Stress test. Finally dx'd "pleurisy". A neb helped there, suggesting some mild asthma exacerbation w/o overt bronchospasm. Uses rescue inhaler 1-3x/ week. She used it today walking from parking lot.   Continues Xolair injections, credited for significant improvement. Diagnosed with meningioma.  06/28/11- 64 yoF never smoker followed for hx of chronic obstructive asthma which has often been steroid dependent, complicated by allergic rhinitis, suspected vocal cord dysfunction syndrome.  She continues Xolair injections and her Advair 100. At times she thinks she needs stronger Advair and we discussed this. She has definitely felt more stable in the last few years on Xolair. No prednisone since early summer when Dr.Shevlin gave it for allergic conjunctivitis. She did did have an episode of sinusitis early in the fall treated at an urgent care.  12/27/11-  64 yoF never smoker followed for hx of chronic obstructive asthma which has often been steroid dependent, complicated by allergic rhinitis, suspected vocal cord dysfunction syndrome.  Denies any wheezing,SOB, cough, or congestion at this time; still on Xolair and doing well. Had some increased age and cough during pollen season. Continues  Xolair therapy and Claritin with Flonase are sufficient. She stayed with Advair 100 and rarely needs her rescue inhaler. Occasional mild wheeze which she feels in her throat. PFT 01/28/2010 was reviewed with her-mild obstructive airways disease with response to bronchodilator. FEV1/FVC 0.72.  07/03/12- 66 yoF never smoker followed for hx of chronic obstructive asthma which has often been steroid dependent, complicated by allergic rhinitis, peripheral neuropathy, glaucoma, OSA  Continues Xolair once per month. Continues Advair 100. Had sinusitis in October treated with doxycycline. Feels breathing is well controlled with only occasional wheeze. Minor scratchy throat today. She wants Biaxin and prednisone to hold, emphasizing that she tolerates Biaxin well. Has noticed soreness upper anterior ribs with cough or deep breath, nonfocal.  01/07/13- 66 yoF never smoker followed for hx of chronic obstructive asthma which has often been steroid dependent, complicated by allergic rhinitis, peripheral neuropathy, glaucoma, OSA FOLLOWS FOR: has 1 day left of abx for asthma flare up; continues Xolair injections and doing well with them. Resolving acute bronchitis, now on prednisone and Biaxin. She wants prescriptions to hold for these which we discussed.-  07/10/13-67 yoF never smoker followed for hx of chronic obstructive asthma/ Xolair which has often been steroid dependent, complicated by allergic rhinitis, peripheral neuropathy, glaucoma, OSA FOLLOWS FOR:  Breathing is unchanged since last OV.  Doing well on Xolair Has been more sleepy. Her primary physician asked about an update of her sleep study. Bedtime around 10 PM with short sleep latency. Wakes around 2 AM for bathroom. Husband wears CPAP and has restless sleep bothers her. She wakes tired about 6 or 7 AM. No naps. Little caffeine. Some lightheaded/dizzy NPSG 11/01/04 AHI 6.4/ hr. She  has never used CPAP. Now on Biaxin for sinus infection. No wheezing.  Likes Xolair.  09/09/13- 67 yoF never smoker followed for hx of chronic obstructive asthma/ Xolair which has often been steroid dependent, complicated by allergic rhinitis, peripheral neuropathy, glaucoma, OSA FOLLOWS FOR:  c/o:  sinus infection that has turned in to bronchitis.  Still having cough and mucus--thick and white in color.  Finished round of antibiotcs, still taking pred 10mg  daily  01/06/14-  67 yoF never smoker followed for hx of chronic obstructive asthma/ Xolair which has often been steroid dependent, complicated by allergic rhinitis, peripheral neuropathy, glaucoma, OSA FOLLOWS FOR: Had a rough winter; doing well with Xolair injections every 4 weeks. Cardiologist told her okay to use full strength EpiPen if she ever needed to use it. Going [place]discussed travel and altitude.  07/09/14-67 yoF never smoker followed for hx of chronic obstructive asthma/ Xolair which has often been steroid dependent, complicated by allergic rhinitis, peripheral neuropathy, glaucoma, OSA FOLLOWS FOR: still on Xolair and doing well. Sharion SettlerXoalir big help. Feels well. Baseline nasal drainage. ENT eval for hoarseness. Having to raise voice to speak to deaf mother. Discussed possible role of Advair. Can [facility] to [facility] and use aerochamber to see if that helps.  01/15/16- 68 yoF never smoker followed for hx of chronic obstructive asthma/ Xolair which has often been steroid dependent, complicated by allergic rhinitis, peripheral neuropathy, glaucoma, OSA Reports:Still doing well on the Xolair Very pleased with asthma control-no significant acute attack since last here Less frequent use of rescue inhaler. Asthma is not waking her at night.Marland Kitchen.  ROS-see HPI- Constitutional:   No-   weight loss, night sweats, fevers, chills, +fatigue, lassitude. HEENT:   No-   difficulty swallowing, tooth/dental problems, sore throat,       No-  sneezing, itching, ear ache, nasal congestion, +post nasal drip,  CV:  +  Noncardiac chest pain, no- orthopnea, PND, swelling in lower extremities,                 anasarca, dizziness, palpitations Resp:+shortness of breath with exertion or at rest.              + productive cough,  + non-productive cough,  No- coughing up of blood.              No-   change in color of mucus.  +Little wheezing.   Skin: No-   rash or lesions. GI:  No-   heartburn, indigestion, abdominal pain, nausea, vomiting,  GU: pain. MS:  No-   joint pain or swelling.   Neuro-     nothing unusualk pain. Psych:  No- change in mood or affect. No depression or anxiety.  No memory loss.   Objective:   Physical Exam General- Alert, Oriented, Affect-appropriate, Distress- none acute; overweight, NAD Skin- rash-none, lesions- none, excoriation- none Lymphadenopathy- none Head- atraumatic            Eyes- Gross vision intact, PERRLA, conjunctivae clear secretions. Chronic periorbital edema.            Ears- Hearing, canals-normal            Nose- Clear, no-Septal dev, mucus, polyps, erosion, perforation             Throat- Mallampati II-III , mucosa clear , drainage- none, tonsils- atrophic Neck- flexible , trachea midline, no stridor , thyroid nl, carotid no bruit Chest - symmetrical excursion , unlabored  Heart/CV- RRR , no murmur , no gallop  , no rub, nl s1 s2                           - JVD- none , edema- none, stasis changes- none, varices- none           Lung- clear, unlabored, dullness-none, rub- none           Chest wall-  Abd-  Br/ Gen/ Rectal- Not done, not indicated Extrem- cyanosis- none, clubbing, none, atrophy- none, strength- nl Neuro- grossly intact to observation

## 2015-01-15 NOTE — Patient Instructions (Signed)
Script sent refilling nebulizer solution  We can continue Xolair  Please call as needed

## 2015-01-17 ENCOUNTER — Encounter: Payer: Self-pay | Admitting: Internal Medicine

## 2015-01-17 NOTE — Assessment & Plan Note (Signed)
Moderate persistent asthma, well controlled Plan-refill prescription sent for nebulizer solution. Continues Xolair

## 2015-01-17 NOTE — Assessment & Plan Note (Signed)
OTC antihistamines as needed 

## 2015-01-21 MED ORDER — OMALIZUMAB 150 MG ~~LOC~~ SOLR
150.0000 mg | Freq: Once | SUBCUTANEOUS | Status: AC
  Administered 2015-01-16: 150 mg via SUBCUTANEOUS

## 2015-01-21 NOTE — Addendum Note (Signed)
Addended by: Reynaldo MiniumWELCHEL, KATIE C on: 01/21/2015 10:56 AM   Modules accepted: Orders

## 2015-01-23 ENCOUNTER — Telehealth: Payer: Self-pay | Admitting: Internal Medicine

## 2015-01-23 NOTE — Telephone Encounter (Signed)
#   vials:1 Ordered date:01/23/15 Shipping Date:01/23/15

## 2015-01-27 NOTE — Telephone Encounter (Signed)
#   Vials:1 Arrival Date:01/27/15 Lot #:[id number] Exp Date:1/20

## 2015-02-12 ENCOUNTER — Ambulatory Visit (INDEPENDENT_AMBULATORY_CARE_PROVIDER_SITE_OTHER): Payer: Medicare Other

## 2015-02-12 ENCOUNTER — Telehealth: Payer: Self-pay | Admitting: Internal Medicine

## 2015-02-12 DIAGNOSIS — J4540 Moderate persistent asthma, uncomplicated: Secondary | ICD-10-CM

## 2015-02-12 MED ORDER — OMALIZUMAB 150 MG ~~LOC~~ SOLR
150.0000 mg | Freq: Once | SUBCUTANEOUS | Status: AC
  Administered 2015-02-12: 150 mg via SUBCUTANEOUS

## 2015-02-12 NOTE — Telephone Encounter (Signed)
Handicap placard placed on CY's cart to fill out and sign.

## 2015-02-12 NOTE — Telephone Encounter (Signed)
Done

## 2015-02-13 NOTE — Telephone Encounter (Signed)
Found placard form on Debra Byrd's desk.  Handicap placard form came with self-addressed envelope written by pt with matching PO Box on file.  This has been placed in the mail in said envelope.  lmtcb X1 to make pt aware that this was being placed in mail today.

## 2015-02-13 NOTE — Telephone Encounter (Signed)
Pt aware.

## 2015-02-16 ENCOUNTER — Telehealth: Payer: Self-pay | Admitting: Internal Medicine

## 2015-02-16 NOTE — Telephone Encounter (Signed)
#   vials:1 Ordered date:02/16/15 Shipping Date:02/17/15 

## 2015-02-17 NOTE — Telephone Encounter (Signed)
#   Vials:1 Arrival Date:02/17/2015  Lot #:[id number] Exp Date:1/20  

## 2015-03-03 ENCOUNTER — Encounter: Payer: Self-pay | Admitting: Internal Medicine

## 2015-03-12 ENCOUNTER — Ambulatory Visit (INDEPENDENT_AMBULATORY_CARE_PROVIDER_SITE_OTHER): Payer: Medicare Other

## 2015-03-12 DIAGNOSIS — J4540 Moderate persistent asthma, uncomplicated: Secondary | ICD-10-CM | POA: Diagnosis not present

## 2015-03-12 MED ORDER — OMALIZUMAB 150 MG ~~LOC~~ SOLR
150.0000 mg | Freq: Once | SUBCUTANEOUS | Status: AC
  Administered 2015-03-12: 150 mg via SUBCUTANEOUS

## 2015-03-13 ENCOUNTER — Encounter: Payer: Self-pay | Admitting: Internal Medicine

## 2015-03-25 ENCOUNTER — Telehealth: Payer: Self-pay | Admitting: Internal Medicine

## 2015-03-25 NOTE — Telephone Encounter (Signed)
#   vials:1 Ordered date:03/25/15 Shipping Date:03/26/15 

## 2015-03-26 NOTE — Telephone Encounter (Signed)
#   Vials:1 Arrival Date:03/26/15 Lot #:[id number] Exp Date:2/20  

## 2015-04-09 ENCOUNTER — Ambulatory Visit (INDEPENDENT_AMBULATORY_CARE_PROVIDER_SITE_OTHER): Payer: Medicare Other

## 2015-04-09 DIAGNOSIS — J4520 Mild intermittent asthma, uncomplicated: Secondary | ICD-10-CM

## 2015-04-09 MED ORDER — OMALIZUMAB 150 MG ~~LOC~~ SOLR
150.0000 mg | Freq: Once | SUBCUTANEOUS | Status: AC
  Administered 2015-04-09: 150 mg via SUBCUTANEOUS

## 2015-05-07 ENCOUNTER — Ambulatory Visit (INDEPENDENT_AMBULATORY_CARE_PROVIDER_SITE_OTHER): Payer: Medicare Other

## 2015-05-07 DIAGNOSIS — J4520 Mild intermittent asthma, uncomplicated: Secondary | ICD-10-CM | POA: Diagnosis not present

## 2015-05-07 MED ORDER — OMALIZUMAB 150 MG ~~LOC~~ SOLR
150.0000 mg | Freq: Once | SUBCUTANEOUS | Status: AC
  Administered 2015-05-07: 150 mg via SUBCUTANEOUS

## 2015-05-12 ENCOUNTER — Telehealth: Payer: Self-pay | Admitting: Internal Medicine

## 2015-05-12 MED ORDER — CLARITHROMYCIN 500 MG PO TABS: 500.0000 mg | ORAL_TABLET | Freq: Two times a day (BID) | ORAL | Status: DC

## 2015-05-12 MED ORDER — PREDNISONE 10 MG PO TABS
ORAL_TABLET | ORAL | Status: DC
Start: 2015-05-12 — End: 2015-08-03

## 2015-05-12 NOTE — Telephone Encounter (Signed)
Ok Rx Biaxin 500 mg, # 14, 1 twice daily with food            Prednisone 10 mg, # 20,   4 X 2 DAYS, 3 X 2 DAYS, 2 X 2 DAYS, 1 X 2 DAYS

## 2015-05-12 NOTE — Telephone Encounter (Signed)
Called spoke with pt. She is requesting an ABX Biaxin and prednisone to have on hand. Denies any symptoms currently. Please advise Dr. Maple Hudson thanks  Allergies  Allergen Reactions  . Azithromycin   . Cefaclor   . Iodine   . Levofloxacin   . Montelukast Sodium   . Penicillins   . Sulfonamide Derivatives      Current Outpatient Prescriptions on File Prior to Visit  Medication Sig Dispense Refill  . albuterol (PROAIR HFA) 108 (90 BASE) MCG/ACT inhaler Inhale 2 puffs into the lungs every 6 (six) hours as needed for wheezing. 3 Inhaler 3  . albuterol (PROVENTIL) (2.5 MG/3ML) 0.083% nebulizer solution Take 3 mLs (2.5 mg total) by nebulization 4 (four) times daily as needed for wheezing. 120 vial 12  . calcium gluconate 500 MG tablet Take 500 mg by mouth 2 (two) times daily.      . cholecalciferol (VITAMIN D) 1000 UNITS tablet Take 1,000 Units by mouth daily.    . clopidogrel (PLAVIX) 75 MG tablet Take 75 mg by mouth daily.      . Coenzyme Q10 (COQ10) 100 MG CAPS Take 1 capsule by mouth daily.    Marland Kitchen dexlansoprazole (DEXILANT) 60 MG capsule Take 60 mg by mouth daily.      Marland Kitchen EPIPEN 2-PAK 0.3 MG/0.3ML SOAJ injection INJECT INTO THIGH FOR SEVERE ALLERGIC REACTION 1 Device 6  . fish oil-omega-3 fatty acids 1000 MG capsule 4 by mouth daily     . fluticasone (FLONASE) 50 MCG/ACT nasal spray Place 2 sprays into both nostrils daily. 3 g 3  . Fluticasone-Salmeterol (ADVAIR DISKUS) 100-50 MCG/DOSE AEPB Inhale 1 puff and rinse mouth, twice daily 3 each 3  . loratadine (CLARITIN) 10 MG tablet Take 1 tablet (10 mg total) by mouth daily. 90 tablet 3  . losartan (COZAAR) 50 MG tablet Take 50 mg by mouth daily.      Marland Kitchen pyridoxine (B-6) 100 MG tablet Take 100 mg by mouth daily.      . rosuvastatin (CRESTOR) 5 MG tablet Take 1/2 tablet every other day and 1 tablet every other day    . vitamin B-12 (CYANOCOBALAMIN) 1000 MCG tablet Take 1,000 mcg by mouth daily.     No current facility-administered medications on  file prior to visit.

## 2015-05-12 NOTE — Telephone Encounter (Signed)
Called and spoke to pt. Informed her of the medications CY is prescribing to keep on hand. Both rx's sent to preferred pharmacy. Pt verbalized understanding and denied any further questions or concerns at this time.

## 2015-05-20 ENCOUNTER — Telehealth: Payer: Self-pay | Admitting: Internal Medicine

## 2015-05-21 NOTE — Telephone Encounter (Signed)
#   vials:1 Ordered date:05/20/15 Shipping Date:05/21/15

## 2015-05-21 NOTE — Telephone Encounter (Signed)
#   Vials:1 Arrival Date:05/21/15 Lot #:[id number] Exp Date:5/20

## 2015-05-25 ENCOUNTER — Telehealth: Payer: Self-pay | Admitting: Internal Medicine

## 2015-05-25 NOTE — Telephone Encounter (Signed)
Called and spoke with pt Pt was concerned about getting her flu shot tomorrow with her PCP and then receive xolair on 06/04/15. Pt wants to make sure there will be no interaction and wanted Dr Roxy Cedar rec  Dr Maple Hudson, are you ok with pt receiving both shots?  Allergies  Allergen Reactions  . Azithromycin   . Cefaclor   . Iodine   . Levofloxacin   . Montelukast Sodium   . Penicillins   . Sulfonamide Derivatives     Current Outpatient Prescriptions on File Prior to Visit  Medication Sig Dispense Refill  . albuterol (PROAIR HFA) 108 (90 BASE) MCG/ACT inhaler Inhale 2 puffs into the lungs every 6 (six) hours as needed for wheezing. 3 Inhaler 3  . albuterol (PROVENTIL) (2.5 MG/3ML) 0.083% nebulizer solution Take 3 mLs (2.5 mg total) by nebulization 4 (four) times daily as needed for wheezing. 120 vial 12  . calcium gluconate 500 MG tablet Take 500 mg by mouth 2 (two) times daily.      . cholecalciferol (VITAMIN D) 1000 UNITS tablet Take 1,000 Units by mouth daily.    . clarithromycin (BIAXIN) 500 MG tablet Take 1 tablet (500 mg total) by mouth 2 (two) times daily. 14 tablet 0  . clopidogrel (PLAVIX) 75 MG tablet Take 75 mg by mouth daily.      . Coenzyme Q10 (COQ10) 100 MG CAPS Take 1 capsule by mouth daily.    Marland Kitchen dexlansoprazole (DEXILANT) 60 MG capsule Take 60 mg by mouth daily.      Marland Kitchen EPIPEN 2-PAK 0.3 MG/0.3ML SOAJ injection INJECT INTO THIGH FOR SEVERE ALLERGIC REACTION 1 Device 6  . fish oil-omega-3 fatty acids 1000 MG capsule 4 by mouth daily     . fluticasone (FLONASE) 50 MCG/ACT nasal spray Place 2 sprays into both nostrils daily. 3 g 3  . Fluticasone-Salmeterol (ADVAIR DISKUS) 100-50 MCG/DOSE AEPB Inhale 1 puff and rinse mouth, twice daily 3 each 3  . loratadine (CLARITIN) 10 MG tablet Take 1 tablet (10 mg total) by mouth daily. 90 tablet 3  . losartan (COZAAR) 50 MG tablet Take 50 mg by mouth daily.      . predniSONE (DELTASONE) 10 MG tablet Take 4 tab for 2 days, 3 tab for 2 days,  2 tab for 2 tabs, 1 tab for 2 days and then stop 20 tablet 0  . pyridoxine (B-6) 100 MG tablet Take 100 mg by mouth daily.      . rosuvastatin (CRESTOR) 5 MG tablet Take 1/2 tablet every other day and 1 tablet every other day    . vitamin B-12 (CYANOCOBALAMIN) 1000 MCG tablet Take 1,000 mcg by mouth daily.     No current facility-administered medications on file prior to visit.

## 2015-05-25 NOTE — Telephone Encounter (Signed)
Pt aware of recs.  Nothing further needed. 

## 2015-05-25 NOTE — Telephone Encounter (Signed)
No problem with both shots

## 2015-06-04 ENCOUNTER — Ambulatory Visit (INDEPENDENT_AMBULATORY_CARE_PROVIDER_SITE_OTHER): Payer: Medicare Other

## 2015-06-04 DIAGNOSIS — J4540 Moderate persistent asthma, uncomplicated: Secondary | ICD-10-CM | POA: Diagnosis not present

## 2015-06-05 MED ORDER — OMALIZUMAB 150 MG ~~LOC~~ SOLR
150.0000 mg | Freq: Once | SUBCUTANEOUS | Status: AC
  Administered 2015-06-04: 150 mg via SUBCUTANEOUS

## 2015-06-17 ENCOUNTER — Telehealth: Payer: Self-pay

## 2015-06-17 NOTE — Telephone Encounter (Signed)
Xolair Vials: 1 Lot #: [id number] Exp date: 03/2017

## 2015-07-01 ENCOUNTER — Ambulatory Visit: Payer: Medicare Other

## 2015-07-03 ENCOUNTER — Ambulatory Visit (INDEPENDENT_AMBULATORY_CARE_PROVIDER_SITE_OTHER): Payer: Medicare Other

## 2015-07-03 DIAGNOSIS — J4540 Moderate persistent asthma, uncomplicated: Secondary | ICD-10-CM

## 2015-07-03 MED ORDER — OMALIZUMAB 150 MG ~~LOC~~ SOLR
150.0000 mg | Freq: Once | SUBCUTANEOUS | Status: AC
  Administered 2015-07-03: 150 mg via SUBCUTANEOUS

## 2015-07-06 DIAGNOSIS — I10 Essential (primary) hypertension: Secondary | ICD-10-CM

## 2015-07-06 DIAGNOSIS — E785 Hyperlipidemia, unspecified: Secondary | ICD-10-CM

## 2015-07-06 DIAGNOSIS — R002 Palpitations: Secondary | ICD-10-CM | POA: Insufficient documentation

## 2015-07-06 DIAGNOSIS — I340 Nonrheumatic mitral (valve) insufficiency: Secondary | ICD-10-CM

## 2015-07-06 DIAGNOSIS — I6521 Occlusion and stenosis of right carotid artery: Secondary | ICD-10-CM

## 2015-07-06 DIAGNOSIS — I491 Atrial premature depolarization: Secondary | ICD-10-CM

## 2015-07-06 HISTORY — DX: Essential (primary) hypertension: I10

## 2015-07-06 HISTORY — DX: Nonrheumatic mitral (valve) insufficiency: I34.0

## 2015-07-06 HISTORY — DX: Palpitations: R00.2

## 2015-07-06 HISTORY — DX: Occlusion and stenosis of right carotid artery: I65.21

## 2015-07-06 HISTORY — DX: Atrial premature depolarization: I49.1

## 2015-07-06 HISTORY — DX: Hyperlipidemia, unspecified: E78.5

## 2015-07-13 ENCOUNTER — Telehealth: Payer: Self-pay | Admitting: Internal Medicine

## 2015-07-13 NOTE — Telephone Encounter (Signed)
#   Vials:1 Arrival Date: 07/13/15 Lot #:[id number] Exp Date:3/19

## 2015-07-31 ENCOUNTER — Ambulatory Visit: Payer: Medicare Other

## 2015-07-31 ENCOUNTER — Telehealth: Payer: Self-pay | Admitting: Internal Medicine

## 2015-07-31 MED ORDER — DOXYCYCLINE HYCLATE 100 MG PO TABS: ORAL_TABLET | ORAL | Status: DC

## 2015-07-31 NOTE — Telephone Encounter (Signed)
Ok to send Rx doxycycline 100 mg, # 8, 2 today then one daily  She can hold this and use it if necessary over the weekend. What she has is probably viral, in which case antibiotics won't help.  Ok to use mucinex-DM and extra hydration

## 2015-07-31 NOTE — Telephone Encounter (Signed)
lmtcb X1 for pt  

## 2015-07-31 NOTE — Telephone Encounter (Signed)
[phone] calling back

## 2015-07-31 NOTE — Telephone Encounter (Signed)
Called and spoke with pt and informed of CY rec and medication instructions Pt voiced understanding Pharmacy verified with pt and order sent electronically to pharmacy   Nothing further is needed at this time.

## 2015-07-31 NOTE — Telephone Encounter (Signed)
Spoke with Debra Byrd, c/o sinus congestion, PND, prod cough with thick white mucus, sore throat, "scratchy lungs" since yesterday.    Denies fever.  Has been taking claritin to help with s/s. Debra Byrd has xolair injection appt at 10:00 today-is unsure if she needs to reschedule. Spoke with CY re: this, states if she is afebrile Debra Byrd can keep appt if she feels comfortable with this.  Relayed this to Debra Byrd, she requested to move appt injection to Monday.  This has been done.    Debra Byrd requesting further recs with her s/s.    Debra Byrd uses [facility] in [place].   Please call Debra Byrd's cell phone with recs.    CY please advise.  Thanks!  Allergies  Allergen Reactions  . Azithromycin   . Cefaclor   . Iodine   . Levofloxacin   . Montelukast Sodium   . Penicillins   . Sulfonamide Derivatives    Current Outpatient Prescriptions on File Prior to Visit  Medication Sig Dispense Refill  . albuterol (PROAIR HFA) 108 (90 BASE) MCG/ACT inhaler Inhale 2 puffs into the lungs every 6 (six) hours as needed for wheezing. 3 Inhaler 3  . albuterol (PROVENTIL) (2.5 MG/3ML) 0.083% nebulizer solution Take 3 mLs (2.5 mg total) by nebulization 4 (four) times daily as needed for wheezing. 120 vial 12  . calcium gluconate 500 MG tablet Take 500 mg by mouth 2 (two) times daily.      . cholecalciferol (VITAMIN D) 1000 UNITS tablet Take 1,000 Units by mouth daily.    . clarithromycin (BIAXIN) 500 MG tablet Take 1 tablet (500 mg total) by mouth 2 (two) times daily. 14 tablet 0  . clopidogrel (PLAVIX) 75 MG tablet Take 75 mg by mouth daily.      . Coenzyme Q10 (COQ10) 100 MG CAPS Take 1 capsule by mouth daily.    Marland Kitchen. dexlansoprazole (DEXILANT) 60 MG capsule Take 60 mg by mouth daily.      Marland Kitchen. EPIPEN 2-PAK 0.3 MG/0.3ML SOAJ injection INJECT INTO THIGH FOR SEVERE ALLERGIC REACTION 1 Device 6  . fish oil-omega-3 fatty acids 1000 MG capsule 4 by mouth daily     . fluticasone (FLONASE) 50 MCG/ACT nasal spray Place 2 sprays into both nostrils daily. 3 g 3  .  Fluticasone-Salmeterol (ADVAIR DISKUS) 100-50 MCG/DOSE AEPB Inhale 1 puff and rinse mouth, twice daily 3 each 3  . loratadine (CLARITIN) 10 MG tablet Take 1 tablet (10 mg total) by mouth daily. 90 tablet 3  . losartan (COZAAR) 50 MG tablet Take 50 mg by mouth daily.      . predniSONE (DELTASONE) 10 MG tablet Take 4 tab for 2 days, 3 tab for 2 days, 2 tab for 2 tabs, 1 tab for 2 days and then stop 20 tablet 0  . pyridoxine (B-6) 100 MG tablet Take 100 mg by mouth daily.      . rosuvastatin (CRESTOR) 5 MG tablet Take 1/2 tablet every other day and 1 tablet every other day    . vitamin B-12 (CYANOCOBALAMIN) 1000 MCG tablet Take 1,000 mcg by mouth daily.     No current facility-administered medications on file prior to visit.

## 2015-08-03 ENCOUNTER — Ambulatory Visit: Payer: Medicare Other

## 2015-08-03 ENCOUNTER — Encounter: Payer: Self-pay | Admitting: Internal Medicine

## 2015-08-03 ENCOUNTER — Ambulatory Visit: Payer: Medicare Other | Admitting: Internal Medicine

## 2015-08-03 VITALS — BP 122/74 | HR 72 | Ht 61.0 in | Wt 171.6 lb

## 2015-08-03 DIAGNOSIS — J209 Acute bronchitis, unspecified: Secondary | ICD-10-CM

## 2015-08-03 DIAGNOSIS — J4540 Moderate persistent asthma, uncomplicated: Secondary | ICD-10-CM

## 2015-08-03 MED ORDER — OMALIZUMAB 150 MG ~~LOC~~ SOLR
150.0000 mg | Freq: Once | SUBCUTANEOUS | Status: AC
  Administered 2015-08-03: 150 mg via SUBCUTANEOUS

## 2015-08-03 MED ORDER — METHYLPREDNISOLONE ACETATE 80 MG/ML IJ SUSP
80.0000 mg | Freq: Once | INTRAMUSCULAR | Status: AC
  Administered 2015-08-03: 80 mg via INTRAMUSCULAR

## 2015-08-03 NOTE — Patient Instructions (Signed)
Depo 80  Finish the doxycycline. You can call us then if we need to extend the course.  Ok to get [employer]olair today  Stay hydrated, avoid chilling and getting exhausted

## 2015-08-03 NOTE — Progress Notes (Signed)
Subjective:    Patient ID: Debra CraftsLynna L Byrd, female    DOB: 04/20/1946, 69 y.o.   MRN: [id number]  HPI [age] yoF never smoker followed for hx of chronic obstructive asthma which has often been steroid dependent, complicated by allergic rhinitis, suspected vocal cord dysfunction syndrome. Last here- October 20/2011 for preop assessment. She ended up deferring shoulder surgeries because of Orthopedist's cautions due to her asthma. [facility] and has improved with Physical Therapy, so surgery is not planned now.  She started herself on antibiotic and prednisone in February for sinusitis and f/u w/ Dr Benedetto GoadShevlin- resolved. March 17 had difficulty getting deep breath. Went to [facility]- had CXR,  Leg Dopplers, Stress test. Finally dx'd "pleurisy". A neb helped there, suggesting some mild asthma exacerbation w/o overt bronchospasm. Uses rescue inhaler 1-3x/ week. She used it today walking from parking lot.   Continues Xolair injections, credited for significant improvement. Diagnosed with meningioma.  06/28/11- 64 yoF never smoker followed for hx of chronic obstructive asthma which has often been steroid dependent, complicated by allergic rhinitis, suspected vocal cord dysfunction syndrome.  She continues Xolair injections and her Advair 100. At times she thinks she needs stronger Advair and we discussed this. She has definitely felt more stable in the last few years on Xolair. No prednisone since early summer when Dr.Shevlin gave it for allergic conjunctivitis. She did did have an episode of sinusitis early in the fall treated at an urgent care.  12/27/11-  64 yoF never smoker followed for hx of chronic obstructive asthma which has often been steroid dependent, complicated by allergic rhinitis, suspected vocal cord dysfunction syndrome.  Denies any wheezing,SOB, cough, or congestion at this time; still on Xolair and doing well. Had some increased age and cough during pollen season. Continues  Xolair therapy and Claritin with Flonase are sufficient. She stayed with Advair 100 and rarely needs her rescue inhaler. Occasional mild wheeze which she feels in her throat. PFT 01/28/2010 was reviewed with her-mild obstructive airways disease with response to bronchodilator. FEV1/FVC 0.72.  07/03/12- 66 yoF never smoker followed for hx of chronic obstructive asthma which has often been steroid dependent, complicated by allergic rhinitis, peripheral neuropathy, glaucoma, OSA  Continues Xolair once per month. Continues Advair 100. Had sinusitis in October treated with doxycycline. Feels breathing is well controlled with only occasional wheeze. Minor scratchy throat today. She wants Biaxin and prednisone to hold, emphasizing that she tolerates Biaxin well. Has noticed soreness upper anterior ribs with cough or deep breath, nonfocal.  01/07/13- 66 yoF never smoker followed for hx of chronic obstructive asthma which has often been steroid dependent, complicated by allergic rhinitis, peripheral neuropathy, glaucoma, OSA FOLLOWS FOR: has 1 day left of abx for asthma flare up; continues Xolair injections and doing well with them. Resolving acute bronchitis, now on prednisone and Biaxin. She wants prescriptions to hold for these which we discussed.-  07/10/13-67 yoF never smoker followed for hx of chronic obstructive asthma/ Xolair which has often been steroid dependent, complicated by allergic rhinitis, peripheral neuropathy, glaucoma, OSA FOLLOWS FOR:  Breathing is unchanged since last OV.  Doing well on Xolair Has been more sleepy. Her primary physician asked about an update of her sleep study. Bedtime around 10 PM with short sleep latency. Wakes around 2 AM for bathroom. Husband wears CPAP and has restless sleep bothers her. She wakes tired about 6 or 7 AM. No naps. Little caffeine. Some lightheaded/dizzy NPSG 11/01/04 AHI 6.4/ hr. She  has never used CPAP. Now on Biaxin for sinus infection. No wheezing.  Likes Xolair.  09/09/13- 67 yoF never smoker followed for hx of chronic obstructive asthma/ Xolair which has often been steroid dependent, complicated by allergic rhinitis, peripheral neuropathy, glaucoma, OSA FOLLOWS FOR:  c/o:  sinus infection that has turned in to bronchitis.  Still having cough and mucus--thick and white in color.  Finished round of antibiotcs, still taking pred  daily  01/06/14-  67 yoF never smoker followed for hx of chronic obstructive asthma/ Xolair which has often been steroid dependent, complicated by allergic rhinitis, peripheral neuropathy, glaucoma, OSA FOLLOWS FOR: Had a rough winter; doing well with Xolair injections every 4 weeks. Cardiologist told her okay to use full strength EpiPen if she ever needed to use it. Going to visit [place]discussed travel and altitude.  07/09/14-67 yoF never smoker followed for hx of chronic obstructive asthma/ Xolair which has often been steroid dependent, complicated by allergic rhinitis, peripheral neuropathy, glaucoma, OSA FOLLOWS FOR: still on Xolair and doing well. Sharion Settler big help. Feels well. Baseline nasal drainage. ENT eval for hoarseness. Having to raise voice to speak to deaf mother. Discussed possible role of Advair. Can change to [facility] and use aerochamber to see if that helps.  08/03/2015-69 year old female never smoker followed for history chronic obstructive asthma/Xolair to soften been steroid dependent, complicated by allergic rhinitis, peripheral neuropathy, glaucoma, OSA ACUTE VISIT: Called Friday last week and was given Doxycycline-since taking this-the sinus pressure has decreased; however the cough continues and getting worse; productive at times-yellow in color. No fever or chills. Had skipped Xolair last week was not feeling well.     ROS-see HPI- Constitutional:   No-   weight loss, night sweats, fevers, chills, +fatigue, lassitude. HEENT:   No-   difficulty swallowing, tooth/dental problems, sore  throat,       No-  sneezing, itching, ear ache, nasal congestion, +post nasal drip,  CV:  + Noncardiac chest pain, no- orthopnea, PND, swelling in lower extremities,                                            anasarca, dizziness, palpitations Resp:+shortness of breath with exertion or at rest.              + productive cough,  + non-productive cough,  No- coughing up of blood.              No-   change in color of mucus.  +Little wheezing.   Skin: No-   rash or lesions. GI:  No-   heartburn, indigestion, abdominal pain, nausea, vomiting,  GU: pain. MS:  No-   joint pain or swelling.   Neuro-     nothing unusualk pain. Psych:  No- change in mood or affect. No depression or anxiety.  No memory loss.   Objective:   Physical Exam General- Alert, Oriented, Affect-appropriate, Distress- none acute; overweight, NAD Skin- rash-none, lesions- none, excoriation- none Lymphadenopathy- none Head- atraumatic            Eyes- Gross vision intact, PERRLA, conjunctivae clear secretions. Chronic periorbital edema.            Ears- Hearing, canals-normal            Nose- Clear, no-Septal dev, mucus, polyps, erosion, perforation  Throat- Mallampati II-III , mucosa clear , drainage- none, tonsils- atrophic Neck- flexible , trachea midline, no stridor , thyroid nl, carotid no bruit Chest - symmetrical excursion , unlabored           Heart/CV- RRR , no murmur , no gallop  , no rub, nl s1 s2                           - JVD- none , edema- none, stasis changes- none, varices- none           Lung- clear, unlabored, dullness-none, rub- none           Chest wall-  Abd-  Br/ Gen/ Rectal- Not done, not indicated Extrem- cyanosis- none, clubbing, none, atrophy- none, strength- nl Neuro- grossly intact to observation

## 2015-08-05 ENCOUNTER — Telehealth: Payer: Self-pay | Admitting: Internal Medicine

## 2015-08-05 MED ORDER — PREDNISONE 10 MG PO TABS: ORAL_TABLET | ORAL | Status: DC

## 2015-08-05 MED ORDER — DOXYCYCLINE HYCLATE 100 MG PO TABS: 100.0000 mg | ORAL_TABLET | Freq: Every day | ORAL | Status: DC

## 2015-08-05 MED ORDER — PROMETHAZINE-CODEINE 6.25-10 MG/5ML PO SYRP: 5.0000 mL | ORAL_SOLUTION | Freq: Four times a day (QID) | ORAL | Status: DC | PRN

## 2015-08-05 NOTE — Telephone Encounter (Signed)
Spoke with pt. Reports that she is not any better since seeing CY on 08/03/15. Cough, SOB and wheezing are worse. She has coughed so much she has lost her voice. Would like her antibiotic to be extended, prednisone sent in and something to help with her cough. CY - please advise.  Allergies  Allergen Reactions  . Azithromycin   . Cefaclor   . Iodine   . Levofloxacin   . Montelukast Sodium   . Penicillins   . Sulfonamide Derivatives    Current Outpatient Prescriptions on File Prior to Visit  Medication Sig Dispense Refill  . albuterol (PROAIR HFA) 108 (90 BASE) MCG/ACT inhaler Inhale 2 puffs into the lungs every 6 (six) hours as needed for wheezing. 3 Inhaler 3  . albuterol (PROVENTIL) (2.5 MG/3ML) 0.083% nebulizer solution Take 3 mLs (2.5 mg total) by nebulization 4 (four) times daily as needed for wheezing. 120 vial 12  . calcium gluconate 500 MG tablet Take 500 mg by mouth 2 (two) times daily.      . cholecalciferol (VITAMIN D) 1000 UNITS tablet Take 1,000 Units by mouth daily.    . clopidogrel (PLAVIX) 75 MG tablet Take 75 mg by mouth daily.      . Coenzyme Q10 (COQ10) 100 MG CAPS Take 1 capsule by mouth daily.    Marland Kitchen. dexlansoprazole (DEXILANT) 60 MG capsule Take 60 mg by mouth daily.      Marland Kitchen. doxycycline (VIBRA-TABS) 100 MG tablet Take 2 tablets by mouth on first day and then 1 tablet by mouth every day until gone 8 tablet 0  . EPIPEN 2-PAK 0.3 MG/0.3ML SOAJ injection INJECT INTO THIGH FOR SEVERE ALLERGIC REACTION 1 Device 6  . fish oil-omega-3 fatty acids 1000 MG capsule 4 by mouth daily     . fluticasone (FLONASE) 50 MCG/ACT nasal spray Place 2 sprays into both nostrils daily. 3 g 3  . Fluticasone-Salmeterol (ADVAIR DISKUS) 100-50 MCG/DOSE AEPB Inhale 1 puff and rinse mouth, twice daily 3 each 3  . loratadine (CLARITIN) 10 MG tablet Take 1 tablet (10 mg total) by mouth daily. 90 tablet 3  . losartan (COZAAR) 50 MG tablet Take 50 mg by mouth daily.      Marland Kitchen. pyridoxine (B-6) 100 MG  tablet Take 100 mg by mouth daily.      . rosuvastatin (CRESTOR) 5 MG tablet Take 1/2 tablet every other day and 1 tablet every other day    . vitamin B-12 (CYANOCOBALAMIN) 1000 MCG tablet Take 1,000 mcg by mouth daily.     No current facility-administered medications on file prior to visit.

## 2015-08-05 NOTE — Telephone Encounter (Signed)
Offer doxycycline 100 mg, # 7, 1 daily            Prednisone 10 mg, # 20, 4 X 2 DAYS, 3 X 2 DAYS, 2 X 2 DAYS, 1 X 2 DAYS]            Prometh codeine 150 ml.     5 ml every 6 hours as needed for cough

## 2015-08-05 NOTE — Telephone Encounter (Signed)
Pt aware of Rx's below that have been called into pharmacy. Nothing further needed.

## 2015-08-06 ENCOUNTER — Telehealth: Payer: Self-pay | Admitting: Internal Medicine

## 2015-08-06 NOTE — Telephone Encounter (Signed)
#   vials:1 Ordered date:08/06/15 Shipping Date:08/06/15

## 2015-08-07 NOTE — Telephone Encounter (Signed)
#   Vials:1 Arrival Date:08/07/15 Lot #:[id number] Exp Date:6/20  

## 2015-08-31 ENCOUNTER — Ambulatory Visit: Payer: Medicare Other

## 2015-09-01 ENCOUNTER — Ambulatory Visit: Payer: Medicare Other | Admitting: Internal Medicine

## 2015-09-02 ENCOUNTER — Ambulatory Visit (INDEPENDENT_AMBULATORY_CARE_PROVIDER_SITE_OTHER): Payer: Medicare Other

## 2015-09-02 ENCOUNTER — Ambulatory Visit: Payer: Medicare Other

## 2015-09-02 DIAGNOSIS — J4540 Moderate persistent asthma, uncomplicated: Secondary | ICD-10-CM

## 2015-09-02 MED ORDER — OMALIZUMAB 150 MG ~~LOC~~ SOLR
150.0000 mg | Freq: Once | SUBCUTANEOUS | Status: AC
  Administered 2015-09-02: 150 mg via SUBCUTANEOUS

## 2015-09-16 ENCOUNTER — Telehealth: Payer: Self-pay | Admitting: Internal Medicine

## 2015-09-16 NOTE — Telephone Encounter (Signed)
#   vials:1 Ordered date:07/26/16 Shipping Date:07/27/16 

## 2015-09-17 NOTE — Telephone Encounter (Signed)
#   Vials:1 Arrival Date:09/17/15 Lot #:[id number] Exp Date:6/20  

## 2015-09-30 ENCOUNTER — Ambulatory Visit (INDEPENDENT_AMBULATORY_CARE_PROVIDER_SITE_OTHER): Payer: Medicare Other

## 2015-09-30 DIAGNOSIS — J4540 Moderate persistent asthma, uncomplicated: Secondary | ICD-10-CM

## 2015-10-01 MED ORDER — OMALIZUMAB 150 MG ~~LOC~~ SOLR
150.0000 mg | Freq: Once | SUBCUTANEOUS | Status: AC
  Administered 2015-09-30: 150 mg via SUBCUTANEOUS

## 2015-10-11 DIAGNOSIS — J309 Allergic rhinitis, unspecified: Secondary | ICD-10-CM

## 2015-10-11 DIAGNOSIS — R0982 Postnasal drip: Secondary | ICD-10-CM

## 2015-10-11 HISTORY — DX: Allergic rhinitis, unspecified: J30.9

## 2015-10-13 ENCOUNTER — Telehealth: Payer: Self-pay | Admitting: Internal Medicine

## 2015-10-13 NOTE — Telephone Encounter (Signed)
#   Vials:1 Arrival Date:10/13/15 Lot #:[id number] Exp Date:9/20

## 2015-10-13 NOTE — Telephone Encounter (Signed)
#   vials:1 Ordered date:10/12/15 Shipping Date:10/13/15 Totally forgot to put this in.

## 2015-10-28 ENCOUNTER — Ambulatory Visit (INDEPENDENT_AMBULATORY_CARE_PROVIDER_SITE_OTHER): Payer: Medicare Other

## 2015-10-28 ENCOUNTER — Ambulatory Visit: Payer: Medicare Other

## 2015-10-28 DIAGNOSIS — J4540 Moderate persistent asthma, uncomplicated: Secondary | ICD-10-CM

## 2015-10-28 MED ORDER — OMALIZUMAB 150 MG ~~LOC~~ SOLR
150.0000 mg | Freq: Once | SUBCUTANEOUS | Status: AC
  Administered 2015-10-28: 150 mg via SUBCUTANEOUS

## 2015-11-12 ENCOUNTER — Telehealth: Payer: Self-pay | Admitting: Internal Medicine

## 2015-11-12 NOTE — Telephone Encounter (Signed)
#   vials:1  Ordered date:11/12/15 Shipping Date:3/24 or 11/16/15   

## 2015-11-13 NOTE — Telephone Encounter (Signed)
#   Vials:1 Arrival Date:11/13/2015  Lot #:[id number] Exp Date:06/2019 

## 2015-11-25 ENCOUNTER — Ambulatory Visit (INDEPENDENT_AMBULATORY_CARE_PROVIDER_SITE_OTHER): Payer: Medicare Other

## 2015-11-25 DIAGNOSIS — J4540 Moderate persistent asthma, uncomplicated: Secondary | ICD-10-CM | POA: Diagnosis not present

## 2015-11-25 MED ORDER — OMALIZUMAB 150 MG ~~LOC~~ SOLR
150.0000 mg | Freq: Once | SUBCUTANEOUS | Status: AC
  Administered 2015-11-25: 150 mg via SUBCUTANEOUS

## 2015-12-10 ENCOUNTER — Telehealth: Payer: Self-pay | Admitting: Internal Medicine

## 2015-12-11 NOTE — Telephone Encounter (Signed)
#   vials:1 Ordered date:12/10/15 ordered at 5:30 Shipping Date:12/11/15 or 12/14/15

## 2015-12-14 NOTE — Telephone Encounter (Signed)
#   Vials:1 Arrival Date: 12/14/15 Lot #[id number] Exp Date:11/20

## 2015-12-16 ENCOUNTER — Telehealth: Payer: Self-pay | Admitting: Internal Medicine

## 2015-12-16 NOTE — Telephone Encounter (Signed)
LVM for pt to return call

## 2015-12-17 MED ORDER — PREDNISONE 10 MG PO TABS: ORAL_TABLET | ORAL | Status: DC

## 2015-12-17 MED ORDER — ALBUTEROL SULFATE HFA 108 (90 BASE) MCG/ACT IN AERS: 2.0000 | INHALATION_SPRAY | Freq: Four times a day (QID) | RESPIRATORY_TRACT | Status: DC | PRN

## 2015-12-17 MED ORDER — FLUTICASONE-SALMETEROL 100-50 MCG/DOSE IN AEPB: INHALATION_SPRAY | RESPIRATORY_TRACT | Status: DC

## 2015-12-17 MED ORDER — LORATADINE 10 MG PO TABS: 10.0000 mg | ORAL_TABLET | Freq: Every day | ORAL | Status: DC

## 2015-12-17 MED ORDER — EPINEPHRINE 0.3 MG/0.3ML IJ SOAJ: 0.3000 mg | Freq: Once | INTRAMUSCULAR | Status: DC

## 2015-12-17 MED ORDER — CLARITHROMYCIN 500 MG PO TABS: 500.0000 mg | ORAL_TABLET | Freq: Two times a day (BID) | ORAL | Status: DC

## 2015-12-17 MED ORDER — FLUTICASONE PROPIONATE 50 MCG/ACT NA SUSP: 2.0000 | Freq: Every day | NASAL | Status: DC

## 2015-12-17 NOTE — Telephone Encounter (Signed)
Pt returning call.Debra Byrd ° °

## 2015-12-17 NOTE — Telephone Encounter (Signed)
Ok to refill all meds as requested  Biaxin 500 mg, # 14,    1 twice daily, refill x 1  Prednisone 10 mg, # 20    4 X 2 DAYS, 3 X 2 DAYS, 2 X 2 DAYS, 1 X 2 DAYS

## 2015-12-17 NOTE — Telephone Encounter (Signed)
Spoke with pt and advised of refills. Printed and given to CY to sign. Pt will come by tomorrow to pick up.

## 2015-12-17 NOTE — Telephone Encounter (Signed)
Pt is requesting 90D refill rx's for Advair, Claritin, Flonase, ProAir and refill EpiPens. Pt is also asking for a rx for Biaxin and prednisone taper to have on hand for if she develops a sinus infection.  CY please advise.  Allergies  Allergen Reactions  . Azithromycin   . Cefaclor   . Iodine   . Levofloxacin   . Montelukast Sodium   . Penicillins   . Sulfonamide Derivatives    Current Outpatient Prescriptions on File Prior to Visit  Medication Sig Dispense Refill  . albuterol (PROAIR HFA) 108 (90 BASE) MCG/ACT inhaler Inhale 2 puffs into the lungs every 6 (six) hours as needed for wheezing. 3 Inhaler 3  . albuterol (PROVENTIL) (2.5 MG/3ML) 0.083% nebulizer solution Take 3 mLs (2.5 mg total) by nebulization 4 (four) times daily as needed for wheezing. 120 vial 12  . calcium gluconate 500 MG tablet Take 500 mg by mouth 2 (two) times daily.      . cholecalciferol (VITAMIN D) 1000 UNITS tablet Take 1,000 Units by mouth daily.    . clopidogrel (PLAVIX) 75 MG tablet Take 75 mg by mouth daily.      . Coenzyme Q10 (COQ10) 100 MG CAPS Take 1 capsule by mouth daily.    Marland Kitchen. dexlansoprazole (DEXILANT) 60 MG capsule Take 60 mg by mouth daily.      Marland Kitchen. doxycycline (VIBRA-TABS) 100 MG tablet Take 2 tablets by mouth on first day and then 1 tablet by mouth every day until gone 8 tablet 0  . doxycycline (VIBRA-TABS) 100 MG tablet Take 1 tablet (100 mg total) by mouth daily. 7 tablet 0  . EPIPEN 2-PAK 0.3 MG/0.3ML SOAJ injection INJECT INTO THIGH FOR SEVERE ALLERGIC REACTION 1 Device 6  . fish oil-omega-3 fatty acids 1000 MG capsule 4 by mouth daily     . fluticasone (FLONASE) 50 MCG/ACT nasal spray Place 2 sprays into both nostrils daily. 3 g 3  . Fluticasone-Salmeterol (ADVAIR DISKUS) 100-50 MCG/DOSE AEPB Inhale 1 puff and rinse mouth, twice daily 3 each 3  . loratadine (CLARITIN) 10 MG tablet Take 1 tablet (10 mg total) by mouth daily. 90 tablet 3  . losartan (COZAAR) 50 MG tablet Take 50 mg by mouth  daily.      . predniSONE (DELTASONE) 10 MG tablet Take 4 tabs po x 2 days, then 3 x 2 days, then 2 x 2 days, then 1 x 2 days then stop. 20 tablet 0  . promethazine-codeine (PHENERGAN WITH CODEINE) 6.25-10 MG/5ML syrup Take 5 mLs by mouth every 6 (six) hours as needed for cough. 150 mL 0  . pyridoxine (B-6) 100 MG tablet Take 100 mg by mouth daily.      . rosuvastatin (CRESTOR) 5 MG tablet Take 1/2 tablet every other day and 1 tablet every other day    . vitamin B-12 (CYANOCOBALAMIN) 1000 MCG tablet Take 1,000 mcg by mouth daily.     No current facility-administered medications on file prior to visit.

## 2015-12-22 ENCOUNTER — Telehealth: Payer: Self-pay | Admitting: Internal Medicine

## 2015-12-22 NOTE — Telephone Encounter (Signed)
Per CY, ok to switch from epi-pen to adrena-clik.   Called and spoke with pharmacist, aware of this switch.  Nothing further needed.

## 2015-12-23 ENCOUNTER — Ambulatory Visit (INDEPENDENT_AMBULATORY_CARE_PROVIDER_SITE_OTHER): Payer: Medicare Other

## 2015-12-23 DIAGNOSIS — J4540 Moderate persistent asthma, uncomplicated: Secondary | ICD-10-CM | POA: Diagnosis not present

## 2015-12-23 MED ORDER — OMALIZUMAB 150 MG ~~LOC~~ SOLR
150.0000 mg | Freq: Once | SUBCUTANEOUS | Status: AC
  Administered 2015-12-23: 150 mg via SUBCUTANEOUS

## 2016-01-04 ENCOUNTER — Telehealth: Payer: Self-pay | Admitting: Internal Medicine

## 2016-01-04 NOTE — Telephone Encounter (Signed)
#   vials:1 Ordered date:01/01/16 Per Katie ok to wait. Shipping Date:01/05/16 

## 2016-01-05 NOTE — Telephone Encounter (Signed)
#   Vials:1 Arrival Date:01/05/16 Lot #:[id number] Exp Date:12//20  

## 2016-01-20 ENCOUNTER — Ambulatory Visit: Payer: Medicare Other

## 2016-01-27 ENCOUNTER — Ambulatory Visit (INDEPENDENT_AMBULATORY_CARE_PROVIDER_SITE_OTHER): Payer: Medicare Other

## 2016-01-27 DIAGNOSIS — J4540 Moderate persistent asthma, uncomplicated: Secondary | ICD-10-CM

## 2016-02-01 ENCOUNTER — Telehealth: Payer: Self-pay | Admitting: Internal Medicine

## 2016-02-01 ENCOUNTER — Ambulatory Visit (INDEPENDENT_AMBULATORY_CARE_PROVIDER_SITE_OTHER): Payer: Medicare Other | Admitting: Internal Medicine

## 2016-02-01 ENCOUNTER — Encounter: Payer: Self-pay | Admitting: Internal Medicine

## 2016-02-01 DIAGNOSIS — J449 Chronic obstructive pulmonary disease, unspecified: Secondary | ICD-10-CM

## 2016-02-01 DIAGNOSIS — J45909 Unspecified asthma, uncomplicated: Secondary | ICD-10-CM

## 2016-02-01 NOTE — Telephone Encounter (Signed)
#   vials:1 Ordered date:02/01/16 Shipping Date:02/02/16 

## 2016-02-01 NOTE — Progress Notes (Signed)
Subjective:    Patient ID: Debra Byrd, female    DOB: 08/25/1945, 70 y.o.   MRN: [id number]  HPI Female never smoker followed for chronic obstructive asthma/Xolair/often steroid dependent, complicated by allergic rhinitis, peripheral neuropathy, glaucoma, OSA/never CPAP  64 yoF never smoker followed for hx of chronic obstructive asthma which has often been steroid dependent, complicated by allergic rhinitis, suspected vocal cord dysfunction syndrome. Last here- October 20/2011 for preop assessment. She ended  08/03/2015-70 year old female never smoker followed for history chronic obstructive asthma/Xolair to soften been steroid dependent, complicated by allergic rhinitis, peripheral neuropathy, glaucoma, OSA ACUTE VISIT: Called Friday last week and was given Doxycycline-since taking this-the sinus pressure has decreased; however the cough continues and getting worse; productive at times-yellow in color. No fever or chills. Had skipped Xolair last week was not feeling well.  02/01/2016-70 year old female never smoker followed for chronic obstructive asthma/Xolair/often steroid dependent, complicated by allergic rhinitis, peripheral neuropathy, glaucoma, OSA/never CPAP FOLLOWS FOR: Continues on Xolair and doing well. Slight cough with congestion due to allergies; otherwise doing great. Xolair has been well-tolerated and she believes it has made a significant difference and asthma control. Took prednisone and Biaxin for a purulent bronchitis [place]. Last sleep study was negative, first was "mild" OSA.  ROS-see HPI- Constitutional:   No-   weight loss, night sweats, fevers, chills, +fatigue, lassitude. HEENT:   No-   difficulty swallowing, tooth/dental problems, sore throat,       No-  sneezing, itching, ear ache, nasal congestion, +post nasal drip,  CV:  + Noncardiac chest pain, no- orthopnea, PND, swelling in lower extremities,                             anasarca, dizziness, palpitations Resp:+shortness of breath with exertion or at rest.              + productive cough,  + non-productive cough,  No- coughing up of blood.              No-   change in color of mucus.  +Little wheezing.   Skin: No-   rash or lesions. GI:  No-   heartburn, indigestion, abdominal pain, nausea, vomiting,  GU: pain. MS:  No-   joint pain or swelling.   Neuro-     nothing unusualk pain. Psych:  No- change in mood or affect. No depression or anxiety.  No memory loss.   Objective:   Physical Exam General- Alert, Oriented, Affect-appropriate, Distress- none acute; + overweight, NAD Skin- rash-none, lesions- none, excoriation- none Lymphadenopathy- none Head- atraumatic            Eyes- Gross vision intact, PERRLA, conjunctivae clear secretions. Chronic periorbital edema.            Ears- Hearing, canals-normal            Nose- Clear, no-Septal dev, mucus, polyps, erosion, perforation             Throat- Mallampati II-III , mucosa clear , drainage- none, tonsils- atrophic Neck- flexible , trachea midline, no stridor , thyroid nl, carotid no bruit Chest - symmetrical excursion , unlabored           Heart/CV- RRR , no murmur , no gallop  , no rub, nl s1 s2                           -  JVD- none , edema- none, stasis changes- none, varices- none           Lung- clear, unlabored, dullness-none, rub- none           Chest wall-  Abd-  Br/ Gen/ Rectal- Not done, not indicated Extrem- cyanosis- none, clubbing, none, atrophy- none, strength- nl Neuro- grossly intact to observation

## 2016-02-01 NOTE — Patient Instructions (Signed)
Ok to try a beta-blocker eye drop, but be prepared to deal with asthma flare or to change back from this class of eye drops if necessary.   Please call as needed

## 2016-02-02 ENCOUNTER — Telehealth: Payer: Self-pay | Admitting: Internal Medicine

## 2016-02-02 NOTE — Telephone Encounter (Signed)
Pt is aware that both medications have been added to her medication list and nothing more needed at this time.

## 2016-02-03 DIAGNOSIS — J4540 Moderate persistent asthma, uncomplicated: Secondary | ICD-10-CM | POA: Diagnosis not present

## 2016-02-03 MED ORDER — OMALIZUMAB 150 MG ~~LOC~~ SOLR
150.0000 mg | Freq: Once | SUBCUTANEOUS | Status: AC
  Administered 2016-02-03: 150 mg via SUBCUTANEOUS

## 2016-02-03 NOTE — Telephone Encounter (Signed)
#   Vials:1 Arrival Date:02/03/16 Lot #:[id number] Exp Date:12/20  

## 2016-02-24 ENCOUNTER — Ambulatory Visit (INDEPENDENT_AMBULATORY_CARE_PROVIDER_SITE_OTHER): Payer: Medicare Other

## 2016-02-24 DIAGNOSIS — J4540 Moderate persistent asthma, uncomplicated: Secondary | ICD-10-CM

## 2016-02-25 ENCOUNTER — Ambulatory Visit: Payer: Medicare Other

## 2016-02-25 MED ORDER — OMALIZUMAB 150 MG ~~LOC~~ SOLR
150.0000 mg | Freq: Once | SUBCUTANEOUS | Status: AC
  Administered 2016-02-25: 150 mg via SUBCUTANEOUS

## 2016-03-03 ENCOUNTER — Telehealth: Payer: Self-pay | Admitting: Internal Medicine

## 2016-03-03 NOTE — Telephone Encounter (Signed)
#   vials:1 Ordered date:03/03/16 Shipping Date:03/03/16 

## 2016-03-04 NOTE — Telephone Encounter (Signed)
#   Vials:1 Arrival Date:03/04/16 Lot #:[id number] Exp Date:12/20  

## 2016-03-23 ENCOUNTER — Ambulatory Visit (INDEPENDENT_AMBULATORY_CARE_PROVIDER_SITE_OTHER): Payer: Medicare Other

## 2016-03-23 DIAGNOSIS — J4540 Moderate persistent asthma, uncomplicated: Secondary | ICD-10-CM | POA: Diagnosis not present

## 2016-03-23 MED ORDER — OMALIZUMAB 150 MG ~~LOC~~ SOLR
150.0000 mg | SUBCUTANEOUS | Status: DC
  Administered 2016-03-23: 150 mg via SUBCUTANEOUS

## 2016-03-28 ENCOUNTER — Telehealth: Payer: Self-pay | Admitting: Internal Medicine

## 2016-03-30 ENCOUNTER — Telehealth: Payer: Self-pay | Admitting: Internal Medicine

## 2016-03-30 NOTE — Telephone Encounter (Signed)
#   vials:1 Ordered date:03/30/16 Shipping Date:03/31/16 

## 2016-03-31 NOTE — Telephone Encounter (Signed)
Called and spoke with pt and she stated that her insurance is not covering the [employer]olair.  This is starting from nov 2016.  [place] stated that they are going to turn her over to collections for $178. Pt has 3 insurances Medicare is primary [employer] of [place] [employer] (3rd)  Will forward to [place] since she is working on this.  thanks

## 2016-03-31 NOTE — Telephone Encounter (Signed)
#   Vials:1 Arrival Date:8/10 Lot #:[id number] Exp Date:10/2019

## 2016-04-07 NOTE — Telephone Encounter (Signed)
Katie any update on this?  thanks

## 2016-04-08 NOTE — Telephone Encounter (Signed)
I am working with [employer] as far as when PA expired and what to do next to help pay for previously given Xolair injections. Thanks

## 2016-04-20 ENCOUNTER — Ambulatory Visit (INDEPENDENT_AMBULATORY_CARE_PROVIDER_SITE_OTHER): Payer: Medicare Other

## 2016-04-20 DIAGNOSIS — J4540 Moderate persistent asthma, uncomplicated: Secondary | ICD-10-CM

## 2016-04-20 MED ORDER — OMALIZUMAB 150 MG ~~LOC~~ SOLR
150.0000 mg | SUBCUTANEOUS | Status: DC
  Administered 2016-04-20: 150 mg via SUBCUTANEOUS

## 2016-04-27 ENCOUNTER — Telehealth: Payer: Self-pay | Admitting: Internal Medicine

## 2016-04-27 NOTE — Telephone Encounter (Signed)
Katie please advise if this message can be closed.  Thanks!  

## 2016-04-28 NOTE — Telephone Encounter (Signed)
#   Vials:1 Arrival Date:04/28/16 Lot #:[id number] Exp Date:3/21  

## 2016-04-28 NOTE — Telephone Encounter (Signed)
#   vials:1 Ordered date:04/27/16 Shipping Date:04/27/16 I didn't get the sheet to put them in until today.

## 2016-05-09 NOTE — Telephone Encounter (Signed)
Katie please provide an update if possible.  Thank you.

## 2016-05-12 NOTE — Telephone Encounter (Signed)
Katie, please advise if this has been taken care of, thanks? 

## 2016-05-18 ENCOUNTER — Ambulatory Visit: Payer: Medicare Other

## 2016-05-18 ENCOUNTER — Ambulatory Visit (INDEPENDENT_AMBULATORY_CARE_PROVIDER_SITE_OTHER): Payer: Medicare Other

## 2016-05-18 DIAGNOSIS — J4540 Moderate persistent asthma, uncomplicated: Secondary | ICD-10-CM | POA: Diagnosis not present

## 2016-05-20 MED ORDER — OMALIZUMAB 150 MG ~~LOC~~ SOLR
150.0000 mg | Freq: Once | SUBCUTANEOUS | Status: AC
  Administered 2016-05-18: 150 mg via SUBCUTANEOUS

## 2016-05-25 NOTE — Telephone Encounter (Signed)
Florentina AddisonKatie can you please provide an update? Thanks.

## 2016-05-26 ENCOUNTER — Telehealth: Payer: Self-pay | Admitting: Internal Medicine

## 2016-05-26 DIAGNOSIS — R7301 Impaired fasting glucose: Secondary | ICD-10-CM | POA: Insufficient documentation

## 2016-05-26 HISTORY — DX: Impaired fasting glucose: R73.01

## 2016-05-26 NOTE — Telephone Encounter (Signed)
#   vials: 1 Date Ordered: 05/26/16 Shipping Date:05/27/16

## 2016-05-27 NOTE — Telephone Encounter (Signed)
I have no new updates at this time. Continue working with [employer] to back date PA needed. Thanks.

## 2016-05-27 NOTE — Telephone Encounter (Signed)
#   Vials:1 Arrival Date:05/27/16 Lot #:[id number] Exp Date:4/21  

## 2016-06-07 NOTE — Telephone Encounter (Signed)
Are there any updates on this PA? Thanks.

## 2016-06-15 ENCOUNTER — Ambulatory Visit (INDEPENDENT_AMBULATORY_CARE_PROVIDER_SITE_OTHER): Payer: Medicare Other

## 2016-06-15 DIAGNOSIS — J4520 Mild intermittent asthma, uncomplicated: Secondary | ICD-10-CM | POA: Diagnosis not present

## 2016-06-15 MED ORDER — OMALIZUMAB 150 MG ~~LOC~~ SOLR
150.0000 mg | SUBCUTANEOUS | Status: DC
Start: 2016-06-15 — End: 2016-08-08
  Administered 2016-06-15: 150 mg via SUBCUTANEOUS

## 2016-06-15 NOTE — Telephone Encounter (Signed)
Katie please provide an update. Thanks.

## 2016-06-23 NOTE — Telephone Encounter (Signed)
Debra AddisonKatie can you please update this encounter? Thanks.

## 2016-06-23 NOTE — Telephone Encounter (Signed)
There are updates and information that Insurance is working on the get back dated PA for patient.

## 2016-06-30 ENCOUNTER — Telehealth: Payer: Self-pay | Admitting: Internal Medicine

## 2016-06-30 NOTE — Telephone Encounter (Signed)
#   vials:1 Ordered date:06/30/16 Shipping Date:07/01/16

## 2016-07-01 NOTE — Telephone Encounter (Signed)
#   Vials:1 Arrival Date:07/01/16 Lot #[id number] Exp Date:4/21

## 2016-07-07 NOTE — Telephone Encounter (Signed)
Katie have there been any updates on this matter? Thanks.

## 2016-07-12 NOTE — Telephone Encounter (Signed)
Working with Marisue IvanLiz regarding [employer] information. Will update as possible.

## 2016-07-13 ENCOUNTER — Ambulatory Visit (INDEPENDENT_AMBULATORY_CARE_PROVIDER_SITE_OTHER): Payer: Medicare Other

## 2016-07-13 DIAGNOSIS — J4520 Mild intermittent asthma, uncomplicated: Secondary | ICD-10-CM | POA: Diagnosis not present

## 2016-07-18 MED ORDER — OMALIZUMAB 150 MG ~~LOC~~ SOLR
150.0000 mg | SUBCUTANEOUS | Status: DC
Start: 2016-07-13 — End: 2016-08-08
  Administered 2016-07-13: 150 mg via SUBCUTANEOUS

## 2016-07-20 NOTE — Assessment & Plan Note (Signed)
She continues Xolair successfully. We discussed possible aggravation of asthma symptoms by use of Timoptic eyedrops. She will watch for this.

## 2016-07-26 ENCOUNTER — Telehealth: Payer: Self-pay | Admitting: Internal Medicine

## 2016-07-26 NOTE — Telephone Encounter (Signed)
#   vials:1 Ordered date:07/27/16 Shipping Date:07/27/16 

## 2016-07-27 NOTE — Telephone Encounter (Signed)
#   Vials:1 Arrival Date:07/27/16 Lot #:[id number] Exp Date:5/21  

## 2016-08-08 ENCOUNTER — Encounter: Payer: Self-pay | Admitting: Internal Medicine

## 2016-08-08 ENCOUNTER — Ambulatory Visit (INDEPENDENT_AMBULATORY_CARE_PROVIDER_SITE_OTHER): Payer: Medicare Other | Admitting: Internal Medicine

## 2016-08-08 VITALS — BP 116/72 | HR 56 | Ht 61.0 in | Wt 161.4 lb

## 2016-08-08 DIAGNOSIS — J4540 Moderate persistent asthma, uncomplicated: Secondary | ICD-10-CM

## 2016-08-08 DIAGNOSIS — J449 Chronic obstructive pulmonary disease, unspecified: Secondary | ICD-10-CM

## 2016-08-08 DIAGNOSIS — G4733 Obstructive sleep apnea (adult) (pediatric): Secondary | ICD-10-CM | POA: Diagnosis not present

## 2016-08-08 NOTE — Patient Instructions (Addendum)
Ok to continue present meds.  You can set Advair aside until you need it.   Order- office spirometry   Dx asthma moderate persistent uncomplicated  Please call if we can help

## 2016-08-08 NOTE — Progress Notes (Signed)
Subjective:    Patient ID: Debra CraftsLynna L Apostol, female    DOB: 08/26/1945, 70 y.o.   MRN: [id number]  HPI Female never smoker followed for chronic obstructive asthma/Xolair/often steroid dependent, complicated by allergic rhinitis, peripheral neuropathy, glaucoma,  Offce Spirometry 08/08/2016-NL. 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 Last here- October 20/2011 for preop assessment. She ended  08/03/2015-70 year old female never smoker followed for history chronic obstructive asthma/Xolair to soften been steroid dependent, complicated by allergic rhinitis, peripheral neuropathy, glaucoma, OSA ACUTE VISIT: Called Friday last week and was given Doxycycline-since taking this-the sinus pressure has decreased; however the cough continues and getting worse; productive at times-yellow in color. No fever or chills. Had skipped Xolair last week was not feeling well.  02/01/2016-70 year old female never smoker followed for chronic obstructive asthma/Xolair/often steroid dependent, complicated by allergic rhinitis, peripheral neuropathy, glaucoma, OSA/never CPAP FOLLOWS FOR: Continues on Xolair and doing well. Slight cough with congestion due to allergies; otherwise doing great. Xolair has been well-tolerated and she believes it has made a significant difference and asthma control. Took prednisone and Biaxin for a purulent bronchitis [place]. Last sleep study was negative, first was "mild" OSA.  08/08/2016-70 year old female never smoker followed for chronic obstructive asthma/Xolair/often steroid dependent, complicated by allergic rhinitis, peripheral neuropathy, glaucoma,  FOLLOWS FOR: Pt continues Xolair and no reactions. Pt denies any  breathing concerns at this time. On gabapentin for back pain-helpful. Says breathing has been good with no recent exacerbations. She continues to believe Xolair has made a major turnaround difference for her breathing long-term. Not clear if she still needs Advair. Offce Spirometry 08/08/2016-NL. FVC 2.04/77%, FEV1 1.64/81%, FEV1/FVC 0.80  ROS-see HPI- Constitutional:   No-   weight loss, night sweats, fevers, chills, +fatigue, lassitude. HEENT:   No-   difficulty swallowing, tooth/dental problems, sore throat,       No-  sneezing, itching, ear ache, nasal congestion, +post nasal drip,  CV:  + Noncardiac chest pain, no- orthopnea, PND, swelling in lower extremities,                                            anasarca, dizziness, palpitations Resp:+shortness of breath with exertion or at rest.               productive cough,   non-productive cough,  No- coughing up of blood.              No-   change in color of mucus.  +Little wheezing.   Skin: No-   rash or lesions. GI:  No-   heartburn, indigestion, abdominal pain, nausea, vomiting,  GU: pain. MS:  No-   joint pain or swelling.   Neuro-     nothing unusualk pain. Psych:  No- change in mood or affect. No depression or anxiety.  No memory loss.   Objective:   Physical Exam General- Alert, Oriented, Affect-appropriate, Distress- none acute; + overweight, NAD Skin- rash-none, lesions- none, excoriation- none Lymphadenopathy- none Head- atraumatic            Eyes- Gross vision intact, PERRLA, conjunctivae clear secretions. Chronic periorbital edema.            Ears- Hearing, canals-normal  Nose- Clear, no-Septal dev, mucus, polyps, erosion, perforation             Throat- Mallampati II-III , mucosa clear , drainage- none, tonsils- atrophic Neck- flexible , trachea midline, no stridor , thyroid nl, carotid no bruit Chest - symmetrical excursion , unlabored           Heart/CV- RRR , no murmur , no gallop  , no rub, nl s1 s2                            - JVD- none , edema- none, stasis changes- none, varices- none           Lung- clear, unlabored, dullness-none, rub- none           Chest wall-  Abd-  Br/ Gen/ Rectal- Not done, not indicated Extrem- cyanosis- none, clubbing, none, atrophy- none, strength- nl Neuro- grossly intact to observation

## 2016-08-09 NOTE — Assessment & Plan Note (Signed)
Normal spirometry today reflects sustained control exacerbations using Xolair. Not clear that she needs Advair any longer. Plan-okay to try off of Advair as discussed, restarting if needed

## 2016-08-09 NOTE — Assessment & Plan Note (Signed)
Her most recent study did not show OSA. We discussed good sleep hygiene, importance of weight, responsibility to drive safely.

## 2016-08-10 ENCOUNTER — Ambulatory Visit: Payer: Medicare Other

## 2016-08-16 ENCOUNTER — Telehealth: Payer: Self-pay | Admitting: Internal Medicine

## 2016-08-16 ENCOUNTER — Ambulatory Visit (INDEPENDENT_AMBULATORY_CARE_PROVIDER_SITE_OTHER): Payer: Medicare Other

## 2016-08-16 DIAGNOSIS — J4540 Moderate persistent asthma, uncomplicated: Secondary | ICD-10-CM | POA: Diagnosis not present

## 2016-08-16 MED ORDER — OMALIZUMAB 150 MG ~~LOC~~ SOLR
150.0000 mg | SUBCUTANEOUS | Status: DC
  Administered 2016-08-16: 150 mg via SUBCUTANEOUS

## 2016-08-16 NOTE — Telephone Encounter (Signed)
Created in error

## 2016-08-16 NOTE — Telephone Encounter (Signed)
#   vials:1 Ordered date:08/16/16 Shipping Date:08/17/16

## 2016-08-25 ENCOUNTER — Telehealth: Payer: Self-pay | Admitting: Internal Medicine

## 2016-08-26 ENCOUNTER — Telehealth: Payer: Self-pay | Admitting: Internal Medicine

## 2016-08-26 NOTE — Telephone Encounter (Signed)
Created in error

## 2016-08-26 NOTE — Telephone Encounter (Addendum)
#   vials:1 Ordered date:08/25/16 Shipping Date:08/26/16 For got to put it in. 

## 2016-08-26 NOTE — Telephone Encounter (Signed)
#   Vials:1 Arrival Date:08/26/16 Lot #:[id number] Exp Date:8/21  

## 2016-09-14 ENCOUNTER — Ambulatory Visit (INDEPENDENT_AMBULATORY_CARE_PROVIDER_SITE_OTHER): Payer: Medicare Other

## 2016-09-14 DIAGNOSIS — J4540 Moderate persistent asthma, uncomplicated: Secondary | ICD-10-CM

## 2016-09-14 MED ORDER — OMALIZUMAB 150 MG ~~LOC~~ SOLR
150.0000 mg | SUBCUTANEOUS | Status: DC
  Administered 2016-09-14: 150 mg via SUBCUTANEOUS

## 2016-09-21 ENCOUNTER — Telehealth: Payer: Self-pay | Admitting: Internal Medicine

## 2016-09-21 NOTE — Telephone Encounter (Signed)
#   vials:1 Ordered date:09/21/16 Shipping Date:09/21/16 

## 2016-09-22 NOTE — Telephone Encounter (Signed)
#   Vials:1 Arrival Date:09/22/16 Lot #[id number] Exp Date:8/21

## 2016-10-12 ENCOUNTER — Ambulatory Visit: Payer: Medicare Other

## 2016-10-12 ENCOUNTER — Ambulatory Visit (INDEPENDENT_AMBULATORY_CARE_PROVIDER_SITE_OTHER): Payer: Medicare Other

## 2016-10-12 DIAGNOSIS — J4520 Mild intermittent asthma, uncomplicated: Secondary | ICD-10-CM

## 2016-10-13 MED ORDER — OMALIZUMAB 150 MG ~~LOC~~ SOLR
150.0000 mg | SUBCUTANEOUS | Status: DC
  Administered 2016-10-12: 150 mg via SUBCUTANEOUS

## 2016-10-13 NOTE — Progress Notes (Signed)
Documentation of medication administration of Xolair and charges have been completed by Elayah Klooster, CMA based on the Xolair documentation sheet completed by Tammy Scott.  

## 2016-10-17 ENCOUNTER — Telehealth: Payer: Self-pay | Admitting: Internal Medicine

## 2016-10-17 NOTE — Telephone Encounter (Signed)
#   vials:1 Ordered date:10/17/16 Shipping Date:10/17/16 

## 2016-10-18 NOTE — Telephone Encounter (Signed)
#   Vials:1 Arrival Date:10/18/16 Lot #:[id number] Exp Date:9/21  

## 2016-11-09 ENCOUNTER — Ambulatory Visit (INDEPENDENT_AMBULATORY_CARE_PROVIDER_SITE_OTHER): Payer: Medicare Other

## 2016-11-09 ENCOUNTER — Ambulatory Visit: Payer: Medicare Other

## 2016-11-09 DIAGNOSIS — J4540 Moderate persistent asthma, uncomplicated: Secondary | ICD-10-CM

## 2016-11-10 MED ORDER — OMALIZUMAB 150 MG ~~LOC~~ SOLR
150.0000 mg | SUBCUTANEOUS | Status: DC
  Administered 2016-11-09: 150 mg via SUBCUTANEOUS

## 2016-11-10 NOTE — Progress Notes (Signed)
Xolair injection documentation and charges entered by Adelheid Hoggard, RMA, based on injection sheet filled out by Tammy Scott per office protocol.   

## 2016-11-14 ENCOUNTER — Telehealth: Payer: Self-pay | Admitting: Internal Medicine

## 2016-11-14 NOTE — Telephone Encounter (Signed)
#   vials:1 Ordered date:11/14/16 Shipping Date:11/14/16 

## 2016-11-15 NOTE — Telephone Encounter (Signed)
#   Vials:1 Arrival Date:11/15/16 Lot #:[id number] Exp Date:9/21  

## 2016-12-07 ENCOUNTER — Ambulatory Visit (INDEPENDENT_AMBULATORY_CARE_PROVIDER_SITE_OTHER): Payer: Medicare Other

## 2016-12-07 ENCOUNTER — Ambulatory Visit: Payer: Medicare Other

## 2016-12-07 DIAGNOSIS — J449 Chronic obstructive pulmonary disease, unspecified: Secondary | ICD-10-CM

## 2016-12-09 MED ORDER — OMALIZUMAB 150 MG ~~LOC~~ SOLR
150.0000 mg | Freq: Once | SUBCUTANEOUS | Status: AC
  Administered 2016-12-07: 150 mg via SUBCUTANEOUS

## 2016-12-13 ENCOUNTER — Telehealth: Payer: Self-pay | Admitting: Internal Medicine

## 2016-12-14 NOTE — Telephone Encounter (Signed)
#   vials:1 Ordered date:12/13/16 Shipping Date:12/13/16 

## 2016-12-14 NOTE — Telephone Encounter (Signed)
#   Vials:1 Arrival Date:12/14/16 Lot #:[id number] Exp Date:9/21  

## 2017-01-04 ENCOUNTER — Ambulatory Visit (INDEPENDENT_AMBULATORY_CARE_PROVIDER_SITE_OTHER): Payer: Medicare Other

## 2017-01-04 DIAGNOSIS — J4540 Moderate persistent asthma, uncomplicated: Secondary | ICD-10-CM

## 2017-01-05 MED ORDER — OMALIZUMAB 150 MG ~~LOC~~ SOLR
150.0000 mg | SUBCUTANEOUS | Status: DC
  Administered 2017-01-04: 150 mg via SUBCUTANEOUS

## 2017-01-05 NOTE — Progress Notes (Signed)
Xolair injection documentation and charges entered by Ashley Caulfield, RMA, based on injection sheet filled out by Tammy Scott per office protocol.   

## 2017-01-11 ENCOUNTER — Telehealth: Payer: Self-pay | Admitting: Internal Medicine

## 2017-01-11 NOTE — Telephone Encounter (Signed)
#   vials:1 Ordered date:01/11/17 Shipping Date:01/11/17

## 2017-01-12 NOTE — Telephone Encounter (Signed)
#   Vials:1 Arrival Date:01/12/17 Lot #:[id number] Exp Date:10/21

## 2017-02-01 ENCOUNTER — Ambulatory Visit: Payer: Medicare Other

## 2017-02-01 ENCOUNTER — Ambulatory Visit (INDEPENDENT_AMBULATORY_CARE_PROVIDER_SITE_OTHER): Payer: Medicare Other

## 2017-02-01 DIAGNOSIS — J4520 Mild intermittent asthma, uncomplicated: Secondary | ICD-10-CM | POA: Diagnosis not present

## 2017-02-01 MED ORDER — OMALIZUMAB 150 MG ~~LOC~~ SOLR
150.0000 mg | SUBCUTANEOUS | Status: DC
  Administered 2017-02-01: 150 mg via SUBCUTANEOUS

## 2017-02-01 NOTE — Progress Notes (Signed)
Documentation of medication administration of Xolair and charges have been completed by Loreal Schuessler, CMA based on the Xolair documentation sheet completed by Tammy Scott.  

## 2017-02-06 ENCOUNTER — Telehealth: Payer: Self-pay | Admitting: Internal Medicine

## 2017-02-07 ENCOUNTER — Telehealth: Payer: Self-pay | Admitting: Internal Medicine

## 2017-02-07 NOTE — Telephone Encounter (Signed)
#   Vials:1 Arrival Date:02/07/17 Lot #:[id number] Exp Date:11/21

## 2017-02-07 NOTE — Telephone Encounter (Signed)
Created in error

## 2017-02-07 NOTE — Telephone Encounter (Signed)
#   vials:1 Ordered date:02/06/17 Shipping Date:02/06/17 forgot to put it in.

## 2017-02-08 ENCOUNTER — Ambulatory Visit (INDEPENDENT_AMBULATORY_CARE_PROVIDER_SITE_OTHER): Payer: Medicare Other | Admitting: Internal Medicine

## 2017-02-08 ENCOUNTER — Encounter: Payer: Self-pay | Admitting: Internal Medicine

## 2017-02-08 DIAGNOSIS — J4540 Moderate persistent asthma, uncomplicated: Secondary | ICD-10-CM | POA: Diagnosis not present

## 2017-02-08 DIAGNOSIS — J3089 Other allergic rhinitis: Secondary | ICD-10-CM

## 2017-02-08 DIAGNOSIS — J302 Other seasonal allergic rhinitis: Secondary | ICD-10-CM

## 2017-02-08 NOTE — Progress Notes (Signed)
Subjective:    Patient ID: Debra CraftsLynna L Panchal, female    DOB: 08/24/1945, 71 y.o.   MRN: [id number]  HPI Female never smoker followed for chronic obstructive asthma/Xolair/often steroid dependent, complicated by allergic rhinitis, peripheral neuropathy, glaucoma,  Offce Spirometry 08/08/2016-NL. FVC 2.04/77%, FEV1 1.64/81%, FEV1/FVC 0.80  ----------------------------------------------------  08/08/2016-71 year old female never smoker followed for chronic obstructive asthma/Xolair/often steroid dependent, complicated by allergic rhinitis, peripheral neuropathy, glaucoma,  FOLLOWS FOR: Pt continues Xolair and no reactions. Pt denies any breathing concerns at this time. On gabapentin for back pain-helpful. Says breathing has been good with no recent exacerbations. She continues to believe Xolair has made a major turnaround difference for her breathing long-term. Not clear if she still needs Advair. Offce Spirometry 08/08/2016-NL. FVC 2.04/77%, FEV1 1.64/81%, FEV1/FVC 0.80  02/08/17- 71 year old female never smoker followed for chronic obstructive asthma/Xolair/often steroid dependent, complicated by allergic rhinitis, peripheral neuropathy, glaucoma,  6 month follow up for asthma. States her breathing has been doing well. Even with the recent heatwave. Denies any SOB or chest pains today.  She continues to feel asthma control has been very much better since she has been on Xolair monthly injections. No recent exacerbation. Rare need now for rescue inhaler or nebulizer. Post-common trigger now is occasional cold. 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  ROS-see HPI- Constitutional:   No-   weight loss, night sweats, fevers, chills, +fatigue, lassitude. HEENT:   No-   difficulty swallowing, tooth/dental problems, sore throat,       No-  sneezing, itching, ear ache, nasal congestion, +post nasal drip,  CV:  + Noncardiac chest pain, no- orthopnea,  PND, swelling in lower extremities,                                            anasarca, dizziness, palpitations Resp:+shortness of breath with exertion or at rest.               productive cough,   non-productive cough,  No- coughing up of blood.              No-   change in color of mucus.  +Little wheezing.   Skin: No-   rash or lesions. GI:  No-   heartburn, indigestion, abdominal pain, nausea, vomiting,  GU: pain. MS:  No-   joint pain or swelling.   Neuro-     nothing unusualk pain. Psych:  No- change in mood or affect. No depression or anxiety.  No memory loss.   Objective:   Physical Exam             Stable baseline exam General- Alert, Oriented, Affect-appropriate, Distress- none acute; + overweight, NAD Skin- rash-none, lesions- none, excoriation- none Lymphadenopathy- none Head- atraumatic            Eyes- Gross vision intact, PERRLA, conjunctivae clear secretions. Chronic periorbital edema.            Ears- Hearing, canals-normal            Nose- Clear, no-Septal dev, mucus, polyps, erosion, perforation             Throat- Mallampati II-III , mucosa clear , drainage- none, tonsils- atrophic Neck- flexible , trachea midline, no stridor , thyroid nl, carotid no bruit Chest - symmetrical excursion , unlabored  Heart/CV- RRR , no murmur , no gallop  , no rub, nl s1 s2                           - JVD- none , edema- none, stasis changes- none, varices- none           Lung- clear, unlabored, dullness-none, rub- none           Chest wall-  Abd-  Br/ Gen/ Rectal- Not done, not indicated Extrem- cyanosis- none, clubbing, none, atrophy- none, strength- nl Neuro- grossly intact to observation

## 2017-02-08 NOTE — Patient Instructions (Addendum)
Ok to continue present meds- please let us know when you need refills  Ok to continue Xolair- glad it has helped so much  Please call as needed

## 2017-02-08 NOTE — Assessment & Plan Note (Signed)
Symptoms have been much milder/better controlled. Either age or Xolair or both have helped.

## 2017-02-08 NOTE — Assessment & Plan Note (Signed)
Uncomplicated, well controlled on Xolair she is very pleased with current therapy. No changes required.

## 2017-02-13 ENCOUNTER — Ambulatory Visit: Payer: Medicare Other | Admitting: Internal Medicine

## 2017-02-20 ENCOUNTER — Other Ambulatory Visit: Payer: Self-pay

## 2017-02-20 MED ORDER — ROSUVASTATIN CALCIUM 5 MG PO TABS: 5.0000 mg | ORAL_TABLET | Freq: Every day | ORAL | 3 refills | Status: DC

## 2017-02-20 NOTE — Telephone Encounter (Signed)
Received [facility] from [facility] for [facility]ill of rosuvastatin. Refill sent.

## 2017-03-01 ENCOUNTER — Ambulatory Visit (INDEPENDENT_AMBULATORY_CARE_PROVIDER_SITE_OTHER): Payer: Medicare Other

## 2017-03-01 ENCOUNTER — Ambulatory Visit: Payer: Medicare Other

## 2017-03-01 DIAGNOSIS — J4520 Mild intermittent asthma, uncomplicated: Secondary | ICD-10-CM | POA: Diagnosis not present

## 2017-03-01 MED ORDER — OMALIZUMAB 150 MG ~~LOC~~ SOLR
150.0000 mg | SUBCUTANEOUS | Status: DC
  Administered 2017-03-01: 150 mg via SUBCUTANEOUS

## 2017-03-20 ENCOUNTER — Telehealth: Payer: Self-pay | Admitting: Internal Medicine

## 2017-03-20 DIAGNOSIS — K219 Gastro-esophageal reflux disease without esophagitis: Secondary | ICD-10-CM | POA: Insufficient documentation

## 2017-03-20 HISTORY — DX: Gastro-esophageal reflux disease without esophagitis: K21.9

## 2017-03-20 MED ORDER — EPINEPHRINE 0.3 MG/0.3ML IJ SOAJ: 0.3000 mg | Freq: Once | INTRAMUSCULAR | 3 refills | Status: AC

## 2017-03-20 MED ORDER — LORATADINE 10 MG PO TABS: 10.0000 mg | ORAL_TABLET | Freq: Every day | ORAL | 3 refills | Status: DC

## 2017-03-20 MED ORDER — ALBUTEROL SULFATE HFA 108 (90 BASE) MCG/ACT IN AERS: 2.0000 | INHALATION_SPRAY | Freq: Four times a day (QID) | RESPIRATORY_TRACT | 3 refills | Status: DC | PRN

## 2017-03-20 MED ORDER — FLUTICASONE-SALMETEROL 100-50 MCG/DOSE IN AEPB: INHALATION_SPRAY | RESPIRATORY_TRACT | 3 refills | Status: DC

## 2017-03-20 MED ORDER — FLUTICASONE PROPIONATE 50 MCG/ACT NA SUSP: 2.0000 | Freq: Every day | NASAL | 3 refills | Status: DC

## 2017-03-20 NOTE — Telephone Encounter (Signed)
Called and spoke with pt and she is aware of rx that are up front and ready to be picked up. Nothing further is needed.

## 2017-03-20 NOTE — Telephone Encounter (Signed)
Called spoke with patient and verified the medications that she is requesting Patient is requesting a 3 month supply x1 year and reports that CY is aware of this pending requesy Pt would like to pick up these Rx's  Last ov 6.20.18 Rx's printed and placed on CY cart to await signature

## 2017-03-20 NOTE — Telephone Encounter (Signed)
done

## 2017-03-22 ENCOUNTER — Telehealth: Payer: Self-pay | Admitting: Internal Medicine

## 2017-03-22 NOTE — Telephone Encounter (Signed)
#   vials:1 Ordered date:03/22/17 Shipping Date:03/22/17

## 2017-03-23 NOTE — Telephone Encounter (Signed)
#   Vials:1 Arrival Date:03/23/17 Lot#: [id number] Exp Date:11/21  

## 2017-03-29 ENCOUNTER — Ambulatory Visit (INDEPENDENT_AMBULATORY_CARE_PROVIDER_SITE_OTHER): Payer: Medicare Other

## 2017-03-29 DIAGNOSIS — J4540 Moderate persistent asthma, uncomplicated: Secondary | ICD-10-CM

## 2017-03-31 ENCOUNTER — Telehealth: Payer: Self-pay | Admitting: Cardiology

## 2017-03-31 NOTE — Telephone Encounter (Signed)
Best to stop her clopidogrel for biopsy

## 2017-03-31 NOTE — Telephone Encounter (Signed)
Patient is having a breast biopsy. Please advise.

## 2017-03-31 NOTE — Telephone Encounter (Signed)
Left message to return call on patient's cell per her request.

## 2017-03-31 NOTE — Telephone Encounter (Signed)
Is having a biopsy Monday and is on Plavix, wants to know if she needs to stop it before

## 2017-03-31 NOTE — Telephone Encounter (Signed)
Patient advised to stop Plavix today and resume when surgeon states okay.

## 2017-04-03 MED ORDER — OMALIZUMAB 150 MG ~~LOC~~ SOLR
150.0000 mg | Freq: Once | SUBCUTANEOUS | Status: AC
Start: 2017-03-29 — End: 2017-03-29
  Administered 2017-03-29: 150 mg via SUBCUTANEOUS

## 2017-04-25 ENCOUNTER — Telehealth: Payer: Self-pay | Admitting: Internal Medicine

## 2017-04-25 NOTE — Telephone Encounter (Signed)
#   vials:1 Ordered date:04/25/17 Shipping Date:04/25/17

## 2017-04-26 ENCOUNTER — Ambulatory Visit (INDEPENDENT_AMBULATORY_CARE_PROVIDER_SITE_OTHER): Payer: Medicare Other

## 2017-04-26 DIAGNOSIS — J4540 Moderate persistent asthma, uncomplicated: Secondary | ICD-10-CM | POA: Diagnosis not present

## 2017-04-26 NOTE — Telephone Encounter (Signed)
#   Vials:1 Arrival Date:04/26/17 Lot #[id number] Exp Date:09/2020

## 2017-04-27 MED ORDER — OMALIZUMAB 150 MG ~~LOC~~ SOLR
150.0000 mg | SUBCUTANEOUS | Status: DC
  Administered 2017-04-26: 150 mg via SUBCUTANEOUS

## 2017-04-27 NOTE — Progress Notes (Signed)
Xolair injection documentation and charges entered by Aaria Happ, RMA, based on injection sheet filled out by Tammy Scott per office protocol.   

## 2017-05-03 ENCOUNTER — Telehealth: Payer: Self-pay | Admitting: Internal Medicine

## 2017-05-03 MED ORDER — CLARITHROMYCIN 500 MG PO TABS: 500.0000 mg | ORAL_TABLET | Freq: Two times a day (BID) | ORAL | 0 refills | Status: DC

## 2017-05-03 MED ORDER — PREDNISONE 10 MG PO TABS: ORAL_TABLET | ORAL | 0 refills | Status: DC

## 2017-05-03 NOTE — Telephone Encounter (Signed)
Pt returned call.  Told of the prescription sent to pharmacy.  No need to call back.

## 2017-05-03 NOTE — Telephone Encounter (Signed)
Called pt, she states she usually keeps an ABX and Prednisone on hand if she is feeling like she is getting symptoms. She states a family member she was around had bronchitis and she just wants too take precaution if she caught the illness. In the event, the weather keeps her in. CY please advise.  Current Outpatient Prescriptions on File Prior to Visit  Medication Sig Dispense Refill  . albuterol (PROAIR HFA) 108 (90 Base) MCG/ACT inhaler Inhale 2 puffs into the lungs every 6 (six) hours as needed for wheezing. 3 Inhaler 3  . albuterol (PROVENTIL) (2.5 MG/3ML) 0.083% nebulizer solution Take 3 mLs (2.5 mg total) by nebulization 4 (four) times daily as needed for wheezing. 120 vial 12  . brimonidine (ALPHAGAN) 0.15 % ophthalmic solution Place 1 drop into both eyes 2 (two) times daily.    . calcium gluconate 500 MG tablet Take 500 mg by mouth 2 (two) times daily.      . cholecalciferol (VITAMIN D) 1000 UNITS tablet Take 1,000 Units by mouth daily.    . clopidogrel (PLAVIX) 75 MG tablet Take 75 mg by mouth daily.      . Coenzyme Q10 (COQ10) 100 MG CAPS Take 1 capsule by mouth daily.    Marland Kitchen. dexlansoprazole (DEXILANT) 60 MG capsule Take 60 mg by mouth daily.      . fish oil-omega-3 fatty acids 1000 MG capsule 4 by mouth daily     . fluticasone (FLONASE) 50 MCG/ACT nasal spray Place 2 sprays into both nostrils daily. 3 g 3  . Fluticasone-Salmeterol (ADVAIR DISKUS) 100-50 MCG/DOSE AEPB Inhale 1 puff and rinse mouth, twice daily 3 each 3  . gabapentin (NEURONTIN) 300 MG capsule Take 300 mg by mouth at bedtime.  3  . latanoprost (XALATAN) 0.005 % ophthalmic solution Place 1 drop into both eyes at bedtime.    Marland Kitchen. loratadine (CLARITIN) 10 MG tablet Take 1 tablet (10 mg total) by mouth daily. 90 tablet 3  . losartan (COZAAR) 50 MG tablet Take 50 mg by mouth daily.      Marland Kitchen. pyridoxine (B-6) 100 MG tablet Take 100 mg by mouth daily.      . rosuvastatin (CRESTOR) 5 MG tablet Take 1 tablet (5 mg total) by mouth daily.  Take 1/2 tablet every other day and 1 tablet every other day 90 tablet 3  . vitamin B-12 (CYANOCOBALAMIN) 1000 MCG tablet Take 1,000 mcg by mouth daily.     Current Facility-Administered Medications on File Prior to Visit  Medication Dose Route Frequency Provider Last Rate Last Dose  . omalizumab Geoffry Paradise(XOLAIR) injection 150 mg  150 mg Subcutaneous Q14 Days Jetty DuhamelYoung, Clinton D, MD   150 mg at 04/26/17 1016   Allergies  Allergen Reactions  . Azithromycin   . Cefaclor   . Iodine   . Levofloxacin   . Montelukast Sodium   . Penicillins   . Sulfonamide Derivatives       [facility] 

## 2017-05-03 NOTE — Telephone Encounter (Signed)
ATC pt, no answer. Left message for pt to call back.  

## 2017-05-03 NOTE — Telephone Encounter (Signed)
Ok Biaxin 500 mg, # 14, 1 twice daily       Prednisone 10 mg, # 20, 4 X 2 DAYS, 3 X 2 DAYS, 2 X 2 DAYS, 1 X 2 DAYS

## 2017-05-03 NOTE — Telephone Encounter (Signed)
[facility] [place]. Attempted to call pt to let her know but no answer. Left message to call back.

## 2017-05-24 ENCOUNTER — Ambulatory Visit: Payer: Medicare Other

## 2017-05-24 ENCOUNTER — Ambulatory Visit (INDEPENDENT_AMBULATORY_CARE_PROVIDER_SITE_OTHER): Payer: Medicare Other

## 2017-05-24 DIAGNOSIS — J4540 Moderate persistent asthma, uncomplicated: Secondary | ICD-10-CM

## 2017-05-25 MED ORDER — OMALIZUMAB 150 MG ~~LOC~~ SOLR
150.0000 mg | SUBCUTANEOUS | Status: DC
  Administered 2017-05-24: 150 mg via SUBCUTANEOUS

## 2017-05-25 NOTE — Progress Notes (Signed)
Xolair injection documentation and charges entered by Sheika Coutts, RMA, based on injection sheet filled out by Tammy Scott per office protocol.   

## 2017-06-15 ENCOUNTER — Telehealth: Payer: Self-pay | Admitting: Internal Medicine

## 2017-06-15 NOTE — Telephone Encounter (Signed)
#   vials:1 Ordered date:06/15/17 Shipping Date:06/15/17 

## 2017-06-16 ENCOUNTER — Telehealth: Payer: Self-pay | Admitting: Internal Medicine

## 2017-06-16 MED ORDER — PREDNISONE 10 MG PO TABS: ORAL_TABLET | ORAL | 0 refills | Status: DC

## 2017-06-16 MED ORDER — DOXYCYCLINE HYCLATE 100 MG PO TABS: 100.0000 mg | ORAL_TABLET | Freq: Two times a day (BID) | ORAL | 0 refills | Status: DC

## 2017-06-16 NOTE — Telephone Encounter (Signed)
Spoke with the pt and notified of recs per CDY  She verbalized understanding and rx was sent to pharm   

## 2017-06-16 NOTE — Telephone Encounter (Signed)
Still waiting on RB's response.

## 2017-06-16 NOTE — Telephone Encounter (Signed)
#   Vials:1 Arrival Date:06/16/17 Lot #:[id number] Exp Date:03.2022  

## 2017-06-16 NOTE — Telephone Encounter (Signed)
Pt called back on the status of this message

## 2017-06-16 NOTE — Telephone Encounter (Signed)
Please call in prednisone: Take 40mg  daily for 3 days, then 30mg  daily for 3 days, then 20mg  daily for 3 days, then 10mg  daily for 3 days, then stop Call in doxycycline 100mg  bid x 7 days

## 2017-06-16 NOTE — Telephone Encounter (Signed)
Called and spoke with pt and she stated that her husband and her daughter are both sick with an URI and they are both on abx and prednisone.  She stated that she started with the same symptoms and started with her chest feeling very raw today, head congestion with yellow sputum, cough.   Pt did not want to come in for appt, but wanted to see if CY would call something in for her.  CY is out of the office today, will forward to RB-- please advise.  Thanks  Last ov--02/08/17 Next ov--08/10/17  Allergies  Allergen Reactions  . Azithromycin   . Cefaclor   . Iodine   . Levofloxacin   . Montelukast Sodium   . Penicillins   . Sulfonamide Derivatives

## 2017-06-21 ENCOUNTER — Ambulatory Visit (INDEPENDENT_AMBULATORY_CARE_PROVIDER_SITE_OTHER): Payer: Medicare Other

## 2017-06-21 DIAGNOSIS — J4540 Moderate persistent asthma, uncomplicated: Secondary | ICD-10-CM

## 2017-06-22 MED ORDER — OMALIZUMAB 150 MG ~~LOC~~ SOLR
150.0000 mg | SUBCUTANEOUS | Status: DC
  Administered 2017-06-21: 150 mg via SUBCUTANEOUS

## 2017-06-22 NOTE — Progress Notes (Signed)
Documentation of medication administration and charges of Xolair have been completed by Jathniel Smeltzer, CMA based on the Xolair documentation sheet completed by Tammy Scott.  

## 2017-07-04 ENCOUNTER — Ambulatory Visit (INDEPENDENT_AMBULATORY_CARE_PROVIDER_SITE_OTHER): Payer: Medicare Other | Admitting: Cardiology

## 2017-07-04 ENCOUNTER — Encounter: Payer: Self-pay | Admitting: Cardiology

## 2017-07-04 VITALS — BP 130/80 | HR 76 | Resp 10 | Ht 61.0 in | Wt 164.0 lb

## 2017-07-04 DIAGNOSIS — J4540 Moderate persistent asthma, uncomplicated: Secondary | ICD-10-CM | POA: Diagnosis not present

## 2017-07-04 DIAGNOSIS — R002 Palpitations: Secondary | ICD-10-CM | POA: Diagnosis not present

## 2017-07-04 DIAGNOSIS — E782 Mixed hyperlipidemia: Secondary | ICD-10-CM | POA: Diagnosis not present

## 2017-07-04 DIAGNOSIS — I739 Peripheral vascular disease, unspecified: Secondary | ICD-10-CM

## 2017-07-04 NOTE — Progress Notes (Signed)
Cardiology Office Note:    Date:  07/04/2017   ID:  Debra Byrd, DOB 01/20/1946, MRN [id number]  PCP:  Irena Reichmannollins, Dana, DO  Cardiologist:  Gypsy Balsamobert Hubbard Seldon, MD    Referring MD: Irena Reichmannollins, Dana, DO   Chief Complaint  Patient presents with  . Follow-up  Regular follow-up am doing well  History of Present Illness:    Debra Byrd is a 71 y.o. female with peripheral vascular disease as well as some mild mitral valve prolapse and mitral regurgitation.  She is doing well denies have any chest pain tightness squeezing pressure burning chest.  No palpitations.  She is trying to be active but she does have some chronic back problem which created an issue.  Past Medical History:  Diagnosis Date  . Allergic rhinitis, cause unspecified   . Asthma   . Chronic airway obstruction, not elsewhere classified   . Coronary atherosclerosis of unspecified type of vessel, native or graft   . Mitral valve disorders(424.0)   . Mononeuritis of unspecified site   . Myalgia and myositis, unspecified   . Peripheral vascular disease, unspecified (HCC)   . Sleep disturbance, unspecified   . Unspecified glaucoma(365.9)     Past Surgical History:  Procedure Laterality Date  . ROTATOR CUFF REPAIR      Current Medications: Current Meds  Medication Sig  . albuterol (PROAIR HFA) 108 (90 Base) MCG/ACT inhaler Inhale 2 puffs into the lungs every 6 (six) hours as needed for wheezing.  Marland Kitchen. albuterol (PROVENTIL) (2.5 MG/3ML) 0.083% nebulizer solution Take 3 mLs (2.5 mg total) by nebulization 4 (four) times daily as needed for wheezing.  . brimonidine (ALPHAGAN) 0.15 % ophthalmic solution Place 1 drop into both eyes 2 (two) times daily.  . calcium gluconate 500 MG tablet Take 500 mg by mouth 2 (two) times daily.    . cholecalciferol (VITAMIN D) 1000 UNITS tablet Take 1,000 Units by mouth daily.  . clopidogrel (PLAVIX) 75 MG tablet Take 75 mg by mouth daily.    . Coenzyme Q10 (COQ10) 100 MG CAPS Take 1 capsule by  mouth daily.  Marland Kitchen. dexlansoprazole (DEXILANT) 60 MG capsule Take 60 mg by mouth daily.    . fish oil-omega-3 fatty acids 1000 MG capsule 4 by mouth daily   . fluticasone (FLONASE) 50 MCG/ACT nasal spray Place 2 sprays into both nostrils daily.  . Fluticasone-Salmeterol (ADVAIR DISKUS) 100-50 MCG/DOSE AEPB Inhale 1 puff and rinse mouth, twice daily  . gabapentin (NEURONTIN) 300 MG capsule Take 300 mg by mouth at bedtime.  Marland Kitchen. latanoprost (XALATAN) 0.005 % ophthalmic solution Place 1 drop into both eyes at bedtime.  Marland Kitchen. loratadine (CLARITIN) 10 MG tablet Take 1 tablet (10 mg total) by mouth daily.  Marland Kitchen. losartan (COZAAR) 50 MG tablet Take 50 mg by mouth daily.    Marland Kitchen. pyridoxine (B-6) 100 MG tablet Take 100 mg by mouth daily.    . rosuvastatin (CRESTOR) 5 MG tablet Take 1 tablet (5 mg total) by mouth daily. Take 1/2 tablet every other day and 1 tablet every other day  . vitamin B-12 (CYANOCOBALAMIN) 1000 MCG tablet Take 1,000 mcg by mouth daily.   Current Facility-Administered Medications for the 07/04/17 encounter (Office Visit) with Georgeanna LeaKrasowski, Khup Sapia J, MD  Medication  . omalizumab Geoffry Paradise(XOLAIR) injection 150 mg  . omalizumab Geoffry Paradise(XOLAIR) injection 150 mg  . omalizumab Geoffry Paradise(XOLAIR) injection 150 mg     Allergies:   Azithromycin; Cefaclor; Iodine; Levofloxacin; Montelukast sodium; Penicillins; and Sulfonamide derivatives   Social History  Socioeconomic History  . Marital status: Married    Spouse name: None  . Number of children: None  . Years of education: None  . Highest education level: None  Social Needs  . Financial resource strain: None  . Food insecurity - worry: None  . Food insecurity - inability: None  . Transportation needs - medical: None  . Transportation needs - non-medical: None  Occupational History  . None  Tobacco Use  . Smoking status: Never Smoker  . Smokeless tobacco: Never Used  Substance and Sexual Activity  . Alcohol use: No  . Drug use: No  . Sexual activity: None  Other  Topics Concern  . None  Social History Narrative  . None     Family History: The patient's family history includes Emphysema in her maternal uncle; Heart disease in her father. ROS:   Please see the history of present illness.    All 14 point review of systems negative except as described per history of present illness  EKGs/Labs/Other Studies Reviewed:      Recent Labs: No results found for requested labs within last 8760 hours.  Recent Lipid Panel No results found for: CHOL, TRIG, HDL, CHOLHDL, VLDL, LDLCALC, LDLDIRECT  Physical Exam:    VS:  BP 130/80   Pulse 76   Resp 10   Ht 5\' 1"  (1.549 m)   Wt 164 lb (74.4 kg)   BMI 30.99 kg/m     Wt Readings from Last 3 Encounters:  07/04/17 164 lb (74.4 kg)  02/08/17 165 lb 6.4 oz (75 kg)  08/08/16 161 lb 6.4 oz (73.2 kg)     GEN:  Well nourished, well developed in no acute distress HEENT: Normal NECK: No JVD; No carotid bruits LYMPHATICS: No lymphadenopathy CARDIAC: RRR, soft systolic murmur grade 1/6 best heard at the left for the sternum, no rubs, no gallops RESPIRATORY:  Clear to auscultation without rales, wheezing or rhonchi  ABDOMEN: Soft, non-tender, non-distended MUSCULOSKELETAL:  No edema; No deformity  SKIN: Warm and dry LOWER EXTREMITIES: no swelling NEUROLOGIC:  Alert and oriented x 3 PSYCHIATRIC:  Normal affect   ASSESSMENT:    1. PERIPHERAL VASCULAR DISEASE   2. Moderate persistent asthma without complication   3. Mixed hyperlipidemia   4. Palpitation    PLAN:    In order of problems listed above:  1. Peripheral vascular disease in form of right carotid arterial disease.  Last MRI reviewed with the patient she is due to have another one in the fall of next year.  She is on high intensity statin as well as antiplatelet agents which I will continue for now.  No neurological symptoms 2. Asthma: She is doing very well with new medication and she is very happy with it 3. Dyslipidemia: We will check her  fasting lipid profile 4. Palpitations: Denies having any   Medication Adjustments/Labs and Tests Ordered: Current medicines are reviewed at length with the patient today.  Concerns regarding medicines are outlined above.  No orders of the defined types were placed in this encounter.  Medication changes: No orders of the defined types were placed in this encounter.   Signed, Georgeanna Leaobert J. Montia Haslip, MD, [facility] 07/04/2017 12:04 PM    [facility]

## 2017-07-04 NOTE — Patient Instructions (Addendum)
Medication Instructions:  Your physician recommends that you continue on your current medications as directed. Please refer to the Current Medication list given to you today.  Labwork: Your physician recommends that you have lab work today to check your cholesterol.  Testing/Procedures: EKG today in office.   Follow-Up: Your physician wants you to follow-up in: 6 months with Dr. Dulce SellarMunley. You will receive a reminder letter in the mail two months in advance. If you don't receive a letter, please call our office to schedule the follow-up appointment. Any Other Special Instructions Will Be Listed Below (If Applicable).  Please note that any paperwork needing to be filled out by the provider will need to be addressed at the front desk prior to seeing the provider. Please note that any paperwork FMLA, Disability or other documents regarding health condition is subject to a $25.00 charge that must be received prior to completion of paperwork in the form of a money order or check.    If you need a refill on your cardiac medications before your next appointment, please call your pharmacy.

## 2017-07-05 LAB — LIPID PANEL
Chol/HDL Ratio: 2.1 ratio (ref 0.0–4.4)
Cholesterol, Total: 131 mg/dL (ref 100–199)
HDL: 62 mg/dL (ref >39)
LDL Calculated: 57 mg/dL (ref 0–99)
Triglycerides: 60 mg/dL (ref 0–149)
VLDL Cholesterol Cal: 12 mg/dL (ref 5–40)

## 2017-07-10 ENCOUNTER — Telehealth: Payer: Self-pay | Admitting: Internal Medicine

## 2017-07-10 NOTE — Telephone Encounter (Signed)
#   vials:1 Ordered date:07/10/17 Shipping Date:07/10/17

## 2017-07-11 NOTE — Telephone Encounter (Signed)
#   Vials:1 Arrival Date:07/11/17 Lot #[id number] Exp Date:10/2020

## 2017-07-19 ENCOUNTER — Ambulatory Visit (INDEPENDENT_AMBULATORY_CARE_PROVIDER_SITE_OTHER): Payer: Medicare Other

## 2017-07-19 DIAGNOSIS — J4540 Moderate persistent asthma, uncomplicated: Secondary | ICD-10-CM | POA: Diagnosis not present

## 2017-07-21 MED ORDER — OMALIZUMAB 150 MG ~~LOC~~ SOLR
150.0000 mg | Freq: Once | SUBCUTANEOUS | Status: AC
  Administered 2017-07-19: 150 mg via SUBCUTANEOUS

## 2017-08-03 DIAGNOSIS — J22 Unspecified acute lower respiratory infection: Secondary | ICD-10-CM | POA: Insufficient documentation

## 2017-08-03 HISTORY — DX: Unspecified acute lower respiratory infection: J22

## 2017-08-08 ENCOUNTER — Telehealth: Payer: Self-pay | Admitting: Internal Medicine

## 2017-08-10 ENCOUNTER — Ambulatory Visit (INDEPENDENT_AMBULATORY_CARE_PROVIDER_SITE_OTHER): Payer: Medicare Other | Admitting: Internal Medicine

## 2017-08-10 ENCOUNTER — Other Ambulatory Visit: Payer: Medicare Other

## 2017-08-10 ENCOUNTER — Encounter: Payer: Self-pay | Admitting: Internal Medicine

## 2017-08-10 VITALS — BP 112/60 | HR 62 | Ht 61.0 in | Wt 167.0 lb

## 2017-08-10 DIAGNOSIS — Z881 Allergy status to other antibiotic agents status: Secondary | ICD-10-CM | POA: Diagnosis not present

## 2017-08-10 DIAGNOSIS — J4540 Moderate persistent asthma, uncomplicated: Secondary | ICD-10-CM | POA: Diagnosis not present

## 2017-08-10 NOTE — Telephone Encounter (Signed)
#   vials:1 Ordered date:08/08/17 Shipping Date:08/08/17 I was off, Florentina AddisonKatie called order in late afternoon, and there was an emergency staff meeting..Marland Kitchen

## 2017-08-10 NOTE — Telephone Encounter (Signed)
#   Vials:1 Arrival Date:08/09/17 Lot #:[id number] Exp Date:11/2020 Order wasn't put in.  

## 2017-08-10 NOTE — Patient Instructions (Signed)
We can continue Xolair and current meds  Order- labs to test antibodies for antibiotic allergy  Please call as needed

## 2017-08-10 NOTE — Progress Notes (Signed)
Subjective:    Patient ID: Debra Byrd, female    DOB: 06/02/1946, 71 y.o.   MRN: [id number]  HPI Female never smoker followed for chronic obstructive asthma/Xolair/often steroid dependent, complicated by allergic rhinitis, peripheral neuropathy, glaucoma,  Offce Spirometry 08/08/2016-NL. FVC 2.04/77%, FEV1 1.64/81%, FEV1/FVC 0.80  ---------------------------------------------------  02/08/17- 71 year old female never smoker followed for chronic obstructive asthma/Xolair/often steroid dependent, complicated by allergic rhinitis, peripheral neuropathy, glaucoma,  6 month follow up for asthma. States her breathing has been doing well. Even with the recent heatwave. Denies any SOB or chest pains today.  She continues to feel asthma control has been very much better since she has been on Xolair monthly injections. No recent exacerbation. Rare need now for rescue inhaler or nebulizer. Post-common trigger now is occasional cold. 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  08/10/17- 71 year old female never smoker followed for chronic obstructive asthma/Xolair/often steroid dependent, complicated by allergic rhinitis, peripheral neuropathy, glaucoma,  ---Asthma; Pt states she is recently getting over sinus infection. Otherwise doing well Recently treated sinusitis with Biaxin/prednisone, ending today-much better.  Denies chest discomfort or wheeze.  Continues to believe Xolair really has helped her.  Still using Advair 100.  Rare need for rescue inhaler. She asked if we could clarify antibiotic intolerance history.  She says penicillin caused edema of arm many years ago.  Associated Z-Pak with rash on thighs.  ROS-see HPI-+ = positive Constitutional:   No-   weight loss, night sweats, fevers, chills, +fatigue, lassitude. HEENT:   No-   difficulty swallowing, tooth/dental problems, sore throat,       No-  sneezing, itching, ear ache, + nasal  congestion, +post nasal drip,  CV:  + Noncardiac chest pain, no- orthopnea, PND, swelling in lower extremities,                                            anasarca, dizziness, palpitations Resp:+shortness of breath with exertion or at rest.               productive cough,   non-productive cough,  No- coughing up of blood.              No-   change in color of mucus.  +Little wheezing.   Skin: No-   rash or lesions. GI:  No-   heartburn, indigestion, abdominal pain, nausea, vomiting,  GU: pain. MS:  No-   joint pain or swelling.   Neuro-     nothing unusualk pain. Psych:  No- change in mood or affect. No depression or anxiety.  No memory loss.   Objective:   Physical Exam              General- Alert, Oriented, Affect-appropriate, Distress- none acute; + overweight, NAD Skin- rash-none, lesions- none, excoriation- none Lymphadenopathy- none Head- atraumatic            Eyes- Gross vision intact, PERRLA, conjunctivae clear secretions. Chronic periorbital edema.            Ears- Hearing, canals-normal            Nose- Clear, no-Septal dev, mucus, polyps, erosion, perforation             Throat- Mallampati II-III , mucosa clear , drainage- none, tonsils- atrophic Neck- flexible , trachea midline, no stridor , thyroid nl, carotid no bruit Chest -  symmetrical excursion , unlabored           Heart/CV- RRR , no murmur , no gallop  , no rub, nl s1 s2                           - JVD- none , edema- none, stasis changes- none, varices- none           Lung- clear, unlabored, dullness-none, rub- none           Chest wall-  Abd-  Br/ Gen/ Rectal- Not done, not indicated Extrem- cyanosis- none, clubbing, none, atrophy- none, strength- nl Neuro- grossly intact to observation

## 2017-08-16 ENCOUNTER — Ambulatory Visit (INDEPENDENT_AMBULATORY_CARE_PROVIDER_SITE_OTHER): Payer: Medicare Other

## 2017-08-16 DIAGNOSIS — J4540 Moderate persistent asthma, uncomplicated: Secondary | ICD-10-CM | POA: Diagnosis not present

## 2017-08-17 LAB — ALLERGEN PENICILLIN G IGE
CLASS: 0
PENICILLIN G IGE ALLERGEN: <0.1 kU/L

## 2017-08-17 LAB — ALLERGEN PENICILLIN V (MINOR): Allergen Penicillin V (Minor): 0.1 kU/L — ABNORMAL HIGH

## 2017-08-17 LAB — ALLERGEN AMOXICILLIN
Amoxicillin IgE Class: 0
Amoxicillin IgE kU/L: <0.1 kU/L

## 2017-08-17 MED ORDER — OMALIZUMAB 150 MG ~~LOC~~ SOLR
150.0000 mg | Freq: Once | SUBCUTANEOUS | Status: AC
  Administered 2017-08-16: 150 mg via SUBCUTANEOUS

## 2017-08-24 DIAGNOSIS — Z881 Allergy status to other antibiotic agents status: Secondary | ICD-10-CM

## 2017-08-24 HISTORY — DX: Allergy status to other antibiotic agents: Z88.1

## 2017-08-24 NOTE — Assessment & Plan Note (Signed)
Without recent complication despite her sinus infection.  Current meds are working well, particularly Xolair.

## 2017-08-24 NOTE — Assessment & Plan Note (Signed)
Hopefully she is not really allergic to some medicines that she had minor side effects with in the past. Plan-available blood test for IgE allergies to penicillin and amoxicillin

## 2017-08-31 ENCOUNTER — Telehealth: Payer: Self-pay | Admitting: Internal Medicine

## 2017-09-01 NOTE — Telephone Encounter (Addendum)
#   PFS:1 Ordered date:08/31/17 Shipping Date:08/31/17 I thought I put the order in yesterday with the others.

## 2017-09-01 NOTE — Telephone Encounter (Signed)
#   PFS:1 Arrival Date:08/31/17 Lot #[id number] Exp Date:07/2018

## 2017-09-13 ENCOUNTER — Ambulatory Visit (INDEPENDENT_AMBULATORY_CARE_PROVIDER_SITE_OTHER): Payer: Medicare Other

## 2017-09-13 DIAGNOSIS — J4540 Moderate persistent asthma, uncomplicated: Secondary | ICD-10-CM | POA: Diagnosis not present

## 2017-09-15 MED ORDER — OMALIZUMAB 150 MG ~~LOC~~ SOLR
150.0000 mg | SUBCUTANEOUS | Status: DC
  Administered 2017-09-13: 150 mg via SUBCUTANEOUS

## 2017-09-15 NOTE — Progress Notes (Signed)
Xolair injection documentation and charges entered by Andreah Goheen, RMA, based on injection sheet filled out by Tammy Scott per office protocol.   

## 2017-09-20 ENCOUNTER — Telehealth: Payer: Self-pay | Admitting: Cardiology

## 2017-09-20 ENCOUNTER — Other Ambulatory Visit: Payer: Self-pay

## 2017-09-20 MED ORDER — ROSUVASTATIN CALCIUM 5 MG PO TABS: 5.0000 mg | ORAL_TABLET | Freq: Every day | ORAL | 3 refills | Status: DC

## 2017-09-20 NOTE — Telephone Encounter (Signed)
Refill sent.

## 2017-09-20 NOTE — Telephone Encounter (Signed)
°*  STAT* If patient is at the pharmacy, call can be transferred to refill team.   1. Which medications need to be refilled? (please list name of each medication and dose if known) Generic Crestor 5mg    2. Which pharmacy/location (including street and city if local pharmacy) is medication to be sent to? [facility] [place]  3. Do they need a 30 day or 90 day supply? 90  Patient saw [name] last time [name]

## 2017-10-02 ENCOUNTER — Other Ambulatory Visit: Payer: Self-pay

## 2017-10-02 ENCOUNTER — Telehealth: Payer: Self-pay | Admitting: Cardiology

## 2017-10-02 MED ORDER — CLOPIDOGREL BISULFATE 75 MG PO TABS: 75.0000 mg | ORAL_TABLET | Freq: Every day | ORAL | 1 refills | Status: DC

## 2017-10-02 NOTE — Telephone Encounter (Signed)
Refill was sent

## 2017-10-02 NOTE — Telephone Encounter (Signed)
Patient states her clopidogrel was denied. Can she be called to say why and refilled?

## 2017-10-11 ENCOUNTER — Ambulatory Visit (INDEPENDENT_AMBULATORY_CARE_PROVIDER_SITE_OTHER): Payer: Medicare Other

## 2017-10-11 DIAGNOSIS — J4540 Moderate persistent asthma, uncomplicated: Secondary | ICD-10-CM

## 2017-10-12 MED ORDER — OMALIZUMAB 150 MG ~~LOC~~ SOLR
150.0000 mg | SUBCUTANEOUS | Status: DC
  Administered 2017-10-04: 150 mg via SUBCUTANEOUS

## 2017-10-12 NOTE — Progress Notes (Signed)
Xolair injection documentation and charges entered by Ashley Caulfield, RMA, based on injection sheet filled out by Tammy Scott per office protocol.   

## 2017-11-03 ENCOUNTER — Telehealth: Payer: Self-pay | Admitting: Internal Medicine

## 2017-11-03 NOTE — Telephone Encounter (Signed)
Prefilled Syringe: #150mg 1  #75mg 0 Ordered Date: 3.15.19 Shipping Date: 3.18.19  

## 2017-11-06 NOTE — Telephone Encounter (Signed)
Prefilled Syringes: # 150mg 1  #75mg 0 Arrival Date: 11/06/2017 Lot #: [id number] [id number]      75mg 0 Exp Date: 150mg 07/2018   75mg 0  

## 2017-11-08 ENCOUNTER — Ambulatory Visit (INDEPENDENT_AMBULATORY_CARE_PROVIDER_SITE_OTHER): Payer: Medicare Other

## 2017-11-08 ENCOUNTER — Ambulatory Visit: Payer: Medicare Other

## 2017-11-08 DIAGNOSIS — J4540 Moderate persistent asthma, uncomplicated: Secondary | ICD-10-CM | POA: Diagnosis not present

## 2017-11-09 MED ORDER — OMALIZUMAB 150 MG ~~LOC~~ SOLR
150.0000 mg | SUBCUTANEOUS | Status: DC
  Administered 2017-11-08: 150 mg via SUBCUTANEOUS

## 2017-11-09 NOTE — Progress Notes (Signed)
Xolair injection documentation and charges entered by Ashley Caulfield, RMA, based on injection sheet filled out by Tammy Scott during preparation and administration. This documentation process is due to office requirements.   

## 2017-11-29 ENCOUNTER — Telehealth: Payer: Self-pay

## 2017-11-29 NOTE — Telephone Encounter (Signed)
#   vials:1-150mg prefilled syringe Ordered date:11/29/17  Shipping Date:11/30/17   

## 2017-11-30 NOTE — Telephone Encounter (Signed)
Prefilled Syringes: # 150mg 1  #75mg 0 Arrival Date: 4.11.19 Lot #: 150mg [id number]      75mg Exp Date: 150mg 12.31.19   75mg    

## 2017-12-06 ENCOUNTER — Ambulatory Visit (INDEPENDENT_AMBULATORY_CARE_PROVIDER_SITE_OTHER): Payer: Medicare Other

## 2017-12-06 ENCOUNTER — Ambulatory Visit: Payer: Medicare Other

## 2017-12-06 DIAGNOSIS — J4540 Moderate persistent asthma, uncomplicated: Secondary | ICD-10-CM | POA: Diagnosis not present

## 2017-12-07 MED ORDER — OMALIZUMAB 150 MG ~~LOC~~ SOLR
150.0000 mg | Freq: Once | SUBCUTANEOUS | Status: AC
  Administered 2017-12-06: 150 mg via SUBCUTANEOUS

## 2017-12-12 ENCOUNTER — Telehealth: Payer: Self-pay | Admitting: Cardiology

## 2017-12-12 ENCOUNTER — Other Ambulatory Visit: Payer: Self-pay

## 2017-12-12 MED ORDER — ROSUVASTATIN CALCIUM 5 MG PO TABS: 5.0000 mg | ORAL_TABLET | Freq: Every day | ORAL | 1 refills | Status: DC

## 2017-12-12 NOTE — Telephone Encounter (Signed)
Refill for Crestor 5 mg daily [facility] [place] per patient request.

## 2017-12-12 NOTE — Telephone Encounter (Signed)
Please call about medications

## 2017-12-13 ENCOUNTER — Telehealth: Payer: Self-pay | Admitting: Internal Medicine

## 2017-12-13 NOTE — Telephone Encounter (Signed)
Prefilled Syringe: #150mg 1  #75mg 0 Ordered Date: 4.24.19 Shipping Date: 4.25.19  

## 2017-12-14 NOTE — Telephone Encounter (Signed)
Prefilled Syringes: # 150mg 1  #75mg 0 Arrival Date: 12/14/2017 Lot #: 150mg [id number]      75mg 0 Exp Date: 150mg 07/2018   75mg 0  

## 2018-01-03 ENCOUNTER — Ambulatory Visit (INDEPENDENT_AMBULATORY_CARE_PROVIDER_SITE_OTHER): Payer: Medicare Other

## 2018-01-03 DIAGNOSIS — J4540 Moderate persistent asthma, uncomplicated: Secondary | ICD-10-CM

## 2018-01-03 MED ORDER — OMALIZUMAB 150 MG ~~LOC~~ SOLR
150.0000 mg | Freq: Once | SUBCUTANEOUS | Status: AC
  Administered 2018-01-03: 150 mg via SUBCUTANEOUS

## 2018-01-09 DIAGNOSIS — R112 Nausea with vomiting, unspecified: Secondary | ICD-10-CM

## 2018-01-09 DIAGNOSIS — R195 Other fecal abnormalities: Secondary | ICD-10-CM | POA: Insufficient documentation

## 2018-01-09 HISTORY — DX: Other fecal abnormalities: R19.5

## 2018-01-09 HISTORY — DX: Nausea with vomiting, unspecified: R11.2

## 2018-01-09 NOTE — Progress Notes (Deleted)
Cardiology Office Note:    Date:  01/09/2018   ID:  Debra Byrd, DOB [birth year], MRN [id number]  PCP:  Irena Reichmann, DO  Cardiologist:  Norman Herrlich, MD    Referring MD: Irena Reichmann, DO    ASSESSMENT:    No diagnosis found. PLAN:    In order of problems listed above:  1. ***   Next appointment: ***   Medication Adjustments/Labs and Tests Ordered: Current medicines are reviewed at length with the patient today.  Concerns regarding medicines are outlined above.  No orders of the defined types were placed in this encounter.  No orders of the defined types were placed in this encounter.   No chief complaint on file.   History of Present Illness:    Debra Byrd is a 72 y.o. female with a hx of Asthma, Right ICA stenosis, Dyslipidemia, HTN, mild MR and Palpitation  last seen 12/23/16. Diagnoses and all orders for this visit: Stenosis of right carotid artery, stable, recheck MRA Oct 2019 stable on serial MRs tenia medical treatment with a high intensity statin and Plavix Essential hypertension stable continue current treatment with ARB Hyperlipidemia, unspecified hyperlipidemia type stable lipids are ideal continue high intensity statin Palpitation stable no recent occurrence Non-rheumatic mitral regurgitation, stable, no murmur on exam  Compliance with diet, lifestyle and medications: *** Past Medical History:  Diagnosis Date  . Allergic rhinitis, cause unspecified   . Asthma   . Chronic airway obstruction, not elsewhere classified   . Coronary atherosclerosis of unspecified type of vessel, native or graft   . Mitral valve disorders(424.0)   . Mononeuritis of unspecified site   . Myalgia and myositis, unspecified   . Peripheral vascular disease, unspecified (HCC)   . Sleep disturbance, unspecified   . Unspecified glaucoma(365.9)     Past Surgical History:  Procedure Laterality Date  . ROTATOR CUFF REPAIR      Current Medications: No outpatient  medications have been marked as taking for the 01/10/18 encounter (Appointment) with Baldo Daub, MD.   Current Facility-Administered Medications for the 01/10/18 encounter (Appointment) with Baldo Daub, MD  Medication  . omalizumab Geoffry Paradise) injection 150 mg     Allergies:   Azithromycin; Cefaclor; Iodine; Levofloxacin; Montelukast sodium; Penicillins; and Sulfonamide derivatives   Social History   Socioeconomic History  . Marital status: Married    Spouse name: Not on file  . Number of children: Not on file  . Years of education: Not on file  . Highest education level: Not on file  Occupational History  . Not on file  Social Needs  . Financial resource strain: Not on file  . Food insecurity:    Worry: Not on file    Inability: Not on file  . Transportation needs:    Medical: Not on file    Non-medical: Not on file  Tobacco Use  . Smoking status: Never Smoker  . Smokeless tobacco: Never Used  Substance and Sexual Activity  . Alcohol use: No  . Drug use: No  . Sexual activity: Not on file  Lifestyle  . Physical activity:    Days per week: Not on file    Minutes per session: Not on file  . Stress: Not on file  Relationships  . Social connections:    Talks on phone: Not on file    Gets together: Not on file    Attends religious service: Not on file    Active member of club or organization: Not on  file    Attends meetings of clubs or organizations: Not on file    Relationship status: Not on file  Other Topics Concern  . Not on file  Social History Narrative  . Not on file     Family History: The patient's ***family history includes Emphysema in her maternal uncle; Heart disease in her father. ROS:   Please see the history of present illness.    All other systems reviewed and are negative.  EKGs/Labs/Other Studies Reviewed:    The following studies were reviewed today:  EKG:  EKG ordered today.  The ekg ordered today demonstrates ***  Recent  Labs: No results found for requested labs within last 8760 hours.  Recent Lipid Panel    Component Value Date/Time   CHOL 131 07/04/2017 1211   TRIG 60 07/04/2017 1211   HDL 62 07/04/2017 1211   CHOLHDL 2.1 07/04/2017 1211   LDLCALC 57 07/04/2017 1211    Physical Exam:    VS:  There were no vitals taken for this visit.    Wt Readings from Last 3 Encounters:  08/10/17 167 lb (75.8 kg)  07/04/17 164 lb (74.4 kg)  02/08/17 165 lb 6.4 oz (75 kg)     GEN: *** Well nourished, well developed in no acute distress HEENT: Normal NECK: No JVD; No carotid bruits LYMPHATICS: No lymphadenopathy CARDIAC: ***RRR, no murmurs, rubs, gallops RESPIRATORY:  Clear to auscultation without rales, wheezing or rhonchi  ABDOMEN: Soft, non-tender, non-distended MUSCULOSKELETAL:  No edema; No deformity  SKIN: Warm and dry NEUROLOGIC:  Alert and oriented x 3 PSYCHIATRIC:  Normal affect    Signed, Norman Herrlich, MD  01/09/2018 11:56 AM    [facility]

## 2018-01-10 ENCOUNTER — Ambulatory Visit: Payer: Medicare Other | Admitting: Cardiology

## 2018-01-17 ENCOUNTER — Telehealth: Payer: Self-pay | Admitting: Internal Medicine

## 2018-01-17 NOTE — Progress Notes (Signed)
Cardiology Office Note:    Date:  01/19/2018   ID:  Debra Byrd, DOB 1945-09-26, MRN [id number]  PCP:  Irena Reichmann, DO  Cardiologist:  Norman Herrlich, MD    Referring MD: Irena Reichmann, DO  Please do a lipid profile at her wellness program   ASSESSMENT:    1. Stenosis of right carotid artery   2. Essential hypertension   3. Mixed hyperlipidemia   4. Palpitation    PLAN:    In order of problems listed above:  1. Asymptomatic stabilized by exam plan follow-up MRA at 18 months November continue treatment including pitter grill and high intensity statin 2. Stable blood pressure target continue current treatment including ARB 3. Stable continue current high intensity statin for carotid stenosis 4. Improved no recent episodes I do not think there is any indication for heart rate monitor at this time   Next appointment: 6 months   Medication Adjustments/Labs and Tests Ordered: Current medicines are reviewed at length with the patient today.  Concerns regarding medicines are outlined above.  No orders of the defined types were placed in this encounter.  No orders of the defined types were placed in this encounter.   Chief Complaint  Patient presents with  . Follow-up    History of Present Illness:    Debra Byrd is a 72 y.o. female with a hx of Asthma, Right ICA stenosis, Dyslipidemia, HTN, mild MR and Palpitation last seen 01/02/17.  Diagnoses and all orders for that visit: Stenosis of right carotid artery, stable, recheck MRA Oct 2019 stable on serial MRs tenia medical treatment with a high intensity statin and Plavix Essential hypertension stable continue current treatment with ARB Hyperlipidemia, unspecified hyperlipidemia type stable lipids are ideal continue high intensity statin Palpitation stable no recent occurrence Non-rheumatic mitral regurgitation, stable, no murmur on exam  Compliance with diet, lifestyle and medications: Yes  She is improved is  pleased with the quality of life has not had TIA palpitation chest pain shortness of breath or syncope.  In particular her asthma has been quite stable. Past Medical History:  Diagnosis Date  . Allergic rhinitis, cause unspecified   . Asthma   . Chronic airway obstruction, not elsewhere classified   . Coronary atherosclerosis of unspecified type of vessel, native or graft   . Mitral valve disorders(424.0)   . Mononeuritis of unspecified site   . Myalgia and myositis, unspecified   . Peripheral vascular disease, unspecified (HCC)   . Sleep disturbance, unspecified   . Unspecified glaucoma(365.9)     Past Surgical History:  Procedure Laterality Date  . ROTATOR CUFF REPAIR      Current Medications: Current Meds  Medication Sig  . albuterol (PROAIR HFA) 108 (90 Base) MCG/ACT inhaler Inhale 2 puffs into the lungs every 6 (six) hours as needed for wheezing.  Marland Kitchen albuterol (PROVENTIL) (2.5 MG/3ML) 0.083% nebulizer solution Take 3 mLs (2.5 mg total) by nebulization 4 (four) times daily as needed for wheezing.  . brimonidine (ALPHAGAN) 0.15 % ophthalmic solution Place 1 drop into both eyes 2 (two) times daily.  . calcium gluconate 500 MG tablet Take 500 mg by mouth 2 (two) times daily.    . cholecalciferol (VITAMIN D) 1000 UNITS tablet Take 1,000 Units by mouth daily.  . clopidogrel (PLAVIX) 75 MG tablet Take 1 tablet (75 mg total) by mouth daily.  . Coenzyme Q10 (COQ10) 100 MG CAPS Take 1 capsule by mouth daily.  Marland Kitchen dexlansoprazole (DEXILANT) 60 MG capsule Take 60  mg by mouth daily.    . fish oil-omega-3 fatty acids 1000 MG capsule 4 by mouth daily   . fluticasone (FLONASE) 50 MCG/ACT nasal spray Place 2 sprays into both nostrils daily.  . Fluticasone-Salmeterol (ADVAIR DISKUS) 100-50 MCG/DOSE AEPB Inhale 1 puff and rinse mouth, twice daily  . gabapentin (NEURONTIN) 300 MG capsule Take 300 mg by mouth at bedtime.  Marland Kitchen latanoprost (XALATAN) 0.005 % ophthalmic solution Place 1 drop into both eyes  at bedtime.  Marland Kitchen loratadine (CLARITIN) 10 MG tablet Take 1 tablet (10 mg total) by mouth daily.  Marland Kitchen losartan (COZAAR) 50 MG tablet Take 50 mg by mouth daily.    Marland Kitchen omalizumab (XOLAIR) 150 MG injection Inject 150 mg into the skin every 14 (fourteen) days.  . pyridoxine (B-6) 100 MG tablet Take 100 mg by mouth daily.    . rosuvastatin (CRESTOR) 5 MG tablet Take 1 tablet (5 mg total) by mouth daily.  . vitamin B-12 (CYANOCOBALAMIN) 1000 MCG tablet Take 1,000 mcg by mouth daily.   Current Facility-Administered Medications for the 01/19/18 encounter (Office Visit) with Baldo Daub, MD  Medication  . omalizumab Geoffry Paradise) injection 150 mg     Allergies:   Azithromycin; Cefaclor; Iodine; Levofloxacin; Montelukast sodium; Penicillins; Sulfonamide derivatives; and Sulfa antibiotics   Social History   Socioeconomic History  . Marital status: Married    Spouse name: Not on file  . Number of children: Not on file  . Years of education: Not on file  . Highest education level: Not on file  Occupational History  . Not on file  Social Needs  . Financial resource strain: Not on file  . Food insecurity:    Worry: Not on file    Inability: Not on file  . Transportation needs:    Medical: Not on file    Non-medical: Not on file  Tobacco Use  . Smoking status: Never Smoker  . Smokeless tobacco: Never Used  Substance and Sexual Activity  . Alcohol use: No  . Drug use: No  . Sexual activity: Not on file  Lifestyle  . Physical activity:    Days per week: Not on file    Minutes per session: Not on file  . Stress: Not on file  Relationships  . Social connections:    Talks on phone: Not on file    Gets together: Not on file    Attends religious service: Not on file    Active member of club or organization: Not on file    Attends meetings of clubs or organizations: Not on file    Relationship status: Not on file  Other Topics Concern  . Not on file  Social History Narrative  . Not on file      Family History: The patient's family history includes Emphysema in her maternal uncle; Heart disease in her father. ROS:   Please see the history of present illness.    All other systems reviewed and are negative.  EKGs/Labs/Other Studies Reviewed:    The following studies were reviewed today:    Recent Labs: No results found for requested labs within last 8760 hours.  Recent Lipid Panel    Component Value Date/Time   CHOL 131 07/04/2017 1211   TRIG 60 07/04/2017 1211   HDL 62 07/04/2017 1211   CHOLHDL 2.1 07/04/2017 1211   LDLCALC 57 07/04/2017 1211    Physical Exam:    VS:  BP 130/74 (BP Location: Right Arm, Patient Position: Sitting, Cuff Size: Normal)  Pulse 62   Ht  (1.549 m)   Wt 164 lb (74.4 kg)   SpO2 98%   BMI 30.99 kg/m     Wt Readings from Last 3 Encounters:  01/19/18 164 lb (74.4 kg)  08/10/17 167 lb (75.8 kg)  07/04/17 164 lb (74.4 kg)     GEN:  Well nourished, well developed in no acute distress HEENT: Normal NECK: No JVD; No carotid bruits LYMPHATICS: No lymphadenopathy CARDIAC: RRR, no murmurs, rubs, gallops RESPIRATORY:  Clear to auscultation without rales, wheezing or rhonchi  ABDOMEN: Soft, non-tender, non-distended MUSCULOSKELETAL:  No edema; No deformity  SKIN: Warm and dry NEUROLOGIC:  Alert and oriented x 3 PSYCHIATRIC:  Normal affect    Signed, Norman Herrlich, MD  01/19/2018 4:08 PM    [facility]

## 2018-01-17 NOTE — Telephone Encounter (Signed)
Prefilled Syringe: #150mg  1  #75mg  0 Ordered Date: 01/17/18 Shipping Date: 01/17/18

## 2018-01-18 NOTE — Telephone Encounter (Signed)
Prefilled Syringes: #  1  #75mg  0 Arrival Date: 01/18/18 Lot #:  [id number]       0 Exp Date:  07/2018    0

## 2018-01-19 ENCOUNTER — Encounter: Payer: Self-pay | Admitting: Cardiology

## 2018-01-19 ENCOUNTER — Ambulatory Visit (INDEPENDENT_AMBULATORY_CARE_PROVIDER_SITE_OTHER): Payer: Medicare Other | Admitting: Cardiology

## 2018-01-19 VITALS — BP 130/74 | HR 62 | Ht 61.0 in | Wt 164.0 lb

## 2018-01-19 DIAGNOSIS — R002 Palpitations: Secondary | ICD-10-CM | POA: Diagnosis not present

## 2018-01-19 DIAGNOSIS — I10 Essential (primary) hypertension: Secondary | ICD-10-CM | POA: Diagnosis not present

## 2018-01-19 DIAGNOSIS — I6521 Occlusion and stenosis of right carotid artery: Secondary | ICD-10-CM | POA: Diagnosis not present

## 2018-01-19 DIAGNOSIS — E782 Mixed hyperlipidemia: Secondary | ICD-10-CM

## 2018-01-19 NOTE — Patient Instructions (Signed)
Medication Instructions:  Your physician recommends that you continue on your current medications as directed. Please refer to the Current Medication list given to you today.   Labwork: NONE  Testing/Procedures:  In November you will [facility] [place] at [facility] at [place].  Please contact them to schedule.    Follow-Up: Your physician wants you to follow-up in: 6 months. You will receive a reminder letter in the mail two months in advance. If you don't receive a letter, please call our office to schedule the follow-up appointment.   Any Other Special Instructions Will Be Listed Below (If Applicable).     If you need a refill on your cardiac medications before your next appointment, please call your pharmacy.

## 2018-01-23 DIAGNOSIS — Z0000 Encounter for general adult medical examination without abnormal findings: Secondary | ICD-10-CM

## 2018-01-23 HISTORY — DX: Encounter for general adult medical examination without abnormal findings: Z00.00

## 2018-01-24 ENCOUNTER — Ambulatory Visit: Payer: Medicare Other

## 2018-01-24 ENCOUNTER — Ambulatory Visit (INDEPENDENT_AMBULATORY_CARE_PROVIDER_SITE_OTHER): Payer: Medicare Other

## 2018-01-24 DIAGNOSIS — J4540 Moderate persistent asthma, uncomplicated: Secondary | ICD-10-CM | POA: Diagnosis not present

## 2018-01-25 MED ORDER — OMALIZUMAB 150 MG ~~LOC~~ SOLR
150.0000 mg | SUBCUTANEOUS | Status: DC
  Administered 2018-01-24: 150 mg via SUBCUTANEOUS

## 2018-01-25 NOTE — Progress Notes (Signed)
Documentation of medication administration and charges of Xolair have been completed by Lindsay Lemons, CMA based on the Xolair documentation sheet completed by Tammy Scott.  

## 2018-02-08 ENCOUNTER — Encounter: Payer: Self-pay | Admitting: Internal Medicine

## 2018-02-08 ENCOUNTER — Ambulatory Visit (INDEPENDENT_AMBULATORY_CARE_PROVIDER_SITE_OTHER): Payer: Medicare Other | Admitting: Internal Medicine

## 2018-02-08 VITALS — BP 110/68 | HR 62 | Ht 61.0 in | Wt 164.4 lb

## 2018-02-08 DIAGNOSIS — J4540 Moderate persistent asthma, uncomplicated: Secondary | ICD-10-CM

## 2018-02-08 DIAGNOSIS — I6521 Occlusion and stenosis of right carotid artery: Secondary | ICD-10-CM | POA: Diagnosis not present

## 2018-02-08 DIAGNOSIS — Z881 Allergy status to other antibiotic agents status: Secondary | ICD-10-CM | POA: Diagnosis not present

## 2018-02-08 MED ORDER — PREDNISONE 10 MG PO TABS: ORAL_TABLET | ORAL | 2 refills | Status: DC

## 2018-02-08 MED ORDER — CLARITHROMYCIN 500 MG PO TABS: ORAL_TABLET | ORAL | 2 refills | Status: DC

## 2018-02-08 NOTE — Progress Notes (Signed)
Subjective:    Patient ID: Debra Byrd, female    DOB: 1946/04/26, 72 y.o.   MRN: [id number]  HPI Female never smoker followed for chronic obstructive asthma/Xolair/often steroid dependent, complicated by allergic rhinitis, peripheral neuropathy, glaucoma,  Offce Spirometry 08/08/2016-NL. FVC 2.04/77%, FEV1 1.64/81%, FEV1/FVC 0.80  -------------------------------------------------- 08/10/17- 72 year old female never smoker followed for chronic obstructive asthma/Xolair/often steroid dependent, complicated by allergic rhinitis, peripheral neuropathy, glaucoma,  ---Asthma; Pt states she is recently getting over sinus infection. Otherwise doing well Recently treated sinusitis with Biaxin/prednisone, ending today-much better.  Denies chest discomfort or wheeze.  Continues to believe Xolair really has helped her.  Still using Advair 100.  Rare need for rescue inhaler. She asked if we could clarify antibiotic intolerance history.  She says penicillin caused edema of arm many years ago.  Associated Z-Pak with rash on thighs.  02/08/2018- 72 year old female never smoker followed for chronic obstructive asthma/Xolair/ often steroid dependent, complicated by allergic rhinitis, peripheral neuropathy, glaucoma,  -----Asthma: Pt continues Xolair injections and has had only 1 flare up recently.  Advair 100, pro-air HFA, Xolair Needed rescue inhaler one occasion at church but otherwise continues to do very well and she credits Xolair for sustained significant improvement. She restarts Advair when she anticipates sustained increased risk. We reviewed labs from penicillin allergy survey noting minimal elevation only against Pen-Vee/minor antigen.  I told her she was probably safe to take penicillin medications under supervision.  I cannot exclude the possibility of an an amnestic response to discuss this with her.  ROS-see HPI-+ = positive Constitutional:   No-   weight loss, night sweats, fevers, chills,  +fatigue, lassitude. HEENT:   No-   difficulty swallowing, tooth/dental problems, sore throat,       No-  sneezing, itching, ear ache,  nasal congestion, post nasal drip,  CV:  + Noncardiac chest pain, no- orthopnea, PND, swelling in lower extremities,                                          anasarca, dizziness, palpitations Resp:+shortness of breath with exertion or at rest.               productive cough,   non-productive cough,  No- coughing up of blood.              No-   change in color of mucus.  +Little wheezing.   Skin: No-   rash or lesions. GI:  No-   heartburn, indigestion, abdominal pain, nausea, vomiting,  GU: pain. MS:  +joint pain or swelling.   Neuro-     nothing unusualk pain. Psych:  No- change in mood or affect. No depression or anxiety.  No memory loss.   Objective:   Physical Exam              General- Alert, Oriented, Affect-appropriate, Distress- none acute; + overweight,  Skin- rash-none, lesions- none, excoriation- none Lymphadenopathy- none Head- atraumatic            Eyes- Gross vision intact, PERRLA, conjunctivae clear secretions. Chronic periorbital edema.            Ears- Hearing, canals-normal            Nose- Clear, no-Septal dev, mucus, polyps, erosion, perforation             Throat- Mallampati II-III , mucosa clear , drainage- none, tonsils-  atrophic Neck- flexible , trachea midline, no stridor , thyroid nl, carotid no bruit Chest - symmetrical excursion , unlabored           Heart/CV- RRR , no murmur , no gallop  , no rub, nl s1 s2                           - JVD- none , edema- none, stasis changes- none, varices- none           Lung- clear, unlabored, dullness-none, rub- none           Chest wall-  Abd-  Br/ Gen/ Rectal- Not done, not indicated Extrem- cyanosis- none, clubbing, none, atrophy- none, strength- nl Neuro- grossly intact to observation

## 2018-02-08 NOTE — Assessment & Plan Note (Signed)
She had a rash possibly associated with Zithromax but has subsequently tolerated Biaxin. Minimal and probably insignificant penicillin antibody elevation against Pen-Vee is reassuring but does not promise she would never have a reaction to penicillins.  If necessary they can be used under supervision.  She is comfortable with this understanding.

## 2018-02-08 NOTE — Patient Instructions (Signed)
Ok to continue current meds. Really glad Debra Byrd been doing so well.  Please call if we can help

## 2018-02-08 NOTE — Assessment & Plan Note (Signed)
She continues to do very well with no changes needed.  Xolair is definitely helping. Plan-continue Xolair.  Refill inhalers when needed.  Standby prescriptions for Biaxin and prednisone taper to keep available for travel.

## 2018-02-14 ENCOUNTER — Telehealth: Payer: Self-pay | Admitting: Internal Medicine

## 2018-02-15 NOTE — Telephone Encounter (Signed)
Prefilled Syringe: #150mg  1  #75mg  0 Ordered Date: 02/14/18 Shipping Date: 02/14/18

## 2018-02-15 NOTE — Telephone Encounter (Signed)
Prefilled Syringes: # 150mg  1  #75mg  0 Arrival Date: 02/15/18 Lot #: 150mg  [id number]      75mg  0 Exp Date: 150mg  08/2018   75mg  0

## 2018-02-21 ENCOUNTER — Ambulatory Visit (INDEPENDENT_AMBULATORY_CARE_PROVIDER_SITE_OTHER): Payer: Medicare Other

## 2018-02-21 DIAGNOSIS — J4540 Moderate persistent asthma, uncomplicated: Secondary | ICD-10-CM | POA: Diagnosis not present

## 2018-02-23 MED ORDER — OMALIZUMAB 150 MG ~~LOC~~ SOLR
150.0000 mg | Freq: Once | SUBCUTANEOUS | Status: AC
  Administered 2018-02-21: 150 mg via SUBCUTANEOUS

## 2018-03-13 ENCOUNTER — Telehealth: Payer: Self-pay | Admitting: Internal Medicine

## 2018-03-14 ENCOUNTER — Telehealth: Payer: Self-pay | Admitting: Internal Medicine

## 2018-03-14 NOTE — Telephone Encounter (Signed)
Prefilled Syringe: #150mg 1  #75mg 0 Ordered Date: 03/13/18 Shipping Date: 03/13/18  

## 2018-03-14 NOTE — Telephone Encounter (Signed)
Error

## 2018-03-14 NOTE — Telephone Encounter (Signed)
Prefilled Syringes: # 150mg 1  #75mg 0 Arrival Date: 03/14/18 Lot #: 150mg [id number]      75mg 0 Exp Date: 150mg 08/2018   75mg 0  

## 2018-03-16 ENCOUNTER — Telehealth: Payer: Self-pay | Admitting: Internal Medicine

## 2018-03-16 NOTE — Telephone Encounter (Signed)
Spoke with patient-she wants to pick up Rx's next week. Has Injection scheduled for Wednesday next week. Pt is aware that CY is back in the clinic on Monday and will sign then.

## 2018-03-20 MED ORDER — FLUTICASONE-SALMETEROL 100-50 MCG/DOSE IN AEPB: INHALATION_SPRAY | RESPIRATORY_TRACT | 3 refills | Status: DC

## 2018-03-20 MED ORDER — EPINEPHRINE 0.3 MG/0.3ML IJ SOAJ: 0.3000 mg | Freq: Once | INTRAMUSCULAR | 11 refills | Status: AC

## 2018-03-20 MED ORDER — ALBUTEROL SULFATE HFA 108 (90 BASE) MCG/ACT IN AERS: 2.0000 | INHALATION_SPRAY | Freq: Four times a day (QID) | RESPIRATORY_TRACT | 3 refills | Status: DC | PRN

## 2018-03-20 MED ORDER — LORATADINE 10 MG PO TABS: 10.0000 mg | ORAL_TABLET | Freq: Every day | ORAL | 3 refills | Status: DC

## 2018-03-20 MED ORDER — FLUTICASONE PROPIONATE 50 MCG/ACT NA SUSP: 2.0000 | Freq: Every day | NASAL | 3 refills | Status: DC

## 2018-03-20 NOTE — Telephone Encounter (Signed)
Rx's have been printed, signed by CY, and placed at front for pick up. Nothing more needed at this time.

## 2018-03-21 ENCOUNTER — Ambulatory Visit: Payer: Medicare Other

## 2018-03-27 ENCOUNTER — Telehealth: Payer: Self-pay | Admitting: *Deleted

## 2018-03-27 NOTE — Telephone Encounter (Signed)
Called pt back as requested, pt went ahead and made an appt to see CY.Pt saw her primary for a sinus problem last wk.. Pt is still coughing and more so at night.  I reiterated that was the right thing to do. Then if CY says she can have her shot, she'll come and see me.

## 2018-03-28 ENCOUNTER — Ambulatory Visit (INDEPENDENT_AMBULATORY_CARE_PROVIDER_SITE_OTHER)
Admission: RE | Admit: 2018-03-28 | Discharge: 2018-03-28 | Disposition: A | Discharge status: Home / Self Care | Payer: Medicare Other | Source: Ambulatory Visit | Attending: Internal Medicine | Admitting: Internal Medicine

## 2018-03-28 ENCOUNTER — Encounter: Payer: Self-pay | Admitting: Internal Medicine

## 2018-03-28 ENCOUNTER — Ambulatory Visit: Payer: Medicare Other

## 2018-03-28 ENCOUNTER — Ambulatory Visit (INDEPENDENT_AMBULATORY_CARE_PROVIDER_SITE_OTHER): Payer: Medicare Other | Admitting: Internal Medicine

## 2018-03-28 VITALS — BP 114/72 | HR 52 | Ht 61.0 in | Wt 161.6 lb

## 2018-03-28 DIAGNOSIS — J3089 Other allergic rhinitis: Secondary | ICD-10-CM | POA: Diagnosis not present

## 2018-03-28 DIAGNOSIS — J209 Acute bronchitis, unspecified: Secondary | ICD-10-CM

## 2018-03-28 DIAGNOSIS — J302 Other seasonal allergic rhinitis: Secondary | ICD-10-CM

## 2018-03-28 DIAGNOSIS — J4541 Moderate persistent asthma with (acute) exacerbation: Secondary | ICD-10-CM | POA: Diagnosis not present

## 2018-03-28 DIAGNOSIS — I6521 Occlusion and stenosis of right carotid artery: Secondary | ICD-10-CM

## 2018-03-28 MED ORDER — BENZONATATE 200 MG PO CAPS: 200.0000 mg | ORAL_CAPSULE | Freq: Three times a day (TID) | ORAL | 1 refills | Status: DC | PRN

## 2018-03-28 MED ORDER — CLARITHROMYCIN 500 MG PO TABS
ORAL_TABLET | ORAL | 2 refills | Status: DC
Start: 2018-03-28 — End: 2018-08-08

## 2018-03-28 MED ORDER — PREDNISONE 10 MG PO TABS: ORAL_TABLET | ORAL | 0 refills | Status: DC

## 2018-03-28 NOTE — Progress Notes (Signed)
Subjective:    Patient ID: Debra Byrd, female    DOB: [birth year], 72 y.o.   MRN: [id number]  HPI Female never smoker followed for chronic obstructive asthma/Xolair/often steroid dependent, complicated by allergic rhinitis, peripheral neuropathy, glaucoma,  Offce Spirometry 08/08/2016-NL. FVC 2.04/77%, FEV1 1.64/81%, FEV1/FVC 0.80  --------------------------------------------------  02/08/2018- 72 year old female never smoker followed for chronic obstructive asthma/Xolair/ often steroid dependent, complicated by allergic rhinitis, peripheral neuropathy, glaucoma,  -----Asthma: Pt continues Xolair injections and has had only 1 flare up recently.  Advair 100, pro-air HFA, Xolair Needed rescue inhaler one occasion at church but otherwise continues to do very well and she credits Xolair for sustained significant improvement. She restarts Advair when she anticipates sustained increased risk. We reviewed labs from penicillin allergy survey noting minimal elevation only against Pen-Vee/minor antigen.  I told her she was probably safe to take penicillin medications under supervision.  I cannot exclude the possibility of an an amnestic response to discuss this with her.  03/28/2018- 72 year old female never smoker followed for chronic obstructive asthma/Xolair/ often steroid dependent, complicated by allergic rhinitis, peripheral neuropathy, glaucoma,  Advair 100, pro-air HFA, Xolair -----Asthma: had recent sore throat-was put on prednisone burst Rx; cough(worse at night), wheezing, Right side rib cage pain/soreness, sinus drainage, and cough yellow in color.  Acute illness began 11 days ago as sore throat with persistent drainage dry cough which has begun productive yellow.  No fever.  Daughter lives with her and was treated for bronchitis.  Took prednisone and Augmentin from PCP with limited benefit.  Maintenance doxycycline 50 mg for eye problem. This morning tussive right lateral rib cage pain.  Had  to miss Xolair injection last week because of this cough.  Is able to take Biaxin.  ROS-see HPI-+ = positive Constitutional:   No-   weight loss, night sweats, fevers, chills, +fatigue, lassitude. HEENT:   No-   difficulty swallowing, tooth/dental problems, sore throat,       No-  sneezing, itching, ear ache,  nasal congestion, post nasal drip,  CV:  + Noncardiac chest pain, no- orthopnea, PND, swelling in lower extremities,                                          anasarca, dizziness, palpitations Resp:+shortness of breath with exertion or at rest.               +productive cough,   +non-productive cough,  No- coughing up of blood.              +  change in color of mucus.  +Little wheezing.   Skin: No-   rash or lesions. GI:  No-   heartburn, indigestion, abdominal pain, nausea, vomiting,  GU: pain. MS:  +joint pain or swelling.   Neuro-     nothing unusualk pain. Psych:  No- change in mood or affect. No depression or anxiety.  No memory loss.   Objective:   Physical Exam              General- Alert, Oriented, Affect-appropriate, Distress- none acute; + overweight,  Skin- rash-none, lesions- none, excoriation- none Lymphadenopathy- none Head- atraumatic            Eyes- Gross vision intact, PERRLA, conjunctivae clear secretions. Chronic periorbital edema.            Ears- Hearing, canals-normal  Nose- Clear, no-Septal dev, mucus, polyps, erosion, perforation             Throat- Mallampati II-III , mucosa clear , drainage- none, tonsils- atrophic, + hoarse Neck- flexible , trachea midline, no stridor , thyroid nl, carotid no bruit Chest - symmetrical excursion , unlabored           Heart/CV- RRR , no murmur , no gallop  , no rub, nl s1 s2                           - JVD- none , edema- none, stasis changes- none, varices- none           Lung- clear, unlabored, dullness-none, rub- none, + dry paroxysmal wheezy cough           Chest wall-  Abd-  Br/ Gen/ Rectal- Not done,  not indicated Extrem- cyanosis- none, clubbing, none, atrophy- none, strength- nl Neuro- grossly intact to observation

## 2018-03-28 NOTE — Assessment & Plan Note (Addendum)
Acute exacerbation as asthmatic bronchitis that was probably initially viral triggered. Right pleuritic chest pain without rub consistent with tussive chest wall musculoskeletal pain. Plan-Biaxin (she assures me she could tolerate this), CXR, benzonatate Perles, continue routine meds. Neb xop.

## 2018-03-28 NOTE — Telephone Encounter (Signed)
Nothing further needed 

## 2018-03-28 NOTE — Assessment & Plan Note (Signed)
She complains of postnasal drainage but I think this is part of the acute bronchitis syndrome she is dealing with now, and not a seasonal pollen response. Okay to use antihistamine if needed.

## 2018-03-28 NOTE — Patient Instructions (Addendum)
Script sent for Biaxin   Script sent for benzonatate perles for cough  Script for prednisone 10 mg tabs-  1/2 or 1 tab, once or twice daily while needed  Ok to use an otc cough syrup  Order- CXR  Dx acute bronchitis, R lateral pleuritic chest pain  Ok to keep Xolair appointment next week unless you are sicker by then   Keep December appointment

## 2018-03-29 ENCOUNTER — Ambulatory Visit: Payer: Medicare Other

## 2018-03-29 ENCOUNTER — Ambulatory Visit: Payer: Medicare Other | Admitting: Internal Medicine

## 2018-04-02 ENCOUNTER — Other Ambulatory Visit: Payer: Self-pay

## 2018-04-02 ENCOUNTER — Telehealth: Payer: Self-pay | Admitting: Internal Medicine

## 2018-04-02 MED ORDER — CLOPIDOGREL BISULFATE 75 MG PO TABS: 75.0000 mg | ORAL_TABLET | Freq: Every day | ORAL | 0 refills | Status: DC

## 2018-04-02 NOTE — Telephone Encounter (Signed)
Notes recorded by Ronny BaconWelchel, Katie C, CMA on 04/02/2018 at 9:13 AM EDT LMTCB ------  Notes recorded by Waymon BudgeYoung, Clinton D, MD on 03/29/2018 at 1:35 PM EDT CXR- stable scaring with no changes back to 2011. No active process seen. No obvious problmem other that pulled muscles, to explain the right sided chest wall pain.  Called  and spoke with the patient gave result and recommendation she verbalized understanding nothing further needed at this time.

## 2018-04-04 ENCOUNTER — Ambulatory Visit (INDEPENDENT_AMBULATORY_CARE_PROVIDER_SITE_OTHER): Payer: Medicare Other

## 2018-04-04 DIAGNOSIS — J4540 Moderate persistent asthma, uncomplicated: Secondary | ICD-10-CM

## 2018-04-04 MED ORDER — OMALIZUMAB 150 MG ~~LOC~~ SOLR
150.0000 mg | Freq: Once | SUBCUTANEOUS | Status: AC
  Administered 2018-04-04: 150 mg via SUBCUTANEOUS

## 2018-04-26 ENCOUNTER — Telehealth: Payer: Self-pay | Admitting: Internal Medicine

## 2018-04-27 NOTE — Telephone Encounter (Signed)
Prefilled Syringes: # 150mg  1  #75mg  0 Arrival Date: 04/27/18 Lot #: 150mg  [id number]      75mg  0 Exp Date: 150mg  10/2018   75mg  0

## 2018-04-27 NOTE — Telephone Encounter (Signed)
Prefilled Syringe: #150mg  1  #75mg  0 Ordered Date: 04/26/18 Shipping Date: 04/26/18

## 2018-05-02 ENCOUNTER — Ambulatory Visit (INDEPENDENT_AMBULATORY_CARE_PROVIDER_SITE_OTHER): Payer: Medicare Other

## 2018-05-02 ENCOUNTER — Ambulatory Visit: Payer: Medicare Other

## 2018-05-02 DIAGNOSIS — J4540 Moderate persistent asthma, uncomplicated: Secondary | ICD-10-CM | POA: Diagnosis not present

## 2018-05-02 MED ORDER — OMALIZUMAB 150 MG ~~LOC~~ SOLR
150.0000 mg | Freq: Once | SUBCUTANEOUS | Status: AC
  Administered 2018-05-02: 150 mg via SUBCUTANEOUS

## 2018-05-30 ENCOUNTER — Ambulatory Visit (INDEPENDENT_AMBULATORY_CARE_PROVIDER_SITE_OTHER): Payer: Medicare Other

## 2018-05-30 DIAGNOSIS — J4540 Moderate persistent asthma, uncomplicated: Secondary | ICD-10-CM

## 2018-05-30 MED ORDER — OMALIZUMAB 150 MG ~~LOC~~ SOLR
150.0000 mg | Freq: Once | SUBCUTANEOUS | Status: AC
  Administered 2018-05-30: 150 mg via SUBCUTANEOUS

## 2018-06-25 ENCOUNTER — Telehealth: Payer: Self-pay | Admitting: Internal Medicine

## 2018-06-25 NOTE — Telephone Encounter (Signed)
Prefilled Syringe: #150mg 1  #75mg 0 Ordered Date: 06/25/18 Shipping Date: 06/25/18 

## 2018-06-26 NOTE — Telephone Encounter (Signed)
Prefilled Syringes: # 150mg 1  #75mg 0 Arrival Date: 06/26/18 Lot #: 150mg [id number]      75mg 0 Exp Date: 150mg 11/2018   75mg 0  

## 2018-06-27 ENCOUNTER — Ambulatory Visit (INDEPENDENT_AMBULATORY_CARE_PROVIDER_SITE_OTHER): Payer: Medicare Other

## 2018-06-27 DIAGNOSIS — J4540 Moderate persistent asthma, uncomplicated: Secondary | ICD-10-CM

## 2018-06-27 MED ORDER — OMALIZUMAB 150 MG ~~LOC~~ SOLR
150.0000 mg | Freq: Once | SUBCUTANEOUS | Status: AC
  Administered 2018-06-27: 150 mg via SUBCUTANEOUS

## 2018-07-03 ENCOUNTER — Telehealth: Payer: Self-pay

## 2018-07-03 MED ORDER — CLOPIDOGREL BISULFATE 75 MG PO TABS: 75.0000 mg | ORAL_TABLET | Freq: Every day | ORAL | 0 refills | Status: DC

## 2018-07-03 NOTE — Telephone Encounter (Signed)
Rx sent to pharmacy as requested.

## 2018-07-25 ENCOUNTER — Ambulatory Visit: Payer: Medicare Other

## 2018-07-25 ENCOUNTER — Ambulatory Visit (INDEPENDENT_AMBULATORY_CARE_PROVIDER_SITE_OTHER): Payer: Medicare Other

## 2018-07-25 DIAGNOSIS — J4540 Moderate persistent asthma, uncomplicated: Secondary | ICD-10-CM | POA: Diagnosis not present

## 2018-07-25 MED ORDER — OMALIZUMAB 150 MG ~~LOC~~ SOLR
150.0000 mg | Freq: Once | SUBCUTANEOUS | Status: AC
  Administered 2018-07-25: 150 mg via SUBCUTANEOUS

## 2018-07-25 NOTE — Progress Notes (Signed)
Documented by Baneen Wieseler CMA based on hand-written Xolair documentation sheet completed by Tammy Scott CMA, who administered the medication.  

## 2018-08-06 NOTE — Progress Notes (Signed)
Cardiology Office Note:    Date:  08/08/2018   ID:  Debra Byrd, DOB 09/03/1945, MRN [id number]  PCP:  Irena Reichmannollins, Dana, DO  Cardiologist:  Norman HerrlichBrian [name], MD    Referring MD: Irena Reichmannollins, Dana, DO    ASSESSMENT:    1. APC (atrial premature contractions)   2. Non-rheumatic mitral regurgitation   3. Stenosis of right carotid artery   4. Essential hypertension   5. Mixed hyperlipidemia   6. Moderate persistent asthma with acute exacerbation    PLAN:    In order of problems listed above:  1. She is not having palpitation at this time and does not require suppressant treatment. 2. Stable minimal murmur on exam I do not think she needs to repeat an echocardiogram 3. Asymptomatic no bruit she is on good medical therapy with clopidogrel lipid-lowering therapy with high intensity statin and will decide at the time of her next visit if we reimage by MRI 4. Stable we will recheck a lipid profile today along with a liver profile for safety 5. She continues to do exceptionally well overall disease course is been improved and she has infrequent exacerbation and now has a good quality of life 6. Stable home blood pressures consistently less than 130- 140/80 continue current treatment including an ARB  At her request MRA is scheduled about a week before the office visit that way we will have data to review for decision making. Next appointment: 6 months   Medication Adjustments/Labs and Tests Ordered: Current medicines are reviewed at length with the patient today.  Concerns regarding medicines are outlined above.  No orders of the defined types were placed in this encounter.  No orders of the defined types were placed in this encounter.   Chief Complaint  Patient presents with  . Follow-up    APC's  . Palpitations  . Hypertension  . Hyperlipidemia    History of Present Illness:    Debra CraftsLynna L Byrd is a 72 y.o. female with a hx of Asthma, Right ICA stenosis, Dyslipidemia, HTN, mild MR  and Palpitation with APc's last seen 01/19/18. Compliance with diet, lifestyle and medications: Yes  Overall pleased with the quality of her life but is having more joint pain especially caring for her daughter who had orthopedic surgery.  She is not having palpitation edema shortness of breath chest pain syncope or TIA.  She is exceptionally compliant with medications and blood pressure checked at home is consistently at target.  Labs were performed 12-19 renal function potassium normal Past Medical History:  Diagnosis Date  . Allergic rhinitis, cause unspecified   . Asthma   . Chronic airway obstruction, not elsewhere classified   . Coronary atherosclerosis of unspecified type of vessel, native or graft   . Mitral valve disorders(424.0)   . Mononeuritis of unspecified site   . Myalgia and myositis, unspecified   . Peripheral vascular disease, unspecified (HCC)   . Sleep disturbance, unspecified   . Unspecified glaucoma(365.9)     Past Surgical History:  Procedure Laterality Date  . ROTATOR CUFF REPAIR      Current Medications: Current Meds  Medication Sig  . albuterol (PROAIR HFA) 108 (90 Base) MCG/ACT inhaler Inhale 2 puffs into the lungs every 6 (six) hours as needed for wheezing.  Marland Kitchen. albuterol (PROVENTIL) (2.5 MG/3ML) 0.083% nebulizer solution Take 3 mLs (2.5 mg total) by nebulization 4 (four) times daily as needed for wheezing.  . benzonatate (TESSALON) 200 MG capsule Take 1 capsule (200 mg total) by  mouth 3 (three) times daily as needed for cough.  . calcium gluconate 500 MG tablet Take 500 mg by mouth 2 (two) times daily.    . cholecalciferol (VITAMIN D) 1000 UNITS tablet Take 1,000 Units by mouth daily.  . clopidogrel (PLAVIX) 75 MG tablet Take 1 tablet (75 mg total) by mouth daily.  . Coenzyme Q10 (COQ10) 100 MG CAPS Take 1 capsule by mouth daily.  Marland Kitchen dexlansoprazole (DEXILANT) 60 MG capsule Take 60 mg by mouth daily.    Marland Kitchen doxycycline (ADOXA) 50 MG tablet Take 50 mg by mouth  daily.  . fish oil-omega-3 fatty acids 1000 MG capsule 4 by mouth daily   . fluticasone (FLONASE) 50 MCG/ACT nasal spray Place 2 sprays into both nostrils daily.  . Fluticasone-Salmeterol (ADVAIR DISKUS) 100-50 MCG/DOSE AEPB Inhale 1 puff and rinse mouth, twice daily  . gabapentin (NEURONTIN) 300 MG capsule Take 300 mg by mouth at bedtime.  Marland Kitchen latanoprost (XALATAN) 0.005 % ophthalmic solution Place 1 drop into both eyes at bedtime.  Marland Kitchen loratadine (CLARITIN) 10 MG tablet Take 1 tablet (10 mg total) by mouth daily.  Marland Kitchen losartan (COZAAR) 50 MG tablet Take 50 mg by mouth daily.    Marland Kitchen pyridoxine (B-6) 100 MG tablet Take 100 mg by mouth daily.    . rosuvastatin (CRESTOR) 5 MG tablet Take 1 tablet (5 mg total) by mouth daily.  . vitamin B-12 (CYANOCOBALAMIN) 1000 MCG tablet Take 1,000 mcg by mouth daily.     Allergies:   Azithromycin; Iodine; Levofloxacin; Montelukast sodium; Cefaclor; Penicillins; Sulfa antibiotics; and Sulfonamide derivatives   Social History   Socioeconomic History  . Marital status: Married    Spouse name: Not on file  . Number of children: Not on file  . Years of education: Not on file  . Highest education level: Not on file  Occupational History  . Not on file  Social Needs  . Financial resource strain: Not on file  . Food insecurity:    Worry: Not on file    Inability: Not on file  . Transportation needs:    Medical: Not on file    Non-medical: Not on file  Tobacco Use  . Smoking status: Never Smoker  . Smokeless tobacco: Never Used  Substance and Sexual Activity  . Alcohol use: No  . Drug use: No  . Sexual activity: Not on file  Lifestyle  . Physical activity:    Days per week: Not on file    Minutes per session: Not on file  . Stress: Not on file  Relationships  . Social connections:    Talks on phone: Not on file    Gets together: Not on file    Attends religious service: Not on file    Active member of club or organization: Not on file    Attends  meetings of clubs or organizations: Not on file    Relationship status: Not on file  Other Topics Concern  . Not on file  Social History Narrative  . Not on file     Family History: The patient's family history includes Emphysema in her maternal uncle; Heart disease in her father. ROS:   Please see the history of present illness.    All other systems reviewed and are negative.  EKGs/Labs/Other Studies Reviewed:    The following studies were reviewed today:  EKG:  EKG ordered today.  The ekg ordered today demonstrates SRTH normal  Recent Labs: No results found for requested labs within last 8760  hours.  Recent Lipid Panel    Component Value Date/Time   CHOL 131 07/04/2017 1211   TRIG 60 07/04/2017 1211   HDL 62 07/04/2017 1211   CHOLHDL 2.1 07/04/2017 1211   LDLCALC 57 07/04/2017 1211    Physical Exam:    VS:  BP (!) 150/94 (BP Location: Right Arm, Patient Position: Sitting, Cuff Size: Normal)   Pulse (!) 57   Ht 5\' 1"  (1.549 m)   Wt 166 lb 2 oz (75.4 kg)   SpO2 98%   BMI 31.39 kg/m     Wt Readings from Last 3 Encounters:  08/08/18 166 lb 2 oz (75.4 kg)  03/28/18 161 lb 9.6 oz (73.3 kg)  02/08/18 164 lb 6.4 oz (74.6 kg)     GEN:  Well nourished, well developed in no acute distress HEENT: Normal NECK: No JVD; No carotid bruits LYMPHATICS: No lymphadenopathy CARDIAC: RRR, is a very faint barely able to be heard blowing apical murmur of MR less than 1 of 6 murmurs, rubs, gallops RESPIRATORY:  Clear to auscultation without rales, wheezing or rhonchi  ABDOMEN: Soft, non-tender, non-distended MUSCULOSKELETAL:  No edema; No deformity  SKIN: Warm and dry NEUROLOGIC:  Alert and oriented x 3 PSYCHIATRIC:  Normal affect    Signed, Norman Herrlich, MD  08/08/2018 9:25 AM    [facility]

## 2018-08-07 ENCOUNTER — Telehealth: Payer: Self-pay

## 2018-08-07 MED ORDER — ROSUVASTATIN CALCIUM 5 MG PO TABS: 5.0000 mg | ORAL_TABLET | Freq: Every day | ORAL | 1 refills | Status: DC

## 2018-08-07 NOTE — Telephone Encounter (Signed)
Rx for rosuvastatin sent to pharmacy as requested.

## 2018-08-08 ENCOUNTER — Encounter: Payer: Self-pay | Admitting: Cardiology

## 2018-08-08 ENCOUNTER — Ambulatory Visit (INDEPENDENT_AMBULATORY_CARE_PROVIDER_SITE_OTHER): Payer: Medicare Other | Admitting: Cardiology

## 2018-08-08 VITALS — BP 150/94 | HR 57 | Ht 61.0 in | Wt 166.1 lb

## 2018-08-08 DIAGNOSIS — I340 Nonrheumatic mitral (valve) insufficiency: Secondary | ICD-10-CM | POA: Diagnosis not present

## 2018-08-08 DIAGNOSIS — I491 Atrial premature depolarization: Secondary | ICD-10-CM

## 2018-08-08 DIAGNOSIS — I10 Essential (primary) hypertension: Secondary | ICD-10-CM

## 2018-08-08 DIAGNOSIS — I6521 Occlusion and stenosis of right carotid artery: Secondary | ICD-10-CM | POA: Diagnosis not present

## 2018-08-08 DIAGNOSIS — J4541 Moderate persistent asthma with (acute) exacerbation: Secondary | ICD-10-CM

## 2018-08-08 DIAGNOSIS — E782 Mixed hyperlipidemia: Secondary | ICD-10-CM

## 2018-08-08 LAB — COMPREHENSIVE METABOLIC PANEL
ALT: 30 IU/L (ref 0–32)
AST: 35 IU/L (ref 0–40)
Albumin/Globulin Ratio: 1.9 (ref 1.2–2.2)
Albumin: 4.3 g/dL (ref 3.5–4.8)
Alkaline Phosphatase: 79 IU/L (ref 39–117)
BUN/Creatinine Ratio: 16 (ref 12–28)
BUN: 12 mg/dL (ref 8–27)
Bilirubin Total: 0.4 mg/dL (ref 0.0–1.2)
CO2: 24 mmol/L (ref 20–29)
Calcium: 9.6 mg/dL (ref 8.7–10.3)
Chloride: 106 mmol/L (ref 96–106)
Creatinine, Ser: 0.73 mg/dL (ref 0.57–1.00)
GFR calc Af Amer: 96 mL/min/{1.73_m2} (ref >59)
GFR calc non Af Amer: 83 mL/min/{1.73_m2} (ref >59)
Globulin, Total: 2.3 g/dL (ref 1.5–4.5)
Glucose: 95 mg/dL (ref 65–99)
Potassium: 4.6 mmol/L (ref 3.5–5.2)
Sodium: 143 mmol/L (ref 134–144)
Total Protein: 6.6 g/dL (ref 6.0–8.5)

## 2018-08-08 LAB — LIPID PANEL
Chol/HDL Ratio: 2.2 ratio (ref 0.0–4.4)
Cholesterol, Total: 145 mg/dL (ref 100–199)
HDL: 67 mg/dL (ref >39)
LDL Calculated: 61 mg/dL (ref 0–99)
Triglycerides: 85 mg/dL (ref 0–149)
VLDL Cholesterol Cal: 17 mg/dL (ref 5–40)

## 2018-08-08 NOTE — Patient Instructions (Addendum)
Medication Instructions:  Your physician recommends that you continue on your current medications as directed. Please refer to the Current Medication list given to you today.  If you need a refill on your cardiac medications before your next appointment, please call your pharmacy.   Lab work: You will have labs drawn today: lipid panel and CMP  If you have labs (blood work) drawn today and your tests are completely normal, you will receive your results only by: Marland Kitchen. MyChart Message (if you have MyChart) OR . A paper copy in the mail If you have any lab test that is abnormal or we need to change your treatment, we will call you to review the results.  Testing/Procedures: You ill need [facility] prior to appt in 6 months.    Follow-Up: At [facility], you and your health needs are our priority.  As part of our continuing mission to provide you with exceptional heart care, we have created designated Provider Care Teams.  These Care Teams include your primary Cardiologist (physician) and Advanced Practice Providers (APPs -  Physician Assistants and Nurse Practitioners) who all work together to provide you with the care you need, when you need it. You will need a follow up appointment in 6 months.  Please call our office 2 months in advance to schedule this appointment.

## 2018-08-09 ENCOUNTER — Ambulatory Visit (INDEPENDENT_AMBULATORY_CARE_PROVIDER_SITE_OTHER): Payer: Medicare Other | Admitting: Internal Medicine

## 2018-08-09 ENCOUNTER — Encounter: Payer: Self-pay | Admitting: Internal Medicine

## 2018-08-09 DIAGNOSIS — J3089 Other allergic rhinitis: Secondary | ICD-10-CM

## 2018-08-09 DIAGNOSIS — I6521 Occlusion and stenosis of right carotid artery: Secondary | ICD-10-CM

## 2018-08-09 DIAGNOSIS — J302 Other seasonal allergic rhinitis: Secondary | ICD-10-CM | POA: Diagnosis not present

## 2018-08-09 DIAGNOSIS — J4540 Moderate persistent asthma, uncomplicated: Secondary | ICD-10-CM

## 2018-08-09 MED ORDER — PREDNISONE 10 MG PO TABS: ORAL_TABLET | ORAL | 2 refills | Status: DC

## 2018-08-09 MED ORDER — CLARITHROMYCIN 500 MG PO TABS: ORAL_TABLET | ORAL | 2 refills | Status: DC

## 2018-08-09 NOTE — Patient Instructions (Signed)
We can continue current meds  Refills sent to hold for biaxin and for prednisone taper to hold in case needed  Please call if we can help

## 2018-08-09 NOTE — Assessment & Plan Note (Signed)
Significant improvement in asthma control attributed to Xolair.  Rare need for rescue inhaler.  I agreed to provide Biaxin and prednisone taper to hold in case really needed during the winter holidays.

## 2018-08-09 NOTE — Assessment & Plan Note (Signed)
Well-controlled now in early winter without recent significant management problems.  She can use Flonase and antihistamine if needed.

## 2018-08-09 NOTE — Progress Notes (Signed)
Subjective:    Patient ID: Debra Byrd, female    DOB: 08/03/1946, 72 y.o.   MRN: [id number]  HPI Female never smoker followed for chronic obstructive asthma/Xolair/often steroid dependent, complicated by allergic rhinitis, peripheral neuropathy, glaucoma,  Offce Spirometry 08/08/2016-NL. FVC 2.04/77%, FEV1 1.64/81%, FEV1/FVC 0.80  --------------------------------------------------  03/28/2018- 72 year old female never smoker followed for chronic obstructive asthma/Xolair/ often steroid dependent, complicated by allergic rhinitis, peripheral neuropathy, glaucoma,  Advair 100, pro-air HFA, Xolair -----Asthma: had recent sore throat-was put on prednisone burst Rx; cough(worse at night), wheezing, Right side rib cage pain/soreness, sinus drainage, and cough yellow in color.  Acute illness began 11 days ago as sore throat with persistent drainage dry cough which has begun productive yellow.  No fever.  Daughter lives with her and was treated for bronchitis.  Took prednisone and Augmentin from PCP with limited benefit.  Maintenance doxycycline 50 mg for eye problem. This morning tussive right lateral rib cage pain.  Had to miss Xolair injection last week because of this cough.  Is able to take Biaxin.  08/09/2018- 72 year old female never smoker followed for chronic obstructive asthma/Xolair/ often steroid dependent, complicated by allergic rhinitis, peripheral neuropathy, glaucoma,  Advair 100, pro-air HFA, Xolair, neb albuterol, benzonatate -----Doing well at this time.  She continues to credit Xolair for dramatic improvement in her asthma.  Rarely needs rescue inhaler. Asks for Biaxin and prednisone to hold this winter as before.  Had a rash confined to her thighs after Zithromax, but says okay with Biaxin. Having more back pain as she has been trying to help her disabled daughter after daughter's back surgery. CXR 03/28/2018  No evidence of active disease.  Stable compared to 2011.  ROS-see  HPI-+ = positive Constitutional:   No-   weight loss, night sweats, fevers, chills, +fatigue, lassitude. HEENT:   No-   difficulty swallowing, tooth/dental problems, sore throat,       No-  sneezing, itching, ear ache,  nasal congestion, post nasal drip,  CV:  + Noncardiac chest pain, no- orthopnea, PND, swelling in lower extremities,                                          anasarca, dizziness, palpitations Resp:+shortness of breath with exertion or at rest.               +productive cough,   +non-productive cough,  No- coughing up of blood.              +  change in color of mucus.  +Little wheezing.   Skin: No-   rash or lesions. GI:  No-   heartburn, indigestion, abdominal pain, nausea, vomiting,  GU: pain. MS:  +joint pain or swelling.   Neuro-     nothing unusualk pain. Psych:  No- change in mood or affect. No depression or anxiety.  No memory loss.   Objective:   Physical Exam              General- Alert, Oriented, Affect-appropriate, Distress- none acute; + overweight,  Skin- rash-none, lesions- none, excoriation- none Lymphadenopathy- none Head- atraumatic            Eyes- Gross vision intact, PERRLA, conjunctivae clear secretions. Chronic periorbital edema.            Ears- Hearing, canals-normal            Nose- Clear,  no-Septal dev, mucus, polyps, erosion, perforation             Throat- Mallampati II-III , mucosa clear , drainage- none, tonsils- atrophic,  Neck- flexible , trachea midline, no stridor , thyroid nl, carotid no bruit Chest - symmetrical excursion , unlabored           Heart/CV- RRR , no murmur , no gallop  , no rub, nl s1 s2                           - JVD- none , edema- none, stasis changes- none, varices- none           Lung- clear, unlabored, dullness-none, rub- none,  Cough- none           Chest wall-  Abd-  Br/ Gen/ Rectal- Not done, not indicated Extrem- cyanosis- none, clubbing, none, atrophy- none, strength- nl Neuro- grossly intact to  observation

## 2018-08-20 ENCOUNTER — Telehealth: Payer: Self-pay | Admitting: Internal Medicine

## 2018-08-20 NOTE — Telephone Encounter (Signed)
Prefilled Syringe: #150mg  1  #75mg  0 Ordered Date: 08/20/18 Shipping Date: 08/23/18

## 2018-08-23 ENCOUNTER — Ambulatory Visit (INDEPENDENT_AMBULATORY_CARE_PROVIDER_SITE_OTHER): Payer: Medicare Other

## 2018-08-23 DIAGNOSIS — J4540 Moderate persistent asthma, uncomplicated: Secondary | ICD-10-CM

## 2018-08-23 NOTE — Telephone Encounter (Signed)
Prefilled Syringes: # 150mg  1  #75mg  0 Arrival Date: 08/23/18 Lot #: 150mg  [id number]      75mg  0 Exp Date: 150mg  01/2019   75mg  0

## 2018-08-24 DIAGNOSIS — J4540 Moderate persistent asthma, uncomplicated: Secondary | ICD-10-CM

## 2018-08-24 MED ORDER — OMALIZUMAB 150 MG ~~LOC~~ SOLR
150.0000 mg | Freq: Once | SUBCUTANEOUS | Status: AC
  Administered 2018-08-24: 150 mg via SUBCUTANEOUS

## 2018-08-24 NOTE — Progress Notes (Signed)
Documented by Jessica Jones CMA based on hand-written Xolair documentation sheet completed by Tammy Scott CMA, who administered the medication.  

## 2018-09-04 ENCOUNTER — Other Ambulatory Visit: Payer: Self-pay

## 2018-09-04 ENCOUNTER — Encounter: Payer: Self-pay | Admitting: Podiatry

## 2018-09-04 ENCOUNTER — Ambulatory Visit (INDEPENDENT_AMBULATORY_CARE_PROVIDER_SITE_OTHER): Payer: Medicare Other | Admitting: Podiatry

## 2018-09-04 VITALS — BP 126/75 | HR 60 | Resp 16 | Ht 61.0 in | Wt 165.0 lb

## 2018-09-04 DIAGNOSIS — M7750 Other enthesopathy of unspecified foot: Secondary | ICD-10-CM | POA: Diagnosis not present

## 2018-09-04 DIAGNOSIS — D689 Coagulation defect, unspecified: Secondary | ICD-10-CM | POA: Diagnosis not present

## 2018-09-04 DIAGNOSIS — Q828 Other specified congenital malformations of skin: Secondary | ICD-10-CM

## 2018-09-04 NOTE — Progress Notes (Addendum)
Subjective:  Patient ID: Debra Byrd, female    DOB: 09/20/1945,  MRN: [id number]  Chief Complaint  Patient presents with  . Skin Problem    L submet 2 x years off and on but last few wks very irritated; 3/10 sorness -no injury Tx: none    73 y.o. female presents with the above complaint.   Review of Systems: Negative except as noted in the HPI. Denies N/V/F/Ch.  Past Medical History:  Diagnosis Date  . Allergic rhinitis, cause unspecified   . Asthma   . Chronic airway obstruction, not elsewhere classified   . Coronary atherosclerosis of unspecified type of vessel, native or graft   . Mitral valve disorders(424.0)   . Mononeuritis of unspecified site   . Myalgia and myositis, unspecified   . Peripheral vascular disease, unspecified (HCC)   . Sleep disturbance, unspecified   . Unspecified glaucoma(365.9)     Current Outpatient Medications:  .  albuterol (PROAIR HFA) 108 (90 Base) MCG/ACT inhaler, Inhale 2 puffs into the lungs every 6 (six) hours as needed for wheezing., Disp: 3 Inhaler, Rfl: 3 .  albuterol (PROVENTIL) (2.5 MG/3ML) 0.083% nebulizer solution, Take 3 mLs (2.5 mg total) by nebulization 4 (four) times daily as needed for wheezing., Disp: 120 vial, Rfl: 12 .  calcium gluconate 500 MG tablet, Take 500 mg by mouth 2 (two) times daily.  , Disp: , Rfl:  .  cholecalciferol (VITAMIN D) 1000 UNITS tablet, Take 1,000 Units by mouth daily., Disp: , Rfl:  .  clindamycin (CLEOCIN) 300 MG capsule, TAKE 1 CAPSULE BY MOUTH EVERY 6 HOURS, Disp: , Rfl: 0 .  clopidogrel (PLAVIX) 75 MG tablet, Take 1 tablet (75 mg total) by mouth daily., Disp: 90 tablet, Rfl: 0 .  Coenzyme Q10 (COQ10) 100 MG CAPS, Take 1 capsule by mouth daily., Disp: , Rfl:  .  dexlansoprazole (DEXILANT) 60 MG capsule, Take 60 mg by mouth daily.  , Disp: , Rfl:  .  doxycycline (ADOXA) 50 MG tablet, Take 50 mg by mouth daily., Disp: , Rfl:  .  fish oil-omega-3 fatty acids 1000 MG capsule, 4 by mouth daily , Disp: ,  Rfl:  .  fluticasone (FLONASE) 50 MCG/ACT nasal spray, Place 2 sprays into both nostrils daily., Disp: 3 g, Rfl: 3 .  Fluticasone-Salmeterol (ADVAIR DISKUS) 100-50 MCG/DOSE AEPB, Inhale 1 puff and rinse mouth, twice daily, Disp: 3 each, Rfl: 3 .  gabapentin (NEURONTIN) 300 MG capsule, Take 300 mg by mouth at bedtime., Disp: , Rfl: 3 .  loratadine (CLARITIN) 10 MG tablet, Take 1 tablet (10 mg total) by mouth daily., Disp: 90 tablet, Rfl: 3 .  losartan (COZAAR) 50 MG tablet, Take 50 mg by mouth daily.  , Disp: , Rfl:  .  rosuvastatin (CRESTOR) 5 MG tablet, Take 1 tablet (5 mg total) by mouth daily., Disp: 90 tablet, Rfl: 1 .  vitamin B-12 (CYANOCOBALAMIN) 1000 MCG tablet, Take 1,000 mcg by mouth daily., Disp: , Rfl:   Social History   Tobacco Use  Smoking Status Never Smoker  Smokeless Tobacco Never Used    Allergies  Allergen Reactions  . Iodinated Diagnostic Agents Shortness Of Breath and Swelling    Swelling in facial/No issues with shellfish   . Azithromycin Hives    No problem with Biaxin  . Iodine Swelling  . Levofloxacin     Tendon tears  . Montelukast Sodium Swelling  . Cefaclor Rash  . Penicillins Swelling  . Sulfa Antibiotics Rash  . Sulfonamide  Derivatives Rash   Objective:   Vitals:   09/04/18 1417  BP: 126/75  Pulse: 60  Resp: 16   Body mass index is 31.18 kg/m. Constitutional Well developed. Well nourished.  Vascular Dorsalis pedis pulses palpable bilaterally. Posterior tibial pulses palpable bilaterally. Capillary refill normal to all digits.  No cyanosis or clubbing noted. Pedal hair growth normal.  Neurologic Normal speech. Oriented to person, place, and time. Epicritic sensation to light touch grossly present bilaterally.  Dermatologic Nails normal Skin punctate hyperkeratotic lesions around the second metatarsal plantarly bilaterally  Orthopedic: Normal joint ROM without pain or crepitus bilaterally. No visible deformities. No bony tenderness.     Assessment:   1. Capsulitis of metatarsophalangeal (MTP) joint   2. Porokeratosis   3. 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  Porokeratoses, Coagulation Defect -Lesions debrided x4 without iatrogenic bleed. -Educated that due to the use of Plavix would benefit from routine foot care services  Return in about 9 weeks (around 11/06/2018) for Routine Foot Care - On AC.

## 2018-09-04 NOTE — Progress Notes (Signed)
   Subjective:    Patient ID: Debra Byrd, female    DOB: Mar 01, 1946, 73 y.o.   MRN: [id number]  HPI    Review of Systems  All other systems reviewed and are negative.      Objective:   Physical Exam        Assessment & Plan:

## 2018-09-20 ENCOUNTER — Telehealth: Payer: Self-pay | Admitting: Internal Medicine

## 2018-09-21 ENCOUNTER — Ambulatory Visit (INDEPENDENT_AMBULATORY_CARE_PROVIDER_SITE_OTHER): Payer: Medicare Other

## 2018-09-21 DIAGNOSIS — J4540 Moderate persistent asthma, uncomplicated: Secondary | ICD-10-CM

## 2018-09-21 NOTE — Telephone Encounter (Signed)
Prefilled Syringes: # 150mg  1  #75mg  0 Arrival Date: 09/21/2018 Lot #: 150mg  [id number]      75mg  0 Exp Date: 150mg  07/2019   75mg  0

## 2018-09-21 NOTE — Telephone Encounter (Signed)
Prefilled Syringe: #150mg  1  #75mg  0 Ordered Date: 09/20/2018 Shipping Date: 09/20/2018  Didn't get a chance to put order in.

## 2018-09-26 MED ORDER — OMALIZUMAB 150 MG ~~LOC~~ SOLR
150.0000 mg | Freq: Once | SUBCUTANEOUS | Status: AC
  Administered 2018-09-21: 150 mg via SUBCUTANEOUS

## 2018-10-19 ENCOUNTER — Ambulatory Visit (INDEPENDENT_AMBULATORY_CARE_PROVIDER_SITE_OTHER): Payer: Medicare Other

## 2018-10-19 DIAGNOSIS — J4540 Moderate persistent asthma, uncomplicated: Secondary | ICD-10-CM

## 2018-10-22 MED ORDER — OMALIZUMAB 150 MG ~~LOC~~ SOLR
150.0000 mg | Freq: Once | SUBCUTANEOUS | Status: AC
  Administered 2018-10-19: 150 mg via SUBCUTANEOUS

## 2018-10-25 ENCOUNTER — Telehealth: Payer: Self-pay | Admitting: Internal Medicine

## 2018-10-25 NOTE — Telephone Encounter (Signed)
Per our records, pt's Xolair PA has expired. Called [facility] [phone]. Pt's insurance ID: [id number]. PA could not be completed over the phone. PA has been iniatated on Cover My Meds. Key: [id number].

## 2018-10-30 NOTE — Telephone Encounter (Signed)
Checked Cover My Meds. PA was denied. Appeal will need to be initiated. Will follow up on this later this week.

## 2018-10-31 ENCOUNTER — Encounter: Payer: Self-pay | Admitting: Internal Medicine

## 2018-10-31 NOTE — Telephone Encounter (Signed)
Letter for PA written and given to Marisue Ivan

## 2018-10-31 NOTE — Telephone Encounter (Signed)
CY - a letter will need to be written from you in order to appeal this pt's Xolair denial per the pt's insurance.

## 2018-11-01 NOTE — Telephone Encounter (Signed)
Appeal letter has been located and faxed to [facility] at [phone]. Await appeal decision.

## 2018-11-02 ENCOUNTER — Other Ambulatory Visit: Payer: Self-pay | Admitting: *Deleted

## 2018-11-02 MED ORDER — CLOPIDOGREL BISULFATE 75 MG PO TABS: 75.0000 mg | ORAL_TABLET | Freq: Every day | ORAL | 0 refills | Status: DC

## 2018-11-06 ENCOUNTER — Ambulatory Visit: Payer: Medicare Other | Admitting: Podiatry

## 2018-11-07 NOTE — Telephone Encounter (Signed)
Call made to [facility] [phone], they do not need anything else, they told me I need to call [facility]. Called [phone] state health plan, they have received the appeal 11/01/2018. It can take up to 30 days for the appeal. Case # [id number]. [facility] [phone] option # 1, the option # 2. Will f/u at later date to check status of appeal.

## 2018-11-14 NOTE — Telephone Encounter (Signed)
Received appeal from [facility] that Xolair is approved for the period from March 19th, 2020 through March 19th 2021. Will make Tammy Scott aware. Nothing further needed at this time.

## 2018-11-16 ENCOUNTER — Ambulatory Visit (INDEPENDENT_AMBULATORY_CARE_PROVIDER_SITE_OTHER): Payer: Medicare Other

## 2018-11-16 ENCOUNTER — Other Ambulatory Visit: Payer: Self-pay

## 2018-11-16 DIAGNOSIS — J4540 Moderate persistent asthma, uncomplicated: Secondary | ICD-10-CM | POA: Diagnosis not present

## 2018-11-16 MED ORDER — OMALIZUMAB 150 MG ~~LOC~~ SOLR
150.0000 mg | Freq: Once | SUBCUTANEOUS | Status: AC
  Administered 2018-11-16: 150 mg via SUBCUTANEOUS

## 2018-12-14 ENCOUNTER — Ambulatory Visit (INDEPENDENT_AMBULATORY_CARE_PROVIDER_SITE_OTHER): Payer: Medicare Other

## 2018-12-14 ENCOUNTER — Other Ambulatory Visit: Payer: Self-pay

## 2018-12-14 DIAGNOSIS — J4540 Moderate persistent asthma, uncomplicated: Secondary | ICD-10-CM

## 2018-12-14 MED ORDER — OMALIZUMAB 150 MG ~~LOC~~ SOLR
150.0000 mg | Freq: Once | SUBCUTANEOUS | Status: AC
  Administered 2018-12-14: 150 mg via SUBCUTANEOUS

## 2018-12-14 NOTE — Progress Notes (Signed)
Have you been hospitalized within the last 10 days?  No Do you have a fever?  No Do you have a cough?  No Do you have a headache or sore throat? No  

## 2018-12-18 ENCOUNTER — Ambulatory Visit: Payer: Medicare Other | Admitting: Podiatry

## 2018-12-18 ENCOUNTER — Other Ambulatory Visit: Payer: Self-pay

## 2018-12-18 ENCOUNTER — Ambulatory Visit (INDEPENDENT_AMBULATORY_CARE_PROVIDER_SITE_OTHER): Payer: Medicare Other | Admitting: Podiatry

## 2018-12-18 DIAGNOSIS — Q828 Other specified congenital malformations of skin: Secondary | ICD-10-CM

## 2018-12-18 DIAGNOSIS — D689 Coagulation defect, unspecified: Secondary | ICD-10-CM | POA: Diagnosis not present

## 2018-12-18 NOTE — Progress Notes (Signed)
Subjective:  Patient ID: Debra CraftsLynna L Odonnell, female    DOB: 02/22/1946,  MRN: [id number]  No chief complaint on file.   73 y.o. female presents for routine foot care services. On Plavix. States the lesions felt much better after being shaved down.  Review of Systems: Negative except as noted in the HPI. Denies N/V/F/Ch.  Past Medical History:  Diagnosis Date  . Allergic rhinitis, cause unspecified   . Asthma   . Chronic airway obstruction, not elsewhere classified   . Coronary atherosclerosis of unspecified type of vessel, native or graft   . Mitral valve disorders(424.0)   . Mononeuritis of unspecified site   . Myalgia and myositis, unspecified   . Peripheral vascular disease, unspecified (HCC)   . Sleep disturbance, unspecified   . Unspecified glaucoma(365.9)     Current Outpatient Medications:  .  albuterol (PROAIR HFA) 108 (90 Base) MCG/ACT inhaler, Inhale 2 puffs into the lungs every 6 (six) hours as needed for wheezing., Disp: 3 Inhaler, Rfl: 3 .  albuterol (PROVENTIL) (2.5 MG/3ML) 0.083% nebulizer solution, Take 3 mLs (2.5 mg total) by nebulization 4 (four) times daily as needed for wheezing., Disp: 120 vial, Rfl: 12 .  calcium gluconate 500 MG tablet, Take 500 mg by mouth 2 (two) times daily.  , Disp: , Rfl:  .  cholecalciferol (VITAMIN D) 1000 UNITS tablet, Take 1,000 Units by mouth daily., Disp: , Rfl:  .  clindamycin (CLEOCIN) 300 MG capsule, TAKE 1 CAPSULE BY MOUTH EVERY 6 HOURS, Disp: , Rfl: 0 .  clopidogrel (PLAVIX) 75 MG tablet, Take 1 tablet (75 mg total) by mouth daily., Disp: 90 tablet, Rfl: 0 .  Coenzyme Q10 (COQ10) 100 MG CAPS, Take 1 capsule by mouth daily., Disp: , Rfl:  .  dexlansoprazole (DEXILANT) 60 MG capsule, Take 60 mg by mouth daily.  , Disp: , Rfl:  .  doxycycline (ADOXA) 50 MG tablet, Take 50 mg by mouth daily., Disp: , Rfl:  .  fish oil-omega-3 fatty acids 1000 MG capsule, 4 by mouth daily , Disp: , Rfl:  .  fluticasone (FLONASE) 50 MCG/ACT nasal spray,  Place 2 sprays into both nostrils daily., Disp: 3 g, Rfl: 3 .  Fluticasone-Salmeterol (ADVAIR DISKUS) 100-50 MCG/DOSE AEPB, Inhale 1 puff and rinse mouth, twice daily, Disp: 3 each, Rfl: 3 .  gabapentin (NEURONTIN) 300 MG capsule, Take 300 mg by mouth at bedtime., Disp: , Rfl: 3 .  loratadine (CLARITIN) 10 MG tablet, Take 1 tablet (10 mg total) by mouth daily., Disp: 90 tablet, Rfl: 3 .  losartan (COZAAR) 50 MG tablet, Take 50 mg by mouth daily.  , Disp: , Rfl:  .  rosuvastatin (CRESTOR) 5 MG tablet, Take 1 tablet (5 mg total) by mouth daily., Disp: 90 tablet, Rfl: 1 .  vitamin B-12 (CYANOCOBALAMIN) 1000 MCG tablet, Take 1,000 mcg by mouth daily., Disp: , Rfl:   Social History   Tobacco Use  Smoking Status Never Smoker  Smokeless Tobacco Never Used    Allergies  Allergen Reactions  . Iodinated Diagnostic Agents Shortness Of Breath and Swelling    Swelling in facial/No issues with shellfish   . Azithromycin Hives    No problem with Biaxin  . Iodine Swelling  . Levofloxacin     Tendon tears  . Montelukast Sodium Swelling  . Cefaclor Rash  . Penicillins Swelling  . Sulfa Antibiotics Rash  . Sulfonamide Derivatives Rash   Objective:   There were no vitals filed for this visit. There is  no height or weight on file to calculate BMI. Constitutional Well developed. Well nourished.  Vascular Dorsalis pedis pulses palpable bilaterally. Posterior tibial pulses palpable bilaterally. Capillary refill normal to all digits.  No cyanosis or clubbing noted. Pedal hair growth normal.  Neurologic Normal speech. Oriented to person, place, and time. Epicritic sensation to light touch grossly present bilaterally.  Dermatologic Nails normal Skin punctate hyperkeratotic lesions around the second metatarsal plantarly bilaterally  Orthopedic: Normal joint ROM without pain or crepitus bilaterally. No visible deformities. No bony tenderness.   Assessment:   1. Porokeratosis   2. Coagulation  defect [facility])    Plan:  Patient was evaluated and treated and all questions answered.  Porokeratoses, Coagulation Defect -Lesions debrided x5. Offloaded with pads. Salinocaine applied.  Procedure: Paring of Lesion Rationale: painful hyperkeratotic lesion Type of Debridement: manual, sharp debridement. Instrumentation: 312 blade Number of Lesions: 5   Return in about 9 weeks (around 02/19/2019) for RFC - on AC.

## 2019-01-04 ENCOUNTER — Telehealth: Payer: Self-pay | Admitting: Internal Medicine

## 2019-01-04 DIAGNOSIS — J4540 Moderate persistent asthma, uncomplicated: Secondary | ICD-10-CM

## 2019-01-04 NOTE — Telephone Encounter (Signed)
Called and spoke with pt to clarify who DME was that she received nebulizer and supplies from and pt stated it was [facility] in [place]. Asked pt if she tried to call them to receive supplies and pt stated that she had and they told her she needed to have a new prescription sent to them in order to receive supplies.  I stated to pt that we would place an order to [facility] in [place] so she will be able to receive supplies and pt verbalized understanding. Order has been placed. Nothing further needed.

## 2019-01-11 ENCOUNTER — Ambulatory Visit (INDEPENDENT_AMBULATORY_CARE_PROVIDER_SITE_OTHER): Payer: Medicare Other

## 2019-01-11 ENCOUNTER — Other Ambulatory Visit: Payer: Self-pay

## 2019-01-11 DIAGNOSIS — J4540 Moderate persistent asthma, uncomplicated: Secondary | ICD-10-CM | POA: Diagnosis not present

## 2019-01-11 MED ORDER — OMALIZUMAB 150 MG ~~LOC~~ SOLR
150.0000 mg | Freq: Once | SUBCUTANEOUS | Status: AC
  Administered 2019-01-11: 150 mg via SUBCUTANEOUS

## 2019-01-11 NOTE — Progress Notes (Signed)
Have you been hospitalized within the last 10 days?  No Do you have a fever?  No Do you have a cough?  No Do you have a headache or sore throat? No  

## 2019-01-28 ENCOUNTER — Other Ambulatory Visit: Payer: Self-pay | Admitting: Cardiology

## 2019-02-08 ENCOUNTER — Ambulatory Visit (INDEPENDENT_AMBULATORY_CARE_PROVIDER_SITE_OTHER): Payer: Medicare Other

## 2019-02-08 ENCOUNTER — Telehealth: Payer: Self-pay | Admitting: Cardiology

## 2019-02-08 ENCOUNTER — Other Ambulatory Visit: Payer: Self-pay

## 2019-02-08 DIAGNOSIS — J4540 Moderate persistent asthma, uncomplicated: Secondary | ICD-10-CM

## 2019-02-08 MED ORDER — ROSUVASTATIN CALCIUM 5 MG PO TABS: 5.0000 mg | ORAL_TABLET | Freq: Every day | ORAL | 0 refills | Status: DC

## 2019-02-08 MED ORDER — OMALIZUMAB 150 MG ~~LOC~~ SOLR
150.0000 mg | Freq: Once | SUBCUTANEOUS | Status: AC
  Administered 2019-02-08: 150 mg via SUBCUTANEOUS

## 2019-02-08 NOTE — Telephone Encounter (Signed)
Rosuvastatin refill sent to [facility] in [place]

## 2019-02-08 NOTE — Progress Notes (Signed)
Have you been hospitalized within the last 10 days?  No Do you have a fever?  No Do you have a cough?  No Do you have a headache or sore throat? No  

## 2019-02-08 NOTE — Telephone Encounter (Signed)
Call generic crestor to [facility]

## 2019-02-08 NOTE — Addendum Note (Signed)
Addended by: Polly Cobia A on: 02/08/2019 10:53 AM   Modules accepted: Orders

## 2019-02-14 ENCOUNTER — Encounter: Payer: Self-pay | Admitting: Internal Medicine

## 2019-02-14 ENCOUNTER — Ambulatory Visit (INDEPENDENT_AMBULATORY_CARE_PROVIDER_SITE_OTHER): Payer: Medicare Other | Admitting: Internal Medicine

## 2019-02-14 ENCOUNTER — Other Ambulatory Visit: Payer: Self-pay

## 2019-02-14 DIAGNOSIS — J4540 Moderate persistent asthma, uncomplicated: Secondary | ICD-10-CM | POA: Diagnosis not present

## 2019-02-14 DIAGNOSIS — J302 Other seasonal allergic rhinitis: Secondary | ICD-10-CM | POA: Diagnosis not present

## 2019-02-14 DIAGNOSIS — J3089 Other allergic rhinitis: Secondary | ICD-10-CM

## 2019-02-14 NOTE — Patient Instructions (Signed)
We can continue Xolair and current meds.  Please call if we can help

## 2019-02-14 NOTE — Progress Notes (Signed)
Subjective:    Patient ID: Debra Byrd, female    DOB: 1946/08/19, 73 y.o.   MRN: [id number]  HPI Female never smoker followed for chronic obstructive asthma/Xolair/often steroid dependent, complicated by allergic rhinitis, peripheral neuropathy, glaucoma,  [place]ce Spirometry 08/08/2016-NL. FVC 2.04/77%, FEV1 1.64/81%, FEV1/FVC 0.80 HST 07/26/13- WNL  AHI 4.4/ hr, desaturation to 73%, body weight 175 lbs --------------------------------------------------  08/09/2018- 73 year old female never smoker followed for chronic obstructive asthma/Xolair/ often steroid dependent, complicated by allergic rhinitis, peripheral neuropathy, glaucoma,  Advair 100, pro-air HFA, Xolair, neb albuterol, benzonatate -----Doing well at this time.  She continues to credit Xolair for dramatic improvement in her asthma.  Rarely needs rescue inhaler. Asks for Biaxin and prednisone to hold this winter as before.  Had a rash confined to her thighs after Zithromax, but says okay with Biaxin. Having more back pain as she has been trying to help her disabled daughter after daughter's back surgery. CXR 03/28/2018  No evidence of active disease.  Stable compared to 2011.  02/14/2019- 73 year old female never smoker followed for chronic obstructive asthma/Xolair/ often steroid dependent, complicated by allergic rhinitis, peripheral neuropathy, Glaucoma,  Advair 100, pro-air HFA, Xolair, neb albuterol, benzonatate -----breathing at baseline, usual seasonal flare-ups, taking Xolair injections every 4 weeks Xolair continues to make big difference, No acute issues, pendidng cataract sgy. Trying to walk in home 30 min daily- social distancing. Back pain limits walking outdoors on uneven surfaces.Little wheeze or cough.   ROS-see HPI-+ = positive Constitutional:   No-   weight loss, night sweats, fevers, chills, +fatigue, lassitude. HEENT:   No-   difficulty swallowing, tooth/dental problems, sore throat,       No-  sneezing,  itching, ear ache,  nasal congestion, post nasal drip,  CV:  + Noncardiac chest pain, no- orthopnea, PND, swelling in lower extremities,                                          anasarca, dizziness, palpitations Resp:+shortness of breath with exertion or at rest.               +productive cough,   +non-productive cough,  No- coughing up of blood.              +  change in color of mucus.  +Little wheezing.   Skin: No-   rash or lesions. GI:  No-   heartburn, indigestion, abdominal pain, nausea, vomiting,  GU: pain. MS:  +joint pain or swelling.   Neuro-     nothing unusualk pain. Psych:  No- change in mood or affect. No depression or anxiety.  No memory loss.   Objective:   Physical Exam              General- Alert, Oriented, Affect-appropriate, Distress- none acute;  + overweight,  Skin- rash-none, lesions- none, excoriation- none Lymphadenopathy- none Head- atraumatic            Eyes- Gross vision intact, PERRLA, conjunctivae clear secretions. Chronic periorbital edema.            Ears- Hearing, canals-normal            Nose- Clear, no-Septal dev, mucus, polyps, erosion, perforation             Throat- Mallampati II-III , mucosa clear , drainage- none, tonsils- atrophic,  Neck- flexible , trachea midline, no stridor , thyroid nl,  carotid no bruit Chest - symmetrical excursion , unlabored           Heart/CV- RRR , no murmur , no gallop  , no rub, nl s1 s2                           - JVD- none , edema- none, stasis changes- none, varices- none           Lung- clear, unlabored, dullness-none, rub- none,  Cough- none           Chest wall-  Abd-  Br/ Gen/ Rectal- Not done, not indicated Extrem- cyanosis- none, clubbing, none, atrophy- none, strength- nl Neuro- grossly intact to observation

## 2019-02-15 NOTE — Assessment & Plan Note (Signed)
Xolair has been very effective. Well controlled. Plan- Continue current meds.

## 2019-02-15 NOTE — Assessment & Plan Note (Signed)
Seasonl exacerb this Spring now better. AH and flonase.

## 2019-02-19 ENCOUNTER — Encounter: Payer: Self-pay | Admitting: Podiatry

## 2019-02-19 ENCOUNTER — Other Ambulatory Visit: Payer: Self-pay

## 2019-02-19 ENCOUNTER — Ambulatory Visit (INDEPENDENT_AMBULATORY_CARE_PROVIDER_SITE_OTHER): Payer: Medicare Other | Admitting: Podiatry

## 2019-02-19 VITALS — Temp 98.4°F | Resp 16

## 2019-02-19 DIAGNOSIS — B351 Tinea unguium: Secondary | ICD-10-CM

## 2019-02-19 DIAGNOSIS — D689 Coagulation defect, unspecified: Secondary | ICD-10-CM | POA: Diagnosis not present

## 2019-02-19 DIAGNOSIS — Q828 Other specified congenital malformations of skin: Secondary | ICD-10-CM | POA: Diagnosis not present

## 2019-02-19 NOTE — Progress Notes (Signed)
Subjective:  Patient ID: Debra CraftsLynna L Pizzuto, female    DOB: 11/10/1945,  MRN: [id number]  Chief Complaint  Patient presents with  . debride    ROutine foot care and callus tirmming -pt on plavix    73 y.o. female presents for routine foot care services. On Plavix.   Review of Systems: Negative except as noted in the HPI. Denies N/V/F/Ch.  Past Medical History:  Diagnosis Date  . Allergic rhinitis, cause unspecified   . Asthma   . Chronic airway obstruction, not elsewhere classified   . Coronary atherosclerosis of unspecified type of vessel, native or graft   . Mitral valve disorders(424.0)   . Mononeuritis of unspecified site   . Myalgia and myositis, unspecified   . Peripheral vascular disease, unspecified (HCC)   . Sleep disturbance, unspecified   . Unspecified glaucoma(365.9)     Current Outpatient Medications:  .  albuterol (PROAIR HFA) 108 (90 Base) MCG/ACT inhaler, Inhale 2 puffs into the lungs every 6 (six) hours as needed for wheezing., Disp: 3 Inhaler, Rfl: 3 .  albuterol (PROVENTIL) (2.5 MG/3ML) 0.083% nebulizer solution, Take 3 mLs (2.5 mg total) by nebulization 4 (four) times daily as needed for wheezing., Disp: 120 vial, Rfl: 12 .  Ascorbic Acid (VITAMIN C) 1000 MG tablet, Take 1,000 mg by mouth daily., Disp: , Rfl:  .  Calcium Carbonate-Vit D-Min (CALCIUM 1200 PO), Take 1,200 mg by mouth daily., Disp: , Rfl:  .  calcium gluconate 500 MG tablet, Take 500 mg by mouth 2 (two) times daily.  , Disp: , Rfl:  .  cholecalciferol (VITAMIN D) 1000 UNITS tablet, Take 1,000 Units by mouth daily., Disp: , Rfl:  .  clindamycin (CLEOCIN) 300 MG capsule, TAKE 1 CAPSULE BY MOUTH EVERY 6 HOURS, Disp: , Rfl: 0 .  clopidogrel (PLAVIX) 75 MG tablet, TAKE 1 TABLET BY MOUTH EVERY DAY, Disp: 30 tablet, Rfl: 0 .  Coenzyme Q10 (COQ10) 100 MG CAPS, Take 1 capsule by mouth daily., Disp: , Rfl:  .  dexlansoprazole (DEXILANT) 60 MG capsule, Take 60 mg by mouth daily.  , Disp: , Rfl:  .  doxycycline  (ADOXA) 50 MG tablet, Take 50 mg by mouth daily., Disp: , Rfl:  .  fish oil-omega-3 fatty acids 1000 MG capsule, 4 by mouth daily , Disp: , Rfl:  .  fluticasone (FLONASE) 50 MCG/ACT nasal spray, Place 2 sprays into both nostrils daily., Disp: 3 g, Rfl: 3 .  Fluticasone-Salmeterol (ADVAIR DISKUS) 100-50 MCG/DOSE AEPB, Inhale 1 puff and rinse mouth, twice daily, Disp: 3 each, Rfl: 3 .  gabapentin (NEURONTIN) 300 MG capsule, Take 300 mg by mouth at bedtime., Disp: , Rfl: 3 .  loratadine (CLARITIN) 10 MG tablet, Take 1 tablet (10 mg total) by mouth daily., Disp: 90 tablet, Rfl: 3 .  losartan (COZAAR) 25 MG tablet, , Disp: , Rfl:  .  losartan (COZAAR) 50 MG tablet, Take 50 mg by mouth daily.  , Disp: , Rfl:  .  rosuvastatin (CRESTOR) 5 MG tablet, Take 1 tablet (5 mg total) by mouth daily., Disp: 90 tablet, Rfl: 0 .  tobramycin (TOBREX) 0.3 % ophthalmic solution, INSTILL 1 DROP INTO RIGHT EYE 4 TIMES A DAY, Disp: , Rfl:  .  valsartan (DIOVAN) 40 MG tablet, Take 40 mg by mouth daily., Disp: , Rfl:  .  vitamin B-12 (CYANOCOBALAMIN) 1000 MCG tablet, Take 1,000 mcg by mouth daily., Disp: , Rfl:   Social History   Tobacco Use  Smoking Status Never Smoker  Smokeless Tobacco Never Used    Allergies  Allergen Reactions  . Iodinated Diagnostic Agents Shortness Of Breath and Swelling    Swelling in facial/No issues with shellfish   . Azithromycin Hives    No problem with Biaxin  . Iodine Swelling  . Levofloxacin     Tendon tears  . Montelukast Sodium Swelling  . Cefaclor Rash  . Penicillins Swelling  . Sulfa Antibiotics Rash  . Sulfonamide Derivatives Rash   Objective:   Vitals:   02/19/19 1044  Resp: 16  Temp: 98.4 F (36.9 C)   There is no height or weight on file to calculate BMI. Constitutional Well developed. Well nourished.  Vascular Dorsalis pedis pulses palpable bilaterally. Posterior tibial pulses palpable bilaterally. Capillary refill normal to all digits.  No cyanosis or  clubbing noted. Pedal hair growth normal.  Neurologic Normal speech. Oriented to person, place, and time. Epicritic sensation to light touch grossly present bilaterally.  Dermatologic Nails elongated. Skin punctate hyperkeratotic lesions around the second metatarsal plantarly bilaterally, medial 1st MPJ bilat  Orthopedic: Normal joint ROM without pain or crepitus bilaterally. No visible deformities. No bony tenderness.   Assessment:   1. Onychomycosis   2. Porokeratosis   3. Coagulation defect [facility])    Plan:  Patient was evaluated and treated and all questions answered.  Onychomycosis, porokeratosis, coag defect -Routine foot care services as below -Would consider CMOs if covered by secondary to offload painful callused areas.  Procedure: Nail Debridement Rationale: Patient meets criteria for routine foot care due to coag defect Type of Debridement: manual, sharp debridement. Instrumentation: Nail nipper, rotary burr. Number of Nails: 10   Procedure: Paring of Lesion Rationale: painful hyperkeratotic lesion Type of Debridement: manual, sharp debridement. Instrumentation: 312 blade Number of Lesions: 3   No follow-ups on file.

## 2019-02-20 ENCOUNTER — Other Ambulatory Visit: Payer: Self-pay | Admitting: Cardiology

## 2019-02-25 ENCOUNTER — Telehealth: Payer: Self-pay | Admitting: Internal Medicine

## 2019-02-25 NOTE — Telephone Encounter (Signed)
Xolair Prefilled Syringe Order: 150mg  Prefilled Syringe:  #1 75mg  Prefilled Syringe: N/A Ordered Date: 02/25/2019 Expected date of arrival: 02/26/2019 Ordered by: Desmond Dike, [place]: Nigel Mormon

## 2019-02-26 NOTE — Telephone Encounter (Signed)
Xolair Prefilled Syringe Received:  150mg  Prefilled Syringe >> quantity 1, lot # [id number], exp date 11/2019 Medication arrival date: 02/26/2019 Received by: TBS

## 2019-03-06 ENCOUNTER — Ambulatory Visit: Payer: Medicare Other | Admitting: Cardiology

## 2019-03-06 NOTE — Progress Notes (Deleted)
Cardiology Office Note:    Date:  03/06/2019   ID:  Debra Byrd, DOB 08/17/1946, MRN [id number]  PCP:  Leane CallNodal, Jr Reinaldo, PA-C  Cardiologist:  Norman HerrlichBrian [name], MD    Referring MD: Leane CallNodal, Jr Reinaldo, PA-C    ASSESSMENT:    1. APC (atrial premature contractions)   2. Non-rheumatic mitral regurgitation   3. Stenosis of right carotid artery   4. Essential hypertension   5. Mixed hyperlipidemia    PLAN:    In order of problems listed above:  1. APC - Recent labs 02/15/19 with normal TSH and electrolytes.  2. Mild MR -  3. Stenosis RCA -last MRA5/04/2017 with "mild right ICA origin atherosclerosis suspected and as before does not appear hemodynamically significant-numerically estimated at 50% or less" 4. HTN -  5. HLD - Lipid panel from 02/15/2019 available via care everywhere total cholesterol 142, HDL 56, LDL 65, triglycerides 94  Next appointment: ***   Medication Adjustments/Labs and Tests Ordered: Current medicines are reviewed at length with the patient today.  Concerns regarding medicines are outlined above.  No orders of the defined types were placed in this encounter.  No orders of the defined types were placed in this encounter.   Chief Complaint: 73 yo female presents for 6 month follow up of DLD, HTN, mild MR, APC, right ICA stenosis.   History of Present Illness:    Debra Byrd is a 73 y.o. female with a hx of asthma, right ICA stenosis, D LD, HTN, mild MR, palpitations with APCs last seen 08/08/2018.   Compliance with diet, lifestyle and medications: *** Past Medical History:  Diagnosis Date  . Allergic rhinitis, cause unspecified   . Asthma   . Chronic airway obstruction, not elsewhere classified   . Coronary atherosclerosis of unspecified type of vessel, native or graft   . Mitral valve disorders(424.0)   . Mononeuritis of unspecified site   . Myalgia and myositis, unspecified   . Peripheral vascular disease, unspecified (HCC)   . Sleep disturbance,  unspecified   . Unspecified glaucoma(365.9)     Past Surgical History:  Procedure Laterality Date  . ROTATOR CUFF REPAIR      Current Medications: No outpatient medications have been marked as taking for the 03/06/19 encounter (Appointment) with Baldo DaubMunley, Brian J, MD.     Allergies:   Iodinated diagnostic agents, Azithromycin, Iodine, Levofloxacin, Montelukast sodium, Cefaclor, Penicillins, Sulfa antibiotics, and Sulfonamide derivatives   Social History   Socioeconomic History  . Marital status: Married    Spouse name: Not on file  . Number of children: Not on file  . Years of education: Not on file  . Highest education level: Not on file  Occupational History  . Not on file  Social Needs  . Financial resource strain: Not on file  . Food insecurity    Worry: Not on file    Inability: Not on file  . Transportation needs    Medical: Not on file    Non-medical: Not on file  Tobacco Use  . Smoking status: Never Smoker  . Smokeless tobacco: Never Used  Substance and Sexual Activity  . Alcohol use: No  . Drug use: No  . Sexual activity: Not on file  Lifestyle  . Physical activity    Days per week: Not on file    Minutes per session: Not on file  . Stress: Not on file  Relationships  . Social connections    Talks on phone: Not on file  Gets together: Not on file    Attends religious service: Not on file    Active member of club or organization: Not on file    Attends meetings of clubs or organizations: Not on file    Relationship status: Not on file  Other Topics Concern  . Not on file  Social History Narrative  . Not on file     Family History: The patient's family history includes Emphysema in her maternal uncle; Heart disease in her father. ROS:   Please see the history of present illness.    All other systems reviewed and are negative.  EKGs/Labs/Other Studies Reviewed:    The following studies were reviewed today:  Neck MRA 12/28/16 IMPRESSION: 1.  Stable MRA appearance of the neck since 2016. 2. Mild right ICA origin atherosclerosis suspected and as before does not appear hemodynamically significant - numerically estimated at 50% or less. 3. Stable and negative left cervical carotid. 4. Stable and negative vertebral arteries.  EKG:  EKG ordered today and personally reviewed.  The ekg ordered today demonstrates ***  Recent Labs: Labs available for review from 02/15/2019 via care everywhere.  A1c 5.5, creatinine 0.82, GFR 72, potassium 4.2, AST 26, ALT 19, TSH 1.346, hemoglobin 13.2.  Recent Lipid Panel Lipid panel from 02/15/2019 available via care everywhere total cholesterol 142, HDL 56, LDL 65, triglycerides 94   Physical Exam:    VS:  There were no vitals taken for this visit.    Wt Readings from Last 3 Encounters:  02/14/19 169 lb 3.2 oz (76.7 kg)  09/04/18 165 lb (74.8 kg)  08/09/18 167 lb 3.2 oz (75.8 kg)     GEN: *** Well nourished, well developed in no acute distress HEENT: Normal NECK: No JVD; No carotid bruits LYMPHATICS: No lymphadenopathy CARDIAC: ***RRR, no murmurs, rubs, gallops RESPIRATORY:  Clear to auscultation without rales, wheezing or rhonchi  ABDOMEN: Soft, non-tender, non-distended MUSCULOSKELETAL:  No edema; No deformity  SKIN: Warm and dry NEUROLOGIC:  Alert and oriented x 3 PSYCHIATRIC:  Normal affect    Signed, Shirlee More, MD  03/06/2019 8:35 AM    [place]

## 2019-03-08 ENCOUNTER — Ambulatory Visit: Payer: Medicare Other

## 2019-03-08 ENCOUNTER — Ambulatory Visit (INDEPENDENT_AMBULATORY_CARE_PROVIDER_SITE_OTHER): Payer: Medicare Other

## 2019-03-08 ENCOUNTER — Other Ambulatory Visit: Payer: Self-pay

## 2019-03-08 DIAGNOSIS — J4540 Moderate persistent asthma, uncomplicated: Secondary | ICD-10-CM

## 2019-03-08 MED ORDER — OMALIZUMAB 150 MG/ML ~~LOC~~ SOSY
150.0000 mg | PREFILLED_SYRINGE | Freq: Once | SUBCUTANEOUS | Status: AC
  Administered 2019-03-08: 150 mg via SUBCUTANEOUS

## 2019-03-08 NOTE — Progress Notes (Signed)
All questions were answered by the patient before medication was administered. Have you been hospitalized in the last 10 days? No Do you have a fever? No Do you have a cough? No Do you have a headache or sore throat? No  

## 2019-03-22 ENCOUNTER — Other Ambulatory Visit: Payer: Self-pay | Admitting: Cardiology

## 2019-03-26 ENCOUNTER — Encounter: Payer: Self-pay | Admitting: Cardiology

## 2019-03-26 ENCOUNTER — Ambulatory Visit (INDEPENDENT_AMBULATORY_CARE_PROVIDER_SITE_OTHER): Payer: Medicare Other | Admitting: Cardiology

## 2019-03-26 ENCOUNTER — Telehealth: Payer: Self-pay | Admitting: Internal Medicine

## 2019-03-26 ENCOUNTER — Other Ambulatory Visit: Payer: Self-pay

## 2019-03-26 VITALS — BP 130/84 | HR 56 | Ht 61.0 in | Wt 170.0 lb

## 2019-03-26 DIAGNOSIS — I10 Essential (primary) hypertension: Secondary | ICD-10-CM | POA: Diagnosis not present

## 2019-03-26 DIAGNOSIS — I491 Atrial premature depolarization: Secondary | ICD-10-CM | POA: Diagnosis not present

## 2019-03-26 DIAGNOSIS — E782 Mixed hyperlipidemia: Secondary | ICD-10-CM

## 2019-03-26 DIAGNOSIS — I6521 Occlusion and stenosis of right carotid artery: Secondary | ICD-10-CM

## 2019-03-26 DIAGNOSIS — I340 Nonrheumatic mitral (valve) insufficiency: Secondary | ICD-10-CM | POA: Diagnosis not present

## 2019-03-26 MED ORDER — TELMISARTAN 20 MG PO TABS: 20.0000 mg | ORAL_TABLET | Freq: Every day | ORAL | 1 refills | Status: AC

## 2019-03-26 NOTE — Patient Instructions (Signed)
Medication Instructions:  Your physician has recommended you make the following change in your medication:   STOP: VALSARTAN  START: Telmisartan 20 mg : Take 1 tab daily at bedtime  Start 03/27/19  If you need a refill on your cardiac medications before your next appointment, please call your pharmacy.   Lab work: None If you have labs (blood work) drawn today and your tests are completely normal, you will receive your results only by: Marland Kitchen MyChart Message (if you have MyChart) OR . A paper copy in the mail If you have any lab test that is abnormal or we need to change your treatment, we will call you to review the results.  Testing/Procedures: None  Follow-Up: At [facility], you and your health needs are our priority.  As part of our continuing mission to provide you with exceptional heart care, we have created designated Provider Care Teams.  These Care Teams include your primary Cardiologist (physician) and Advanced Practice Providers (APPs -  Physician Assistants and Nurse Practitioners) who all work together to provide you with the care you need, when you need it. You will need a follow up appointment in 8 months.  Please call our office 2 months in advance to schedule this appointment.  You may see No primary care provider on file. or another member of our [employer] Provider Team in [place]: Jenne Campus, MD . Jyl Heinz, MD  Any Other Special Instructions Will Be Listed Below (If Applicable).

## 2019-03-26 NOTE — Telephone Encounter (Signed)
Xolair Prefilled Syringe Order: 150mg  Prefilled Syringe:  #1 75mg  Prefilled Syringe: N/A Ordered Date: 03/26/19  Expected date of arrival: 03/27/19 Ordered by: Parke Poisson, [place]

## 2019-03-26 NOTE — Progress Notes (Signed)
Cardiology Office Note:    Date:  03/26/2019   ID:  Debra Byrd, DOB 08/17/1946, MRN [id number]  PCP:  Leane CallNodal, Jr Reinaldo, PA-C  Cardiologist:  Norman HerrlichBrian Mechell Girgis, MD    Referring MD: Leane CallNodal, Jr Reinaldo, PA-C    ASSESSMENT:    1. APC (atrial premature contractions)   2. Non-rheumatic mitral regurgitation   3. Stenosis of right carotid artery   4. Essential hypertension   5. Mixed hyperlipidemia    PLAN:    In order of problems listed above:  1. APC - Reports intermittent palpitations "every once in awhile" that do not bother her. No SOB or chest pain associated with these. Improved dramatically since her asthma has been under control.  2. Non-rheumatic MR - No murmur on exam. No SOB, palpitations, edema. No indication for repeat echocardiogram at this time. 3. Stenosis right carotid artery - May 2018 MRA with mild stenosis <50%. No syncope, TIA. Will plan for repeat MR around April as I would like to keep her out of the hospital for testing in the setting of COVID19. 4. HTN - She has been changed from Losartan to Valsartan due to availability by her PCP. She reports her BP is running higher with systolic readings in the 130s since this change. She is interested in taking her blood pressure medication at night. Will stop Valsartan and start Telmisartan 20mg  QHS.  5. HLD - Lipid profile 02/15/19 with LDL 65. Continue Crestor and Plavix. At goal of LDL <70.   Next appointment: 8 months in April.  Medication Adjustments/Labs and Tests Ordered: Current medicines are reviewed at length with the patient today.  Concerns regarding medicines are outlined above.  No orders of the defined types were placed in this encounter.  Meds ordered this encounter  Medications  . telmisartan (MICARDIS) 20 MG tablet    Sig: Take 1 tablet (20 mg total) by mouth at bedtime.    Dispense:  90 tablet    Refill:  1   Chief Complaint  Patient presents with  . Follow-up    carotid stenosis  . Hypertension   . Hyperlipidemia  . Mitral Regurgitation  . Palpitations    with APC's   Chief Complaint: 73 yo female with PMH HTN, mild MR, palpitations, APCs presents for 6 month follow up.   History of Present Illness:    Debra Byrd is a 73 y.o. female with a cardiovascular history of R ICA stenosis, DLD, HTN, mild MR, palpitations and APCs last seen 08/08/18. Non cardiac history includes chronic obstructive asthma, chronic back pain. She follows with pulmonology.   She reports feeling well. Reports no asthma exacerbations over the past few years. She has no complaints regarding her heart. No chest pain, shortness of breath, palpitations. She did have some dizziness when switched from Losartan to Valsartan due to pharmacy availability, but this self resolved after a few days. She is concerned that her BP readings at home since the change have been systolic 130s. She is also interested in taking her blood pressure medication in the evening as she read that this may be beneficial. We reviewed labs done at her PCP together which were all very good.  Compliance with diet, lifestyle and medications: Yes  Past Medical History:  Diagnosis Date  . Allergic rhinitis, cause unspecified   . Asthma   . Chronic airway obstruction, not elsewhere classified   . Coronary atherosclerosis of unspecified type of vessel, native or graft   . Mitral valve disorders(424.0)   .  Mononeuritis of unspecified site   . Myalgia and myositis, unspecified   . Peripheral vascular disease, unspecified (HCC)   . Sleep disturbance, unspecified   . Unspecified glaucoma(365.9)    Past Surgical History:  Procedure Laterality Date  . ROTATOR CUFF REPAIR     Current Medications: Current Meds  Medication Sig  . albuterol (PROAIR HFA) 108 (90 Base) MCG/ACT inhaler Inhale 2 puffs into the lungs every 6 (six) hours as needed for wheezing.  Marland Kitchen. albuterol (PROVENTIL) (2.5 MG/3ML) 0.083% nebulizer solution Take 3 mLs (2.5 mg total) by  nebulization 4 (four) times daily as needed for wheezing.  . Ascorbic Acid (VITAMIN C) 1000 MG tablet Take 1,000 mg by mouth daily.  . Calcium Carbonate-Vit D-Min (CALCIUM 1200 PO) Take 1,200 mg by mouth daily.  . calcium gluconate 500 MG tablet Take 500 mg by mouth 2 (two) times daily.    . cholecalciferol (VITAMIN D) 1000 UNITS tablet Take 1,000 Units by mouth daily.  . clindamycin (CLEOCIN) 300 MG capsule Take 300 mg by mouth daily as needed (Dental Work). 1 hour before dental work  . clopidogrel (PLAVIX) 75 MG tablet TAKE 1 TABLET BY MOUTH EVERY DAY  . Coenzyme Q10 (COQ10) 100 MG CAPS Take 1 capsule by mouth daily.  Marland Kitchen. dexlansoprazole (DEXILANT) 60 MG capsule Take 60 mg by mouth daily.    Marland Kitchen. doxycycline (ADOXA) 50 MG tablet Take 50 mg by mouth daily.  . DUREZOL 0.05 % EMUL   . EPINEPHrine 0.3 mg/0.3 mL IJ SOAJ injection   . fish oil-omega-3 fatty acids 1000 MG capsule 4 by mouth daily   . fluticasone (FLONASE) 50 MCG/ACT nasal spray Place 2 sprays into both nostrils daily.  . Fluticasone-Salmeterol (ADVAIR DISKUS) 100-50 MCG/DOSE AEPB Inhale 1 puff and rinse mouth, twice daily  . gabapentin (NEURONTIN) 300 MG capsule Take 300 mg by mouth at bedtime.  . Latanoprost (XELPROS) 0.005 % EMUL Apply to eye.  . loratadine (CLARITIN) 10 MG tablet Take 1 tablet (10 mg total) by mouth daily.  . rosuvastatin (CRESTOR) 5 MG tablet Take 1 tablet (5 mg total) by mouth daily.  Marland Kitchen. tobramycin (TOBREX) 0.3 % ophthalmic solution INSTILL 1 DROP INTO RIGHT EYE 4 TIMES A DAY  . vitamin B-12 (CYANOCOBALAMIN) 1000 MCG tablet Take 1,000 mcg by mouth daily.  . [DISCONTINUED] valsartan (DIOVAN) 40 MG tablet Take 40 mg by mouth daily.     Allergies:   Iodinated diagnostic agents, Azithromycin, Iodine, Levofloxacin, Montelukast sodium, Cefaclor, Penicillins, Sulfa antibiotics, and Sulfonamide derivatives   Social History   Socioeconomic History  . Marital status: Married    Spouse name: Not on file  . Number of  children: Not on file  . Years of education: Not on file  . Highest education level: Not on file  Occupational History  . Not on file  Social Needs  . Financial resource strain: Not on file  . Food insecurity    Worry: Not on file    Inability: Not on file  . Transportation needs    Medical: Not on file    Non-medical: Not on file  Tobacco Use  . Smoking status: Never Smoker  . Smokeless tobacco: Never Used  Substance and Sexual Activity  . Alcohol use: No  . Drug use: No  . Sexual activity: Not on file  Lifestyle  . Physical activity    Days per week: Not on file    Minutes per session: Not on file  . Stress: Not on file  Relationships  . Social Herbalist on phone: Not on file    Gets together: Not on file    Attends religious service: Not on file    Active member of club or organization: Not on file    Attends meetings of clubs or organizations: Not on file    Relationship status: Not on file  Other Topics Concern  . Not on file  Social History Narrative  . Not on file     Family History: The patient's family history includes Emphysema in her maternal uncle; Heart disease in her father.  ROS:   Review of Systems  Constitution: Negative for chills, fever and malaise/fatigue.  Cardiovascular: Positive for palpitations ("rarely"). Negative for chest pain, dyspnea on exertion, irregular heartbeat, leg swelling, near-syncope and syncope.  Respiratory: Negative for cough, shortness of breath and wheezing.   Gastrointestinal: Negative for nausea and vomiting.  Neurological: Negative for dizziness, light-headedness and weakness.   Please see the history of present illness.    All other systems reviewed and are negative.  EKGs/Labs/Other Studies Reviewed:    The following studies were reviewed today:  EKG:  No EKG today.  MRA neck with and without contrast May 2018: mild right internal carotid artery stenosis less than 50%.  Recent Labs:  02/09/2019  via [facility]   cholesterol 142, LDL 65, HDL 56,   CMP is normal including liver function renal function potassium 4.2   TSH normal and CBC hemoglobin 13.2  A1c 5.5  08/08/2018: ALT 30; BUN 12; Creatinine, Ser 0.73; Potassium 4.6; Sodium 143   Recent Lipid Panel 02/15/2019 via [facility]: Cholesterol 142, LDL 65, HDL 56,     Component Value Date/Time   CHOL 145 08/08/2018 0940   TRIG 85 08/08/2018 0940   HDL 67 08/08/2018 0940   CHOLHDL 2.2 08/08/2018 0940   LDLCALC 61 08/08/2018 0940    Physical Exam:    VS:  BP 130/84 (BP Location: Right Arm, Patient Position: Sitting, Cuff Size: Normal)   Pulse (!) 56   Ht 5\' 1"  (1.549 m)   Wt 170 lb (77.1 kg)   SpO2 96%   BMI 32.12 kg/m     Wt Readings from Last 3 Encounters:  03/26/19 170 lb (77.1 kg)  02/14/19 169 lb 3.2 oz (76.7 kg)  09/04/18 165 lb (74.8 kg)     GEN:  Well nourished, well developed in no acute distress HEENT: Normal NECK: No JVD; No carotid bruits LYMPHATICS: No lymphadenopathy CARDIAC: RRR, no murmurs, rubs, gallops RESPIRATORY:  Clear to auscultation without rales, wheezing or rhonchi  ABDOMEN: Soft, non-tender, non-distended MUSCULOSKELETAL:  No edema; No deformity  SKIN: Warm and dry NEUROLOGIC:  Alert and oriented x 3 PSYCHIATRIC:  Normal affect    Signed, Shirlee More, MD  03/26/2019 4:30 PM    [place]

## 2019-03-27 NOTE — Telephone Encounter (Signed)
Xolair Prefilled Syringe Received:  150mg  Prefilled Syringe >> quantity #1, lot # [id number], exp date 12/21/2019 75mg  Prefilled Syringe >> N/A Medication arrival date: 03/27/19  Received by: Parke Poisson, CMA

## 2019-04-04 ENCOUNTER — Telehealth: Payer: Self-pay | Admitting: Internal Medicine

## 2019-04-04 NOTE — Telephone Encounter (Signed)
Called and spoke with Patient.  Patient requested to move injection appointment up if possible.  Injection changed to 0930, 04/05/19, Xolair.  Covid questions asked and negative.  Patient aware to bring Epipen to injection.  Nothing further at this time.

## 2019-04-05 ENCOUNTER — Other Ambulatory Visit: Payer: Self-pay

## 2019-04-05 ENCOUNTER — Ambulatory Visit: Payer: Medicare Other

## 2019-04-05 ENCOUNTER — Ambulatory Visit (INDEPENDENT_AMBULATORY_CARE_PROVIDER_SITE_OTHER): Payer: Medicare Other

## 2019-04-05 DIAGNOSIS — J4540 Moderate persistent asthma, uncomplicated: Secondary | ICD-10-CM | POA: Diagnosis not present

## 2019-04-05 MED ORDER — OMALIZUMAB 150 MG/ML ~~LOC~~ SOSY
150.0000 mg | PREFILLED_SYRINGE | Freq: Once | SUBCUTANEOUS | Status: AC
  Administered 2019-04-05: 10:00:00 150 mg via SUBCUTANEOUS

## 2019-04-05 NOTE — Progress Notes (Signed)
Have you been hospitalized within the last 10 days?  No Do you have a fever?  No Do you have a cough?  No Do you have a headache or sore throat? No Do you have your Epi Pen visible and is it within date?  Yes 

## 2019-04-18 ENCOUNTER — Other Ambulatory Visit: Payer: Self-pay | Admitting: Cardiology

## 2019-04-22 ENCOUNTER — Telehealth: Payer: Self-pay | Admitting: Internal Medicine

## 2019-04-22 NOTE — Telephone Encounter (Signed)
Xolair Prefilled Syringe Order: 150mg  Prefilled Syringe:  #1 75mg  Prefilled Syringe: #N/A Ordered Date: 04/22/2019 Expected date of arrival: 04/23/2019 Ordered by: [facility]

## 2019-04-23 NOTE — Telephone Encounter (Signed)
Xolair Prefilled Syringe Received:  150mg  Prefilled Syringe >> quantity 1, lot # [id number], exp date 01/21/2020 75mg  Prefilled Syringe >> N/A Medication arrival date: 04/23/19 Received by: Lattie Haw T,LPN

## 2019-05-03 ENCOUNTER — Ambulatory Visit (INDEPENDENT_AMBULATORY_CARE_PROVIDER_SITE_OTHER): Payer: Medicare Other

## 2019-05-03 ENCOUNTER — Other Ambulatory Visit: Payer: Self-pay

## 2019-05-03 ENCOUNTER — Other Ambulatory Visit: Payer: Self-pay | Admitting: Cardiology

## 2019-05-03 DIAGNOSIS — J4540 Moderate persistent asthma, uncomplicated: Secondary | ICD-10-CM | POA: Diagnosis not present

## 2019-05-03 MED ORDER — OMALIZUMAB 150 MG/ML ~~LOC~~ SOSY
150.0000 mg | PREFILLED_SYRINGE | Freq: Once | SUBCUTANEOUS | Status: AC
  Administered 2019-05-03: 150 mg via SUBCUTANEOUS

## 2019-05-03 MED ORDER — OMALIZUMAB 150 MG ~~LOC~~ SOLR: 150.0000 mg | SUBCUTANEOUS | Status: DC

## 2019-05-03 NOTE — Progress Notes (Signed)
Have you been hospitalized within the last 10 days?  No Do you have a fever?  No Do you have a cough?  No Do you have a headache or sore throat? No Do you have your Epi Pen visible and is it within date?  Yes 

## 2019-05-20 ENCOUNTER — Telehealth: Payer: Self-pay | Admitting: Internal Medicine

## 2019-05-20 NOTE — Telephone Encounter (Signed)
Spoke with pt. She will get her flu shot when she comes in for her Xolair next week. Nothing further is needed.

## 2019-05-20 NOTE — Telephone Encounter (Signed)
Xolair Prefilled Syringe Order: 150mg  Prefilled Syringe:  #1 75mg  Prefilled Syringe: N/A Ordered Date: 05/20/2019 Expected date of arrival: 05/21/2019 Ordered by: Desmond Dike, [place]  [facility]: [facility]

## 2019-05-21 ENCOUNTER — Other Ambulatory Visit: Payer: Self-pay

## 2019-05-21 ENCOUNTER — Ambulatory Visit (INDEPENDENT_AMBULATORY_CARE_PROVIDER_SITE_OTHER): Payer: Medicare Other | Admitting: Podiatry

## 2019-05-21 DIAGNOSIS — D689 Coagulation defect, unspecified: Secondary | ICD-10-CM | POA: Diagnosis not present

## 2019-05-21 DIAGNOSIS — Q828 Other specified congenital malformations of skin: Secondary | ICD-10-CM

## 2019-05-21 DIAGNOSIS — B351 Tinea unguium: Secondary | ICD-10-CM | POA: Diagnosis not present

## 2019-05-21 NOTE — Telephone Encounter (Signed)
Xolair Prefilled Syringe Received:  150mg  Prefilled Syringe >> quantity #1, lot # [id number], exp date 01/2020 75mg  Prefilled Syringe >> N/A Medication arrival date: 05/21/2019 Received by: Desmond Dike, [place]

## 2019-05-21 NOTE — Progress Notes (Signed)
Subjective:  Patient ID: Debra Byrd, female    DOB: [birth year],  MRN: [id number]  Chief Complaint  Patient presents with  . debride    ROutine foot care -pt on Plavix -pt denies any pedal complaints    73 y.o. female presents with the above complaint.  Reports painfully elongated nails to both feet.  Review of Systems: Negative except as noted in the HPI. Denies N/V/F/Ch.  Past Medical History:  Diagnosis Date  . Allergic rhinitis, cause unspecified   . Asthma   . Chronic airway obstruction, not elsewhere classified   . Coronary atherosclerosis of unspecified type of vessel, native or graft   . Mitral valve disorders(424.0)   . Mononeuritis of unspecified site   . Myalgia and myositis, unspecified   . Peripheral vascular disease, unspecified (HCC)   . Sleep disturbance, unspecified   . Unspecified glaucoma(365.9)     Current Outpatient Medications:  .  albuterol (PROAIR HFA) 108 (90 Base) MCG/ACT inhaler, Inhale 2 puffs into the lungs every 6 (six) hours as needed for wheezing., Disp: 3 Inhaler, Rfl: 3 .  albuterol (PROVENTIL) (2.5 MG/3ML) 0.083% nebulizer solution, Take 3 mLs (2.5 mg total) by nebulization 4 (four) times daily as needed for wheezing., Disp: 120 vial, Rfl: 12 .  Ascorbic Acid (VITAMIN C) 1000 MG tablet, Take 1,000 mg by mouth daily., Disp: , Rfl:  .  Calcium Carbonate-Vit D-Min (CALCIUM 1200 PO), Take 1,200 mg by mouth daily., Disp: , Rfl:  .  calcium gluconate 500 MG tablet, Take 500 mg by mouth 2 (two) times daily.  , Disp: , Rfl:  .  cholecalciferol (VITAMIN D) 1000 UNITS tablet, Take 1,000 Units by mouth daily., Disp: , Rfl:  .  clindamycin (CLEOCIN) 300 MG capsule, Take 300 mg by mouth daily as needed (Dental Work). 1 hour before dental work, Disp: , Rfl: 0 .  clopidogrel (PLAVIX) 75 MG tablet, TAKE 1 TABLET BY MOUTH EVERY DAY, Disp: 90 tablet, Rfl: 1 .  Coenzyme Q10 (COQ10) 100 MG CAPS, Take 1 capsule by mouth daily., Disp: , Rfl:  .  dexlansoprazole  (DEXILANT) 60 MG capsule, Take 60 mg by mouth daily.  , Disp: , Rfl:  .  doxycycline (ADOXA) 50 MG tablet, Take 50 mg by mouth daily., Disp: , Rfl:  .  DUREZOL 0.05 % EMUL, , Disp: , Rfl:  .  EPINEPHrine 0.3 mg/0.3 mL IJ SOAJ injection, , Disp: , Rfl:  .  fish oil-omega-3 fatty acids 1000 MG capsule, 4 by mouth daily , Disp: , Rfl:  .  fluticasone (FLONASE) 50 MCG/ACT nasal spray, Place 2 sprays into both nostrils daily., Disp: 3 g, Rfl: 3 .  Fluticasone-Salmeterol (ADVAIR DISKUS) 100-50 MCG/DOSE AEPB, Inhale 1 puff and rinse mouth, twice daily, Disp: 3 each, Rfl: 3 .  gabapentin (NEURONTIN) 300 MG capsule, Take 300 mg by mouth at bedtime., Disp: , Rfl: 3 .  Latanoprost (XELPROS) 0.005 % EMUL, Apply to eye., Disp: , Rfl:  .  loratadine (CLARITIN) 10 MG tablet, Take 1 tablet (10 mg total) by mouth daily., Disp: 90 tablet, Rfl: 3 .  rosuvastatin (CRESTOR) 5 MG tablet, TAKE 1 TABLET BY MOUTH EVERY DAY, Disp: 90 tablet, Rfl: 1 .  telmisartan (MICARDIS) 20 MG tablet, Take 1 tablet (20 mg total) by mouth at bedtime., Disp: 90 tablet, Rfl: 1 .  tobramycin (TOBREX) 0.3 % ophthalmic solution, INSTILL 1 DROP INTO RIGHT EYE 4 TIMES A DAY, Disp: , Rfl:  .  vitamin B-12 (CYANOCOBALAMIN) 1000  MCG tablet, Take 1,000 mcg by mouth daily., Disp: , Rfl:   Social History   Tobacco Use  Smoking Status Never Smoker  Smokeless Tobacco Never Used    Allergies  Allergen Reactions  . Iodinated Diagnostic Agents Shortness Of Breath and Swelling    Swelling in facial/No issues with shellfish   . Azithromycin Hives    No problem with Biaxin  . Iodine Swelling  . Levofloxacin     Tendon tears  . Montelukast Sodium Swelling  . Cefaclor Rash  . Penicillins Swelling  . Sulfa Antibiotics Rash  . Sulfonamide Derivatives Rash   Objective:  There were no vitals filed for this visit. There is no height or weight on file to calculate BMI. Constitutional Well developed. Well nourished.  Vascular Dorsalis pedis  pulses palpable bilaterally. Posterior tibial pulses palpable bilaterally. Capillary refill normal to all digits.  No cyanosis or clubbing noted. Pedal hair growth normal.  Neurologic Normal speech. Oriented to person, place, and time. Epicritic sensation to light touch grossly present bilaterally.  Dermatologic Nails elongated dystrophic pain to palpation No open wounds. Hyperkeratosis submet bilat  Orthopedic: Normal joint ROM without pain or crepitus bilaterally. No visible deformities. No bony tenderness.   Radiographs: None Assessment:   1. Onychomycosis   2. 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  Onychomycosis with coagulation defect, calluses -Nails palliatively debridement as below -Educated on self-care  Procedure: Nail Debridement Rationale: Pain Type of Debridement: manual, sharp debridement. Instrumentation: Nail nipper, rotary burr. Number of Nails: 10  Procedure: Paring of Lesion Rationale: painful hyperkeratotic lesion Type of Debridement: manual, sharp debridement. Instrumentation: 312 blade Number of Lesions: 2    No follow-ups on file.

## 2019-05-28 ENCOUNTER — Encounter: Payer: Self-pay | Admitting: Orthopaedic Surgery

## 2019-05-28 ENCOUNTER — Ambulatory Visit (INDEPENDENT_AMBULATORY_CARE_PROVIDER_SITE_OTHER): Payer: Medicare Other

## 2019-05-28 ENCOUNTER — Other Ambulatory Visit: Payer: Self-pay

## 2019-05-28 ENCOUNTER — Ambulatory Visit (INDEPENDENT_AMBULATORY_CARE_PROVIDER_SITE_OTHER): Payer: Medicare Other | Admitting: Orthopaedic Surgery

## 2019-05-28 VITALS — BP 149/85 | HR 64 | Ht 61.0 in | Wt 170.0 lb

## 2019-05-28 DIAGNOSIS — M25552 Pain in left hip: Secondary | ICD-10-CM

## 2019-05-28 DIAGNOSIS — M545 Low back pain, unspecified: Secondary | ICD-10-CM | POA: Insufficient documentation

## 2019-05-28 DIAGNOSIS — M7062 Trochanteric bursitis, left hip: Secondary | ICD-10-CM

## 2019-05-28 DIAGNOSIS — I6521 Occlusion and stenosis of right carotid artery: Secondary | ICD-10-CM | POA: Diagnosis not present

## 2019-05-28 DIAGNOSIS — M461 Sacroiliitis, not elsewhere classified: Secondary | ICD-10-CM

## 2019-05-28 DIAGNOSIS — M797 Fibromyalgia: Secondary | ICD-10-CM

## 2019-05-28 DIAGNOSIS — M4316 Spondylolisthesis, lumbar region: Secondary | ICD-10-CM

## 2019-05-28 DIAGNOSIS — M47818 Spondylosis without myelopathy or radiculopathy, sacral and sacrococcygeal region: Secondary | ICD-10-CM

## 2019-05-28 HISTORY — DX: Fibromyalgia: M79.7

## 2019-05-28 HISTORY — DX: Sacroiliitis, not elsewhere classified: M46.1

## 2019-05-28 HISTORY — DX: Trochanteric bursitis, left hip: M70.62

## 2019-05-28 HISTORY — DX: Spondylolisthesis, lumbar region: M43.16

## 2019-05-28 HISTORY — DX: Low back pain, unspecified: M54.50

## 2019-05-28 NOTE — Progress Notes (Signed)
Office Visit Note   Patient: Debra Byrd           Date of Birth: [birth year]           MRN: [id number] Visit Date: 05/28/2019              Requested by: Maggie Schwalbe, PA-C [place],  [place] [id number] PCP: Maggie Schwalbe, PA-C   Assessment & Plan: Visit Diagnoses:  1. Pain in left hip   2. Fibromyalgia   3. Trochanteric bursitis, left hip   4. Spondylolisthesis, lumbar region   5. SI joint arthritis     Plan:  #1: At this time she would like to stay conservative.  She did not want an injection into the trochanteric region today. #2: I have discussed topical use of Voltaren gel.  PACS system is got very careful with her kidneys so this will hopefully be the best alternative. #3: Prescription physical therapy at [facility] area. Release.    Follow-Up Instructions: Return if symptoms worsen or fail to improve.   Orders:  Orders Placed This Encounter  Procedures  . XR Lumbar Spine 2-3 Views  . XR HIP UNILAT W OR W/O PELVIS 2-3 VIEWS LEFT  . Ambulatory referral to Physical Therapy   No orders of the defined types were placed in this encounter.     Procedures: No procedures performed   Clinical Data: No additional findings.   Subjective: Chief Complaint  Patient presents with  . Left Hip - Pain   HPI Patient presents today for lower back and left hip pain. She said that she has seen Dr.Whitfield in the past, but it has been a few years. No known injury that she is aware of. Her pain is located in her lower back and lateral left hip. She has pain that radiates down her leg from trochanter to proximal 1/3 of femur.  None distally She has peripheral neuropathy in both feet. She takes Gabapentin and tylenol if needed. No previous back or hip surgery. But has had LBP previously    Review of Systems  Constitutional: Negative for fatigue.  HENT: Negative for ear pain.   Eyes: Positive for pain.  Respiratory: Negative for shortness of  breath.   Cardiovascular: Negative for leg swelling.  Gastrointestinal: Negative for constipation and diarrhea.  Endocrine: Negative for cold intolerance and heat intolerance.  Genitourinary: Negative for difficulty urinating.  Musculoskeletal: Negative for joint swelling.  Skin: Negative for rash.  Allergic/Immunologic: Negative for food allergies.  Neurological: Negative for weakness.  Hematological: Bruises/bleeds easily.  Psychiatric/Behavioral: Negative for sleep disturbance.     Objective: Vital Signs: BP (!) 149/85   Pulse 64   Ht 5\' 1"  (1.549 m)   Wt 170 lb (77.1 kg)   BMI 32.12 kg/m   Physical Exam Constitutional:      Appearance: She is well-developed.  Eyes:     Pupils: Pupils are equal, round, and reactive to light.  Pulmonary:     Effort: Pulmonary effort is normal.  Skin:    General: Skin is warm and dry.  Neurological:     Mental Status: She is alert and oriented to person, place, and time.  Psychiatric:        Behavior: Behavior normal.     Ortho Exam  Negative SLR.  Tender exquisitely over her left trochanteric area.  Deep tendon reflexes are 2+ bilateral symmetric in the lower extremities.  Good motion of internal and external rotation  of the left hip without referred pain.  Sensations intact to light touch.  Good strength bilaterally.  Neurovascular intact distally.    Specialty Comments:  No specialty comments available.  Imaging: Xr Hip Unilat W Or W/o Pelvis 2-3 Views Left  Result Date: 05/28/2019 X-ray of the left hip reveals maintenance of joint space.  Maybe a little bit of narrowing superiorly.  Otherwise unremarkable.  Xr Lumbar Spine 2-3 Views  Result Date: 05/28/2019 2 view lumbar spine reveals a degenerative scoliosis about 12 degrees apex to the left.  She also has some SI joint changes noted bilaterally.   Mild joint space narrowing grade 1 spondylolisthesis at L5-L4.  Degenerative disc disease L1-2 and 2 3.  EXAM: MRI LUMBAR  SPINE WITHOUT CONTRAST  11/20/2014  TECHNIQUE: Multiplanar, multisequence MR imaging of the lumbar spine was performed. No intravenous contrast was administered.  COMPARISON:  CT abdomen and pelvis 05/13/2005  FINDINGS: For the purposes of this dictation, the lowest well-formed intervertebral disc space is presumed to be L5-S1.  There is mild lumbar levoscoliosis. Grade 1 anterolisthesis of L4 on L5 measures 6 mm. Vertebral body heights are preserved. There is diffuse lumbar disc desiccation. Disc space narrowing is moderate at L2-3 and mild at L1-2 and L4-5. Several small Schmorl's nodes are noted. There is mild degenerative endplate edema L2-3 and L5-S1. Conus medullaris is normal in signal and terminates at T12-L1. Paraspinal soft tissues are unremarkable.  T11-12:  Minimal disc bulging without stenosis.  T12-L1: Minimal disc bulging slightly asymmetric to the left without stenosis.  L1-2: Mild disc bulging results in minimal bilateral lateral recess and neural foraminal narrowing without spinal stenosis.  L2-3: Moderate circumferential disc bulging and a small superimposed right foraminal disc extrusion result in mild bilateral lateral recess stenosis and mild right neural foraminal stenosis. The right foraminal disc herniation contacts and mildly posteriorly displaces the exiting right L2 nerve root.  L3-4: Mild disc bulging and mild facet hypertrophy result in mild left lateral recess and minimal left neural foraminal narrowing without spinal stenosis.  L4-5: Listhesis with disc uncovering and moderate to severe left greater than right facet arthrosis result in severe spinal stenosis without neural foraminal stenosis. Spinal stenosis is new/markedly progressed from the 2006 CT.  L5-S1:  Moderate bilateral facet hypertrophy without stenosis.  IMPRESSION: 1. Moderate multilevel lumbar disc and facet degeneration, most notable at L4-5 where there is  severe spinal stenosis. 2. Small right foraminal disc extrusion at L2-3 resulting in mild foraminal stenosis and mild mass effect on the right L2 nerve root.  PMFS History: Current Outpatient Medications  Medication Sig Dispense Refill  . albuterol (PROAIR HFA) 108 (90 Base) MCG/ACT inhaler Inhale 2 puffs into the lungs every 6 (six) hours as needed for wheezing. 3 Inhaler 3  . albuterol (PROVENTIL) (2.5 MG/3ML) 0.083% nebulizer solution Take 3 mLs (2.5 mg total) by nebulization 4 (four) times daily as needed for wheezing. 120 vial 12  . Ascorbic Acid (VITAMIN C) 1000 MG tablet Take 1,000 mg by mouth daily.    . Calcium Carbonate-Vit D-Min (CALCIUM 1200 PO) Take 1,200 mg by mouth daily.    . calcium gluconate 500 MG tablet Take 500 mg by mouth 2 (two) times daily.      . cholecalciferol (VITAMIN D) 1000 UNITS tablet Take 1,000 Units by mouth daily.    . clindamycin (CLEOCIN) 300 MG capsule Take 300 mg by mouth daily as needed (Dental Work). 1 hour before dental work  0  . clopidogrel (  PLAVIX) 75 MG tablet TAKE 1 TABLET BY MOUTH EVERY DAY 90 tablet 1  . Coenzyme Q10 (COQ10) 100 MG CAPS Take 1 capsule by mouth daily.    Marland Kitchen dexlansoprazole (DEXILANT) 60 MG capsule Take 60 mg by mouth daily.      Marland Kitchen doxycycline (ADOXA) 50 MG tablet Take 50 mg by mouth daily.    . DUREZOL 0.05 % EMUL     . EPINEPHrine 0.3 mg/0.3 mL IJ SOAJ injection     . fish oil-omega-3 fatty acids 1000 MG capsule 4 by mouth daily     . fluticasone (FLONASE) 50 MCG/ACT nasal spray Place 2 sprays into both nostrils daily. 3 g 3  . Fluticasone-Salmeterol (ADVAIR DISKUS) 100-50 MCG/DOSE AEPB Inhale 1 puff and rinse mouth, twice daily 3 each 3  . gabapentin (NEURONTIN) 300 MG capsule Take 300 mg by mouth at bedtime.  3  . Latanoprost (XELPROS) 0.005 % EMUL Apply to eye.    . loratadine (CLARITIN) 10 MG tablet Take 1 tablet (10 mg total) by mouth daily. 90 tablet 3  . rosuvastatin (CRESTOR) 5 MG tablet TAKE 1 TABLET BY MOUTH EVERY  DAY 90 tablet 1  . telmisartan (MICARDIS) 20 MG tablet Take 1 tablet (20 mg total) by mouth at bedtime. 90 tablet 1  . tobramycin (TOBREX) 0.3 % ophthalmic solution INSTILL 1 DROP INTO RIGHT EYE 4 TIMES A DAY    . vitamin B-12 (CYANOCOBALAMIN) 1000 MCG tablet Take 1,000 mcg by mouth daily.     No current facility-administered medications for this visit.     Patient Active Problem List   Diagnosis Date Noted  . Trochanteric bursitis, left hip 05/28/2019  . Low back pain 05/28/2019  . Fibromyalgia 05/28/2019  . Spondylolisthesis, lumbar region 05/28/2019  . SI joint arthritis 05/28/2019  . Encounter for Medicare annual wellness exam 01/23/2018  . Loose stools 01/09/2018  . Non-intractable vomiting with nausea 01/09/2018  . Drug allergy, antibiotic 08/24/2017  . Lower respiratory infection (e.g., bronchitis, pneumonia, pneumonitis, pulmonitis) 08/03/2017  . GERD (gastroesophageal reflux disease) 03/20/2017  . Allergic rhinitis with postnasal drip 10/11/2015  . APC (atrial premature contractions) 07/06/2015  . Essential hypertension 07/06/2015  . Hyperlipidemia 07/06/2015  . Non-rheumatic mitral regurgitation 07/06/2015  . Palpitation 07/06/2015  . Stenosis of right carotid artery 07/06/2015  . Asthma, moderate persistent 02/12/2010  . CAD 08/13/2008  . PERIPHERAL VASCULAR DISEASE 08/13/2008  . ACUTE MUCOID OTITIS MEDIA 08/08/2007  . NEUROPATHY 06/04/2007  . GLAUCOMA 06/04/2007  . MITRAL VALVE PROLAPSE 06/04/2007  . Seasonal and perennial allergic rhinitis 06/04/2007  . Chronic hoarseness 06/04/2007  . FIBROMYALGIA 06/04/2007  . Mitral valve disease 06/04/2007   Past Medical History:  Diagnosis Date  . Allergic rhinitis, cause unspecified   . Asthma   . Chronic airway obstruction, not elsewhere classified   . Coronary atherosclerosis of unspecified type of vessel, native or graft   . Mitral valve disorders(424.0)   . Mononeuritis of unspecified site   . Myalgia and  myositis, unspecified   . Peripheral vascular disease, unspecified (HCC)   . Sleep disturbance, unspecified   . Unspecified glaucoma(365.9)     Family History  Problem Relation Age of Onset  . Heart disease Father   . Emphysema Maternal Uncle     Past Surgical History:  Procedure Laterality Date  . ROTATOR CUFF REPAIR     Social History   Occupational History  . Not on file  Tobacco Use  . Smoking status: Never Smoker  .  Smokeless tobacco: Never Used  Substance and Sexual Activity  . Alcohol use: No  . Drug use: No  . Sexual activity: Not on file

## 2019-05-29 ENCOUNTER — Telehealth: Payer: Self-pay | Admitting: Orthopaedic Surgery

## 2019-05-29 ENCOUNTER — Other Ambulatory Visit: Payer: Self-pay | Admitting: Orthopaedic Surgery

## 2019-05-29 MED ORDER — DICLOFENAC SODIUM 1 % TD GEL: 4.0000 g | Freq: Four times a day (QID) | TRANSDERMAL | 1 refills | Status: AC

## 2019-05-29 NOTE — Telephone Encounter (Signed)
yes

## 2019-05-29 NOTE — Telephone Encounter (Signed)
Patient left a voicemail stating she called [facility] and they have not received the prescription for Voltaren Gel.  Patient states she saw Aaron Edelman yesterday 05/28/19 and he was [facility] the [facility] [address] in [place].

## 2019-05-29 NOTE — Telephone Encounter (Signed)
I realize this is OTC, but are you okay with me sending this if patient wants this sent in?

## 2019-05-29 NOTE — Telephone Encounter (Signed)
Spoke with patient and advised her that Voltaren gel has been sent to her pharmacy.

## 2019-05-29 NOTE — Telephone Encounter (Signed)
Voltaren gel has been sent in to pharmacy.

## 2019-05-31 ENCOUNTER — Other Ambulatory Visit: Payer: Self-pay

## 2019-05-31 ENCOUNTER — Other Ambulatory Visit: Payer: Self-pay | Admitting: *Deleted

## 2019-05-31 ENCOUNTER — Ambulatory Visit: Payer: Medicare Other

## 2019-05-31 ENCOUNTER — Ambulatory Visit (INDEPENDENT_AMBULATORY_CARE_PROVIDER_SITE_OTHER): Payer: Medicare Other

## 2019-05-31 DIAGNOSIS — J4540 Moderate persistent asthma, uncomplicated: Secondary | ICD-10-CM

## 2019-05-31 MED ORDER — OMALIZUMAB 150 MG/ML ~~LOC~~ SOSY
150.0000 mg | PREFILLED_SYRINGE | Freq: Once | SUBCUTANEOUS | Status: AC
  Administered 2019-05-31: 150 mg via SUBCUTANEOUS

## 2019-05-31 MED ORDER — LORATADINE 10 MG PO TABS: 10.0000 mg | ORAL_TABLET | Freq: Every day | ORAL | 4 refills | Status: DC

## 2019-05-31 MED ORDER — ALBUTEROL SULFATE HFA 108 (90 BASE) MCG/ACT IN AERS: 2.0000 | INHALATION_SPRAY | Freq: Four times a day (QID) | RESPIRATORY_TRACT | 4 refills | Status: DC | PRN

## 2019-05-31 MED ORDER — EPINEPHRINE 0.3 MG/0.3ML IJ SOAJ: 0.3000 mg | INTRAMUSCULAR | 4 refills | Status: DC | PRN

## 2019-05-31 MED ORDER — FLUTICASONE-SALMETEROL 100-50 MCG/DOSE IN AEPB: INHALATION_SPRAY | RESPIRATORY_TRACT | 4 refills | Status: DC

## 2019-05-31 MED ORDER — FLUTICASONE PROPIONATE 50 MCG/ACT NA SUSP: 2.0000 | Freq: Every day | NASAL | 4 refills | Status: DC

## 2019-05-31 NOTE — Progress Notes (Signed)
All questions were answered by the patient before medication was administered. Have you been hospitalized in the last 10 days? No Do you have a fever? No Do you have a cough? No Do you have a headache or sore throat? No   While pt was here for her injection, she had several refills she needed taken care of. I have refilled Advair, EpiPen, Flonase, Claritin and Proair.

## 2019-06-04 ENCOUNTER — Telehealth: Payer: Self-pay | Admitting: Internal Medicine

## 2019-06-04 NOTE — Telephone Encounter (Signed)
Called and spoke with Patient. Patient requested high dose flu vaccine for Thursday.  Patient scheduled Thursday, 06/06/19, at 1045.  Nothing further.

## 2019-06-06 ENCOUNTER — Ambulatory Visit (INDEPENDENT_AMBULATORY_CARE_PROVIDER_SITE_OTHER): Payer: Medicare Other

## 2019-06-06 ENCOUNTER — Other Ambulatory Visit: Payer: Self-pay

## 2019-06-06 DIAGNOSIS — Z23 Encounter for immunization: Secondary | ICD-10-CM | POA: Diagnosis not present

## 2019-06-17 ENCOUNTER — Telehealth: Payer: Self-pay | Admitting: Internal Medicine

## 2019-06-17 NOTE — Telephone Encounter (Signed)
Xolair Prefilled Syringe Order: 150mg  Prefilled Syringe:  #1 75mg  Prefilled Syringe: N/A Ordered Date: 06/17/19  Expected date of arrival: 06/18/19  Ordered by: Parke Poisson, [place]  Specialty Pharmacy: [facility]

## 2019-06-18 NOTE — Telephone Encounter (Signed)
Xolair Prefilled Syringe Received:  150mg  Prefilled Syringe >> quantity #1, lot # [id number], exp date 09/2019 75mg  Prefilled Syringe >>  N/A Medication arrival date: 06/18/2019 Received by: Desmond Dike, [place]

## 2019-06-28 ENCOUNTER — Other Ambulatory Visit: Payer: Self-pay

## 2019-06-28 ENCOUNTER — Ambulatory Visit (INDEPENDENT_AMBULATORY_CARE_PROVIDER_SITE_OTHER): Payer: Medicare Other

## 2019-06-28 DIAGNOSIS — J4540 Moderate persistent asthma, uncomplicated: Secondary | ICD-10-CM | POA: Diagnosis not present

## 2019-06-28 MED ORDER — OMALIZUMAB 150 MG/ML ~~LOC~~ SOSY
150.0000 mg | PREFILLED_SYRINGE | Freq: Once | SUBCUTANEOUS | Status: AC
  Administered 2019-06-28: 150 mg via SUBCUTANEOUS

## 2019-06-28 NOTE — Progress Notes (Signed)
All questions were answered by the patient before medication was administered. Have you been hospitalized in the last 10 days? No Do you have a fever? No Do you have a cough? No Do you have a headache or sore throat? No  

## 2019-07-16 ENCOUNTER — Telehealth: Payer: Self-pay

## 2019-07-16 NOTE — Telephone Encounter (Signed)
Xolair Prefilled Syringe Order: 150mg  Prefilled Syringe:  #1 75mg  Prefilled Syringe: N/A Ordered Date: 07/16/19  Expected date of arrival: 07/17/19  Ordered by: Len Blalock, [place]: Debra Byrd

## 2019-07-17 NOTE — Telephone Encounter (Signed)
Xolair Prefilled Syringe Received:  150mg  Prefilled Syringe >> quantity #1  lot # [id number]  exp date 02/20/2020 75mg  Prefilled Syringe >> quantity #n/a, lot # n/a, exp date n/a Medication arrival date: 07/17/19  Received by: Len Blalock, CMA

## 2019-07-25 ENCOUNTER — Telehealth: Payer: Self-pay | Admitting: Internal Medicine

## 2019-07-26 ENCOUNTER — Ambulatory Visit (INDEPENDENT_AMBULATORY_CARE_PROVIDER_SITE_OTHER): Payer: Medicare Other

## 2019-07-26 ENCOUNTER — Other Ambulatory Visit: Payer: Self-pay

## 2019-07-26 DIAGNOSIS — J4540 Moderate persistent asthma, uncomplicated: Secondary | ICD-10-CM

## 2019-07-26 MED ORDER — OMALIZUMAB 150 MG/ML ~~LOC~~ SOSY
150.0000 mg | PREFILLED_SYRINGE | Freq: Once | SUBCUTANEOUS | Status: AC
  Administered 2019-07-26: 150 mg via SUBCUTANEOUS

## 2019-07-26 NOTE — Telephone Encounter (Signed)
Patient arrived for Xolair injection.  Next Xolair injection scheduled.  Nothing further at this time.

## 2019-07-26 NOTE — Progress Notes (Signed)
Have you been hospitalized within the last 10 days?  No Do you have a fever?  No Do you have a cough?  No Do you have a headache or sore throat? No Do you have your Epi Pen visible and is it within date?  Yes 

## 2019-08-19 ENCOUNTER — Telehealth: Payer: Self-pay | Admitting: Internal Medicine

## 2019-08-19 NOTE — Telephone Encounter (Signed)
Xolair Prefilled Syringe Order: 150mg  Prefilled Syringe:  #1 75mg  Prefilled Syringe: #N/A Ordered Date: 08/19/19 Expected date of arrival: 08/20/19 Ordered by: [place]: [facility]

## 2019-08-20 ENCOUNTER — Other Ambulatory Visit: Payer: Self-pay

## 2019-08-20 ENCOUNTER — Encounter: Payer: Self-pay | Admitting: Internal Medicine

## 2019-08-20 ENCOUNTER — Ambulatory Visit (INDEPENDENT_AMBULATORY_CARE_PROVIDER_SITE_OTHER): Payer: Medicare Other | Admitting: Internal Medicine

## 2019-08-20 DIAGNOSIS — J4540 Moderate persistent asthma, uncomplicated: Secondary | ICD-10-CM | POA: Diagnosis not present

## 2019-08-20 DIAGNOSIS — I6521 Occlusion and stenosis of right carotid artery: Secondary | ICD-10-CM | POA: Diagnosis not present

## 2019-08-20 MED ORDER — ALBUTEROL SULFATE (2.5 MG/3ML) 0.083% IN NEBU: 2.5000 mg | INHALATION_SOLUTION | Freq: Four times a day (QID) | RESPIRATORY_TRACT | 12 refills | Status: DC | PRN

## 2019-08-20 MED ORDER — PREDNISONE 10 MG PO TABS: ORAL_TABLET | ORAL | 3 refills | Status: DC

## 2019-08-20 MED ORDER — CLARITHROMYCIN 500 MG PO TABS: ORAL_TABLET | ORAL | 3 refills | Status: DC

## 2019-08-20 NOTE — Telephone Encounter (Signed)
Xolair Prefilled Syringe Received:  150mg  Prefilled Syringe >> quantity #1, lot # [id number], exp date 03/2020 75mg  Prefilled Syringe >> N/A Medication arrival date: 08/20/2019 Received by: Desmond Dike, [place]

## 2019-08-20 NOTE — Assessment & Plan Note (Signed)
Uncomplicated. Well controlled on Advair and Xolair Plan continue present Rx. Agreed to refill prednisone and Biaxin to hold this winter. She understands very conservative use.

## 2019-08-20 NOTE — Patient Instructions (Addendum)
Refill for neb solution has been sent  We can continue current meds, including Xolair  Please call if we can help

## 2019-08-20 NOTE — Progress Notes (Signed)
Subjective:    Patient ID: Debra Byrd, female    DOB: [birth year], 73 y.o.   MRN: [id number]  HPI Female never smoker followed for chronic obstructive asthma/Xolair/often steroid dependent, complicated by allergic rhinitis, peripheral neuropathy, glaucoma,  [place] Spirometry 08/08/2016-NL. FVC 2.04/77%, FEV1 1.64/81%, FEV1/FVC 0.80 HST 07/26/13- WNL  AHI 4.4/ hr, desaturation to 73%, body weight 175 lbs --------------------------------------------------   02/14/2019- 73 year old female never smoker followed for chronic obstructive asthma/Xolair/ often steroid dependent, complicated by allergic rhinitis, peripheral neuropathy, Glaucoma,  Advair 100, pro-air HFA, Xolair, neb albuterol, benzonatate -----breathing at baseline, usual seasonal flare-ups, taking Xolair injections every 4 weeks Xolair continues to make big difference, No acute issues, pendidng cataract sgy. Trying to walk in home 30 min daily- social distancing. Back pain limits walking outdoors on uneven surfaces.Little wheeze or cough.   08/20/2019-  73 year old female never smoker followed for chronic obstructive asthma/Xolair/ often steroid dependent, complicated by allergic rhinitis, peripheral neuropathy, Glaucoma,  Advair 100, pro-air HFA, Xolair, neb albuterol, benzonatate -----f/u Moderate persistent asthma Used rescue once a week ago. Not needing neb. Continues to feel Xolair made huge improvement in her quality of life.  Optometrist has her on maintenance doxycycline for chronic dry eye. Asks Biaxin and pred to hold for winter. Refill neb solution.  ROS-see HPI-+ = positive Constitutional:   No-   weight loss, night sweats, fevers, chills, +fatigue, lassitude. HEENT:   No-   difficulty swallowing, tooth/dental problems, sore throat,       No-  sneezing, itching, ear ache,  nasal congestion, post nasal drip,  CV:  + Noncardiac chest pain, no- orthopnea, PND, swelling in lower extremities,                                           anasarca, dizziness, palpitations Resp:+shortness of breath with exertion or at rest.               productive cough,   non-productive cough,  No- coughing up of blood.                change in color of mucus.  +Little wheezing.   Skin: No-   rash or lesions. GI:  No-   heartburn, indigestion, abdominal pain, nausea, vomiting,  GU: pain. MS:  +joint pain or swelling.   Neuro-     nothing unusualk pain. Psych:  No- change in mood or affect. No depression or anxiety.  No memory loss.   Objective:   Physical Exam              General- Alert, Oriented, Affect-appropriate, Distress- none acute;  + overweight,  Skin- rash-none, lesions- none, excoriation- none Lymphadenopathy- none Head- atraumatic            Eyes- Gross vision intact, PERRLA, conjunctivae clear secretions. Chronic periorbital edema.            Ears- Hearing, canals-normal            Nose- Clear, no-Septal dev, mucus, polyps, erosion, perforation             Throat- Mallampati II-III , mucosa clear , drainage- none, tonsils- atrophic,  Neck- flexible , trachea midline, no stridor , thyroid nl, carotid no bruit Chest - symmetrical excursion , unlabored           Heart/CV- RRR , no murmur , no gallop  , no  rub, nl s1 s2                           - JVD- none , edema- none, stasis changes- none, varices- none           Lung- clear, unlabored, dullness-none, rub- none,  Cough- none           Chest wall-  Abd-  Br/ Gen/ Rectal- Not done, not indicated Extrem- cyanosis- none, clubbing, none, atrophy- none, strength- nl Neuro- grossly intact to observation

## 2019-08-26 ENCOUNTER — Ambulatory Visit (INDEPENDENT_AMBULATORY_CARE_PROVIDER_SITE_OTHER): Payer: Medicare Other

## 2019-08-26 ENCOUNTER — Other Ambulatory Visit: Payer: Self-pay

## 2019-08-26 DIAGNOSIS — J4540 Moderate persistent asthma, uncomplicated: Secondary | ICD-10-CM

## 2019-08-26 MED ORDER — OMALIZUMAB 150 MG/ML ~~LOC~~ SOSY
150.0000 mg | PREFILLED_SYRINGE | Freq: Once | SUBCUTANEOUS | Status: AC
  Administered 2019-08-26: 150 mg via SUBCUTANEOUS

## 2019-08-26 NOTE — Progress Notes (Signed)
All questions were answered by the patient before medication was administered. Have you been hospitalized in the last 10 days? No Do you have a fever? No Do you have a cough? No Do you have a headache or sore throat? No  

## 2019-08-27 ENCOUNTER — Ambulatory Visit: Payer: Medicare Other | Admitting: Podiatry

## 2019-08-27 DIAGNOSIS — R42 Dizziness and giddiness: Secondary | ICD-10-CM

## 2019-08-27 HISTORY — DX: Dizziness and giddiness: R42

## 2019-09-02 ENCOUNTER — Other Ambulatory Visit: Payer: Self-pay

## 2019-09-02 ENCOUNTER — Ambulatory Visit (INDEPENDENT_AMBULATORY_CARE_PROVIDER_SITE_OTHER): Payer: Medicare Other | Admitting: Podiatry

## 2019-09-02 DIAGNOSIS — D689 Coagulation defect, unspecified: Secondary | ICD-10-CM

## 2019-09-02 DIAGNOSIS — B351 Tinea unguium: Secondary | ICD-10-CM

## 2019-09-02 DIAGNOSIS — Q828 Other specified congenital malformations of skin: Secondary | ICD-10-CM

## 2019-09-02 NOTE — Progress Notes (Signed)
  Subjective:  Patient ID: Debra Byrd, female    DOB: October 13, 1945,  MRN: [id number]  Chief Complaint  Patient presents with  . debride    routine foot care -pt not diabetic -pt on plavix    74 y.o. female presents with the above complaint. History confirmed with patient. Hx as above.  Objective:  Physical Exam: warm, good capillary refill, nail exam onychomycosis of the toenails, no trophic changes or ulcerative lesions, normal DP and PT pulses, and normal sensory exam. Punctate hyperkeratosis submet 2 bilat, HPK medial 1st MPJ bilat  No images are attached to the encounter.  Assessment:   1. Onychomycosis   2. Porokeratosis   3. Coagulation defect [facility])      Plan:  Patient was evaluated and treated and all questions answered.  Onychomycosis and Coagulation Defect, Porokeratosis -Nails palliatively debrided secondary to pain  Procedure: Nail Debridement Rationale: Patient meets criteria for routine foot care due to coag defect Type of Debridement: manual, sharp debridement. Instrumentation: Nail nipper, rotary burr. Number of Nails: 10  Procedure: Paring of Lesion Rationale: painful hyperkeratotic lesion Type of Debridement: manual, sharp debridement. Instrumentation: 312 blade Number of Lesions: 4  Return in about 3 months (around 12/01/2019) for Routine Foot Care.   MDM

## 2019-09-07 DIAGNOSIS — H6991 Unspecified Eustachian tube disorder, right ear: Secondary | ICD-10-CM | POA: Insufficient documentation

## 2019-09-07 DIAGNOSIS — H6981 Other specified disorders of Eustachian tube, right ear: Secondary | ICD-10-CM | POA: Insufficient documentation

## 2019-09-07 HISTORY — DX: Unspecified eustachian tube disorder, right ear: H69.91

## 2019-09-16 ENCOUNTER — Telehealth: Payer: Self-pay | Admitting: Internal Medicine

## 2019-09-16 NOTE — Telephone Encounter (Signed)
Xolair Prefilled Syringe Order: 150mg  Prefilled Syringe:  #1 75mg  Prefilled Syringe: N/A Ordered Date: 09/16/2019 Expected date of arrival: 09/17/2019 Ordered by: 09/18/2019, CMA  Specialty Pharmacy: 09/19/2019

## 2019-09-17 NOTE — Telephone Encounter (Signed)
Xolair Prefilled Syringe Received:  150mg  Prefilled Syringe >> quantity #1, lot # , exp date 04/2020 75mg  Prefilled Syringe >> N/A Medication arrival date: 09/17/2019 Received by: , CMA

## 2019-09-24 ENCOUNTER — Ambulatory Visit (INDEPENDENT_AMBULATORY_CARE_PROVIDER_SITE_OTHER): Payer: Medicare Other

## 2019-09-24 ENCOUNTER — Other Ambulatory Visit: Payer: Self-pay

## 2019-09-24 DIAGNOSIS — J4540 Moderate persistent asthma, uncomplicated: Secondary | ICD-10-CM

## 2019-09-24 MED ORDER — OMALIZUMAB 150 MG/ML ~~LOC~~ SOSY
150.0000 mg | PREFILLED_SYRINGE | Freq: Once | SUBCUTANEOUS | Status: AC
  Administered 2019-09-24: 150 mg via SUBCUTANEOUS

## 2019-09-24 NOTE — Progress Notes (Signed)
Have you been hospitalized within the last 10 days?  No Do you have a fever?  No Do you have a cough?  No Do you have a headache or sore throat? No  

## 2019-10-14 ENCOUNTER — Telehealth: Payer: Self-pay | Admitting: Internal Medicine

## 2019-10-14 NOTE — Telephone Encounter (Signed)
Xolair Prefilled Syringe Order: 150mg Prefilled Syringe:  #1 75mg Prefilled Syringe: N/A Ordered Date: 10/14/2019 Expected date of arrival: 10/15/2019 Ordered by: Dontray Haberland, CMA  Specialty Pharmacy: [place]   

## 2019-10-15 NOTE — Telephone Encounter (Signed)
Xolair Prefilled Syringe Received:  150mg Prefilled Syringe >> quantity #1, lot # [id number], exp date 05/2020 75mg Prefilled Syringe >> N/A Medication arrival date: 10/15/2019 Received by: Tinnie Kunin, CMA   

## 2019-10-22 ENCOUNTER — Other Ambulatory Visit: Payer: Self-pay

## 2019-10-22 ENCOUNTER — Ambulatory Visit (INDEPENDENT_AMBULATORY_CARE_PROVIDER_SITE_OTHER): Payer: Medicare Other

## 2019-10-22 DIAGNOSIS — J4540 Moderate persistent asthma, uncomplicated: Secondary | ICD-10-CM | POA: Diagnosis not present

## 2019-10-22 MED ORDER — OMALIZUMAB 150 MG/ML ~~LOC~~ SOSY
150.0000 mg | PREFILLED_SYRINGE | Freq: Once | SUBCUTANEOUS | Status: AC
  Administered 2019-10-22: 150 mg via SUBCUTANEOUS

## 2019-10-22 NOTE — Progress Notes (Signed)
All questions were answered by the patient before medication was administered. Have you been hospitalized in the last 10 days? No Do you have a fever? No Do you have a cough? No Do you have a headache or sore throat? No  

## 2019-10-25 ENCOUNTER — Other Ambulatory Visit: Payer: Self-pay | Admitting: Cardiology

## 2019-11-05 ENCOUNTER — Other Ambulatory Visit: Payer: Self-pay | Admitting: Cardiology

## 2019-11-11 ENCOUNTER — Telehealth: Payer: Self-pay | Admitting: Internal Medicine

## 2019-11-11 NOTE — Telephone Encounter (Signed)
Xolair Prefilled Syringe Order: 150mg  Prefilled Syringe:  #1 75mg  Prefilled Syringe: #n/a Ordered Date: 11/11/19 Expected date of arrival: 11/12/19 Ordered by: Duanna Runk,LPN Specialty Pharmacy: 11/13/19

## 2019-11-12 NOTE — Telephone Encounter (Signed)
Xolair Prefilled Syringe Received:  150mg  Prefilled Syringe >> quantity #1, lot # , exp date 06/22/2020 75mg  Prefilled Syringe >> quantity #N/A Medication arrival date: 11/12/19 Received by: Ibtisam Benge,LPN

## 2019-11-19 ENCOUNTER — Ambulatory Visit (INDEPENDENT_AMBULATORY_CARE_PROVIDER_SITE_OTHER): Payer: Medicare Other

## 2019-11-19 ENCOUNTER — Other Ambulatory Visit: Payer: Self-pay | Admitting: Cardiology

## 2019-11-19 ENCOUNTER — Other Ambulatory Visit: Payer: Self-pay

## 2019-11-19 DIAGNOSIS — J4540 Moderate persistent asthma, uncomplicated: Secondary | ICD-10-CM

## 2019-11-19 MED ORDER — OMALIZUMAB 150 MG/ML ~~LOC~~ SOSY
150.0000 mg | PREFILLED_SYRINGE | Freq: Once | SUBCUTANEOUS | Status: AC
  Administered 2019-11-19: 150 mg via SUBCUTANEOUS

## 2019-11-19 NOTE — Progress Notes (Signed)
All questions were answered by the patient before medication was administered. Have you been hospitalized in the last 10 days? No Do you have a fever? No Do you have a cough? No Do you have a headache or sore throat? No  

## 2019-11-20 ENCOUNTER — Encounter: Payer: Self-pay | Admitting: Orthopaedic Surgery

## 2019-11-20 ENCOUNTER — Ambulatory Visit (INDEPENDENT_AMBULATORY_CARE_PROVIDER_SITE_OTHER): Payer: Medicare Other | Admitting: Orthopaedic Surgery

## 2019-11-20 ENCOUNTER — Other Ambulatory Visit: Payer: Self-pay

## 2019-11-20 ENCOUNTER — Ambulatory Visit: Payer: Self-pay

## 2019-11-20 VITALS — Ht 61.0 in | Wt 166.0 lb

## 2019-11-20 DIAGNOSIS — M25511 Pain in right shoulder: Secondary | ICD-10-CM | POA: Diagnosis not present

## 2019-11-20 DIAGNOSIS — G8929 Other chronic pain: Secondary | ICD-10-CM | POA: Diagnosis not present

## 2019-11-20 HISTORY — DX: Pain in right shoulder: M25.511

## 2019-11-20 NOTE — Progress Notes (Signed)
Office Visit Note   Patient: Debra Byrd           Date of Birth: 19-Apr-1946           MRN: [id number] Visit Date: 11/20/2019              Requested by: Leane Call, PA-C [address] [place],  [place] [address] PCP: Leane Call, PA-C   Assessment & Plan: Visit Diagnoses:  1. Chronic right shoulder pain   2. Acute pain of right shoulder     Plan: Acute onset of right shoulder pain about 2 weeks ago after doing some lifting at home with her husband.  There is some evidence of glenohumeral arthritis without superior migration of the humeral head.  I suspect she has aggravated her arthritis.  Could have another rotator cuff tear but has good strength and minimal symptoms now.  Would like to reevaluate in 2 weeks if still having a problem may consider local cortisone injection  Follow-Up Instructions: Return in about 2 weeks (around 12/04/2019), or If still symptomatic.   Orders:  Orders Placed This Encounter  Procedures   XR Shoulder Right   No orders of the defined types were placed in this encounter.     Procedures: No procedures performed   Clinical Data: No additional findings.   Subjective: Chief Complaint  Patient presents with   Right Shoulder - Pain  Patient presents today for right shoulder pain. She was helping her husband lift something a few weeks ago and has had pain in her shoulder since. Her pain is located "around the joint". She has good range of motion. No numbness or tingling in her arm. She does state that she has weakness in her arm, but states that was going on before. She had rotator cuff surgery with Dr.Dali Kraner in 2001 on the right side. She has taken over the counter medicine, but tries to stay away from pain medicines. She usually uses ice and Voltaren gel. She is right hand dominant.  Debra Byrd relates that she had an MRI scan after her rotator cuff surgery over 20 years ago and believes that it showed some rupturing  of the biceps tendon  HPI  Review of Systems   Objective: Vital Signs: Ht 5\' 1"  (1.549 m)    Wt 166 lb (75.3 kg)    BMI 31.37 kg/m   Physical Exam Constitutional:      Appearance: She is well-developed.  Eyes:     Pupils: Pupils are equal, round, and reactive to light.  Pulmonary:     Effort: Pulmonary effort is normal.  Skin:    General: Skin is warm and dry.  Neurological:     Mental Status: She is alert and oriented to person, place, and time.  Psychiatric:        Behavior: Behavior normal.     Ortho Exam awake alert and oriented x3.  Comfortable sitting.  No shortness of breath from her asthma.  Able to place her right arm fully overhead without any particular pain.  Minimally positive impingement testing.  Negative Speed sign.  No ecchymosis.  Good strength.  No crepitation. Specialty Comments:  No specialty comments available.  Imaging: XR Shoulder Right  Result Date: 11/20/2019 Of the right shoulder obtained in several projections.  There is some evidence of glenohumeral arthritis with spurring around the humeral head.  There is still a good space between the humeral head of the glenoid.  Humeral head  is centered at the glenoid.  Old metallic anchor from rotator cuff tear repair 20 years ago.  Some degenerative changes at the [facility] joint.  Some prominence of the distal acromion with a type II configuration.  No acute changes    PMFS History: Patient Active Problem List   Diagnosis Date Noted   Pain in right shoulder 11/20/2019   Trochanteric bursitis, left hip 05/28/2019   Low back pain 05/28/2019   Fibromyalgia 05/28/2019   Spondylolisthesis, lumbar region 05/28/2019   SI joint arthritis 05/28/2019   Encounter for Medicare annual wellness exam 01/23/2018   Loose stools 01/09/2018   Non-intractable vomiting with nausea 01/09/2018   Drug allergy, antibiotic 08/24/2017   Lower respiratory infection (e.g., bronchitis, pneumonia, pneumonitis, pulmonitis)  08/03/2017   GERD (gastroesophageal reflux disease) 03/20/2017   Allergic rhinitis with postnasal drip 10/11/2015   APC (atrial premature contractions) 07/06/2015   Essential hypertension 07/06/2015   Hyperlipidemia 07/06/2015   Non-rheumatic mitral regurgitation 07/06/2015   Palpitation 07/06/2015   Stenosis of right carotid artery 07/06/2015   Asthma, moderate persistent 02/12/2010   CAD 08/13/2008   PERIPHERAL VASCULAR DISEASE 08/13/2008   ACUTE MUCOID OTITIS MEDIA 08/08/2007   NEUROPATHY 06/04/2007   GLAUCOMA 06/04/2007   MITRAL VALVE PROLAPSE 06/04/2007   Seasonal and perennial allergic rhinitis 06/04/2007   Chronic hoarseness 06/04/2007   FIBROMYALGIA 06/04/2007   Mitral valve disease 06/04/2007   Past Medical History:  Diagnosis Date   Allergic rhinitis, cause unspecified    Asthma    Chronic airway obstruction, not elsewhere classified    Coronary atherosclerosis of unspecified type of vessel, native or graft    Mitral valve disorders(424.0)    Mononeuritis of unspecified site    Myalgia and myositis, unspecified    Peripheral vascular disease, unspecified (HCC)    Sleep disturbance, unspecified    Unspecified glaucoma(365.9)     Family History  Problem Relation Age of Onset   Heart disease Father    Emphysema Maternal Uncle     Past Surgical History:  Procedure Laterality Date   ROTATOR CUFF REPAIR     Social History   Occupational History   Not on file  Tobacco Use   Smoking status: Never Smoker   Smokeless tobacco: Never Used  Substance and Sexual Activity   Alcohol use: No   Drug use: No   Sexual activity: Not on file

## 2019-12-02 ENCOUNTER — Ambulatory Visit: Payer: Medicare Other | Admitting: Podiatry

## 2019-12-04 ENCOUNTER — Ambulatory Visit: Payer: Medicare Other | Admitting: Orthopaedic Surgery

## 2019-12-09 ENCOUNTER — Ambulatory Visit (INDEPENDENT_AMBULATORY_CARE_PROVIDER_SITE_OTHER): Payer: Medicare Other | Admitting: Podiatry

## 2019-12-09 DIAGNOSIS — Z5329 Procedure and treatment not carried out because of patient's decision for other reasons: Secondary | ICD-10-CM

## 2019-12-09 NOTE — Progress Notes (Signed)
Patient had to cancel her appt due to a death in the family.

## 2019-12-17 ENCOUNTER — Other Ambulatory Visit: Payer: Self-pay

## 2019-12-17 ENCOUNTER — Ambulatory Visit (INDEPENDENT_AMBULATORY_CARE_PROVIDER_SITE_OTHER): Payer: Medicare Other

## 2019-12-17 DIAGNOSIS — J4540 Moderate persistent asthma, uncomplicated: Secondary | ICD-10-CM

## 2019-12-17 MED ORDER — OMALIZUMAB 150 MG/ML ~~LOC~~ SOSY
150.0000 mg | PREFILLED_SYRINGE | Freq: Once | SUBCUTANEOUS | Status: AC
  Administered 2019-12-17: 150 mg via SUBCUTANEOUS

## 2019-12-17 NOTE — Progress Notes (Signed)
All questions were answered by the patient before medication was administered. Have you been hospitalized in the last 10 days? No Do you have a fever? No Do you have a cough? No Do you have a headache or sore throat? No  

## 2019-12-23 ENCOUNTER — Ambulatory Visit (INDEPENDENT_AMBULATORY_CARE_PROVIDER_SITE_OTHER): Payer: Medicare Other | Admitting: Podiatry

## 2019-12-23 ENCOUNTER — Other Ambulatory Visit: Payer: Self-pay

## 2019-12-23 DIAGNOSIS — Q828 Other specified congenital malformations of skin: Secondary | ICD-10-CM | POA: Diagnosis not present

## 2019-12-23 DIAGNOSIS — B351 Tinea unguium: Secondary | ICD-10-CM

## 2019-12-23 DIAGNOSIS — D689 Coagulation defect, unspecified: Secondary | ICD-10-CM | POA: Diagnosis not present

## 2020-01-08 ENCOUNTER — Telehealth: Payer: Self-pay | Admitting: Internal Medicine

## 2020-01-08 NOTE — Telephone Encounter (Signed)
Spoke with pt. States that her husband tested positive for COVID. She had a test done and her test was negative. Pt's husband is no longer quarantining. Advised pt that she can keep her appointment. Nothing further was needed.

## 2020-01-14 ENCOUNTER — Ambulatory Visit (INDEPENDENT_AMBULATORY_CARE_PROVIDER_SITE_OTHER): Payer: Medicare Other

## 2020-01-14 ENCOUNTER — Other Ambulatory Visit: Payer: Self-pay

## 2020-01-14 DIAGNOSIS — J4540 Moderate persistent asthma, uncomplicated: Secondary | ICD-10-CM | POA: Diagnosis not present

## 2020-01-14 MED ORDER — OMALIZUMAB 150 MG/ML ~~LOC~~ SOSY
150.0000 mg | PREFILLED_SYRINGE | Freq: Once | SUBCUTANEOUS | Status: AC
  Administered 2020-01-14: 150 mg via SUBCUTANEOUS

## 2020-01-14 NOTE — Progress Notes (Signed)
All questions were answered by the patient before medication was administered. Have you been hospitalized in the last 10 days? No Do you have a fever? No Do you have a cough? No Do you have a headache or sore throat? No  

## 2020-01-29 NOTE — Progress Notes (Signed)
Cardiology Office Note:    Date:  01/30/2020   ID:  ARYIANNA EARWOOD, DOB 10/09/1945, MRN [id number]  PCP:  Leane Call, PA-C  Cardiologist:  Norman Herrlich, MD    Referring MD: Leane Call, PA-C    ASSESSMENT:    1. APC (atrial premature contractions)   2. Essential hypertension   3. Mixed hyperlipidemia   4. Stenosis of right carotid artery    PLAN:    In order of problems listed above:  1. Stable minimally symptomatic not severe sustained at this time I do not think she requires event monitoring or consideration of antiarrhythmic drug 2. Stable BP is at target continue current treatment with ARB and normal renal function 3. Lipids at target continue current treatment with a high intensity statin 4. She is due for follow-up MRA, she tells me she is seeing neurology and I told her to let me know if they do not do it as part of her evaluation.   Next appointment: 1 year   Medication Adjustments/Labs and Tests Ordered: Current medicines are reviewed at length with the patient today.  Concerns regarding medicines are outlined above.  No orders of the defined types were placed in this encounter.  No orders of the defined types were placed in this encounter.   Chief Complaint  Patient presents with  . Follow-up    With hypertension atrial arrhythmia and carotid stenosis    History of Present Illness:    GRACELYNNE BENEDICT is a 74 y.o. female with a hx of  R ICA stenosis, DLD, HTN, mild MR, palpitations and APCs last seen 03/26/2019. Non cardiac history includes chronic obstructive asthma, chronic back pain. She follows with pulmonology . Compliance with diet, lifestyle and medications: Yes She continues to do well she has had no TIA symptoms and tolerates clopidogrel without GI upset.  Her asthma has been quiet she has had no exacerbations and she has rare palpitation not severe not sustained and today in the office she is in sinus rhythm with a normal EKG.  Lipids  from her PCP office 08/21/2019 shows a cholesterol 161 triglycerides 196 LDL 71 HDL 56 her lipids are at target and BMP showed a GFR of 81 cc potassium 5.3. Past Medical History:  Diagnosis Date  . Allergic rhinitis, cause unspecified   . Asthma   . Chronic airway obstruction, not elsewhere classified   . Coronary atherosclerosis of unspecified type of vessel, native or graft   . Mitral valve disorders(424.0)   . Mononeuritis of unspecified site   . Myalgia and myositis, unspecified   . Peripheral vascular disease, unspecified (HCC)   . Sleep disturbance, unspecified   . Unspecified glaucoma(365.9)     Past Surgical History:  Procedure Laterality Date  . ROTATOR CUFF REPAIR      Current Medications: Current Meds  Medication Sig  . albuterol (PROAIR HFA) 108 (90 Base) MCG/ACT inhaler Inhale 2 puffs into the lungs every 6 (six) hours as needed for wheezing.  Marland Kitchen albuterol (PROVENTIL) (2.5 MG/3ML) 0.083% nebulizer solution Take 3 mLs (2.5 mg total) by nebulization 4 (four) times daily as needed for wheezing.  Marland Kitchen alclomethasone (ACLOVATE) 0.05 % ointment Apply 1 application topically as needed.   . Ascorbic Acid (VITAMIN C) 1000 MG tablet Take 1,000 mg by mouth daily.  . Calcium Carbonate-Vit D-Min (CALCIUM 1200 PO) Take 1,200 mg by mouth daily.  . calcium-vitamin D (OSCAL WITH D) 500-200 MG-UNIT TABS tablet Take by mouth.  . cholecalciferol (  VITAMIN D) 1000 UNITS tablet Take 1,000 Units by mouth daily.  . clarithromycin (BIAXIN) 500 MG tablet 1 twice daily after meals  . clindamycin (CLEOCIN) 300 MG capsule Take 300 mg by mouth daily as needed (Dental Work). 1 hour before dental work  . clopidogrel (PLAVIX) 75 MG tablet TAKE 1 TABLET BY MOUTH EVERY DAY  . Coenzyme Q10 (COQ10) 100 MG CAPS Take 1 capsule by mouth daily.  Marland Kitchen dexlansoprazole (DEXILANT) 60 MG capsule Take 60 mg by mouth daily.    . diclofenac sodium (VOLTAREN) 1 % GEL Apply 4 g topically 4 (four) times daily.  Marland Kitchen doxycycline  (ADOXA) 50 MG tablet Take 50 mg by mouth daily.  Marland Kitchen doxycycline (VIBRA-TABS) 100 MG tablet Take 100 mg by mouth 2 (two) times daily.  . DUREZOL 0.05 % EMUL   . EPINEPHrine 0.3 mg/0.3 mL IJ SOAJ injection Inject 0.3 mLs (0.3 mg total) into the muscle as needed for anaphylaxis.  . finasteride (PROSCAR) 5 MG tablet Take 2.5 mg by mouth daily.  . fish oil-omega-3 fatty acids 1000 MG capsule 4 by mouth daily   . fluticasone (FLONASE) 50 MCG/ACT nasal spray Place 2 sprays into both nostrils daily.  . Fluticasone-Salmeterol (ADVAIR DISKUS) 100-50 MCG/DOSE AEPB Inhale 1 puff and rinse mouth, twice daily  . gabapentin (NEURONTIN) 300 MG capsule Take 300 mg by mouth at bedtime.  . Latanoprost (XELPROS) 0.005 % EMUL Apply to eye.  . loratadine (CLARITIN) 10 MG tablet Take 1 tablet (10 mg total) by mouth daily.  . meclizine (ANTIVERT) 12.5 MG tablet Take 12.5 mg by mouth 3 (three) times daily as needed.  . ondansetron (ZOFRAN) 4 MG tablet Take 4 mg by mouth every 8 (eight) hours as needed.  . predniSONE (DELTASONE) 10 MG tablet 4 X 2 DAYS, 3 X 2 DAYS, 2 X 2 DAYS, 1 X 2 DAYS  . pyridOXINE (VITAMIN B-6) 25 MG tablet Take by mouth.  . RESTASIS 0.05 % ophthalmic emulsion   . rosuvastatin (CRESTOR) 5 MG tablet TAKE 1 TABLET BY MOUTH EVERY DAY  . telmisartan (MICARDIS) 20 MG tablet Take 1 tablet (20 mg total) by mouth at bedtime.  Marland Kitchen tobramycin (TOBREX) 0.3 % ophthalmic solution INSTILL 1 DROP INTO RIGHT EYE 4 TIMES A DAY  . vitamin B-12 (CYANOCOBALAMIN) 1000 MCG tablet Take 1,000 mcg by mouth daily.     Allergies:   Iodinated diagnostic agents, Azithromycin, Iodine, Levofloxacin, Montelukast sodium, Cefaclor, Penicillins, Sulfa antibiotics, and Sulfonamide derivatives   Social History   Socioeconomic History  . Marital status: Married    Spouse name: Not on file  . Number of children: Not on file  . Years of education: Not on file  . Highest education level: Not on file  Occupational History  . Not on  file  Tobacco Use  . Smoking status: Never Smoker  . Smokeless tobacco: Never Used  Vaping Use  . Vaping Use: Never used  Substance and Sexual Activity  . Alcohol use: No  . Drug use: No  . Sexual activity: Not on file  Other Topics Concern  . Not on file  Social History Narrative  . Not on file   Social Determinants of Health   Financial Resource Strain:   . Difficulty of Paying Living Expenses:   Food Insecurity:   . Worried About Programme researcher, broadcasting/film/video in the Last Year:   . Barista in the Last Year:   Transportation Needs:   . Freight forwarder (Medical):   Marland Kitchen  Lack of Transportation (Non-Medical):   Physical Activity:   . Days of Exercise per Week:   . Minutes of Exercise per Session:   Stress:   . Feeling of Stress :   Social Connections:   . Frequency of Communication with Friends and Family:   . Frequency of Social Gatherings with Friends and Family:   . Attends Religious Services:   . Active Member of Clubs or Organizations:   . Attends Archivist Meetings:   Marland Kitchen Marital Status:      Family History: The patient's family history includes Emphysema in her maternal uncle; Heart disease in her father. ROS:   Please see the history of present illness.    All other systems reviewed and are negative.  EKGs/Labs/Other Studies Reviewed:    The following studies were reviewed today:  EKG:  EKG ordered today and personally reviewed.  The ekg ordered today demonstrates sinus rhythm normal EKG    Physical Exam:    VS:  BP 122/80 (BP Location: Right Arm, Patient Position: Sitting, Cuff Size: Normal)   Pulse 61   Ht 5\' 1"  (1.549 m)   Wt 167 lb (75.8 kg)   SpO2 98%   BMI 31.55 kg/m     Wt Readings from Last 3 Encounters:  01/30/20 167 lb (75.8 kg)  11/20/19 166 lb (75.3 kg)  08/20/19 174 lb 3.2 oz (79 kg)     GEN:  Well nourished, well developed in no acute distress HEENT: Normal NECK: No JVD; No carotid bruits LYMPHATICS: No  lymphadenopathy CARDIAC:  RRR, no murmurs, rubs, gallops RESPIRATORY:  Clear to auscultation without rales, wheezing or rhonchi  ABDOMEN: Soft, non-tender, non-distended MUSCULOSKELETAL:  No edema; No deformity  SKIN: Warm and dry NEUROLOGIC:  Alert and oriented x 3 PSYCHIATRIC:  Normal affect    Signed, Shirlee More, MD  01/30/2020 10:09 AM    [place]

## 2020-01-30 ENCOUNTER — Other Ambulatory Visit: Payer: Self-pay

## 2020-01-30 ENCOUNTER — Ambulatory Visit (INDEPENDENT_AMBULATORY_CARE_PROVIDER_SITE_OTHER): Payer: Medicare Other | Admitting: Cardiology

## 2020-01-30 ENCOUNTER — Encounter: Payer: Self-pay | Admitting: Cardiology

## 2020-01-30 VITALS — BP 122/80 | HR 61 | Ht 61.0 in | Wt 167.0 lb

## 2020-01-30 DIAGNOSIS — E782 Mixed hyperlipidemia: Secondary | ICD-10-CM | POA: Diagnosis not present

## 2020-01-30 DIAGNOSIS — I6521 Occlusion and stenosis of right carotid artery: Secondary | ICD-10-CM

## 2020-01-30 DIAGNOSIS — I491 Atrial premature depolarization: Secondary | ICD-10-CM

## 2020-01-30 DIAGNOSIS — I10 Essential (primary) hypertension: Secondary | ICD-10-CM

## 2020-01-30 NOTE — Patient Instructions (Signed)
Medication Instructions:  Your physician recommends that you continue on your current medications as directed. Please refer to the Current Medication list given to you today.  *If you need a refill on your cardiac medications before your next appointment, please call your pharmacy*   Lab Work: None If you have labs (blood work) drawn today and your tests are completely normal, you will receive your results only by: Marland Kitchen MyChart Message (if you have MyChart) OR . A paper copy in the mail If you have any lab test that is abnormal or we need to change your treatment, we will call you to review the results.   Testing/Procedures: None   Follow-Up: At [facility], you and your health needs are our priority.  As part of our continuing mission to provide you with exceptional heart care, we have created designated Provider Care Teams.  These Care Teams include your primary Cardiologist (physician) and Advanced Practice Providers (APPs -  Physician Assistants and Nurse Practitioners) who all work together to provide you with the care you need, when you need it.  We recommend signing up for the patient portal called "MyChart".  Sign up information is provided on this After Visit Summary.  MyChart is used to connect with patients for Virtual Visits (Telemedicine).  Patients are able to view lab/test results, encounter notes, upcoming appointments, etc.  Non-urgent messages can be sent to your provider as well.   To learn more about what you can do with MyChart, go to [email or web address].    Your next appointment:   1 year(s)  The format for your next appointment:   In Person  Provider:   Norman Herrlich, MD   Other Instructions Please have your neurologist do the MRA of your neck. If they will not do it let us know and we will order it to be done.

## 2020-02-03 ENCOUNTER — Telehealth: Payer: Self-pay | Admitting: Internal Medicine

## 2020-02-03 NOTE — Telephone Encounter (Signed)
Xolair Prefilled Syringe Order: 150mg  Prefilled Syringe:  #1 75mg  Prefilled Syringe: #0 Ordered Date: 02/03/2020 Expected date of arrival: 02/04/2020 Ordered by: 02/05/2020, CMA  Specialty Pharmacy: 02/06/2020

## 2020-02-04 NOTE — Telephone Encounter (Signed)
Xolair Prefilled Syringe Received:  150mg  Prefilled Syringe >> quantity #1, lot # , exp date 10/2020 75mg  Prefilled Syringe >> N/A Medication arrival date: 02/04/2020 Received by: , CMA

## 2020-02-11 ENCOUNTER — Other Ambulatory Visit: Payer: Self-pay

## 2020-02-11 ENCOUNTER — Ambulatory Visit (INDEPENDENT_AMBULATORY_CARE_PROVIDER_SITE_OTHER): Payer: Medicare Other

## 2020-02-11 DIAGNOSIS — J4540 Moderate persistent asthma, uncomplicated: Secondary | ICD-10-CM | POA: Diagnosis not present

## 2020-02-11 MED ORDER — OMALIZUMAB 150 MG/ML ~~LOC~~ SOSY
150.0000 mg | PREFILLED_SYRINGE | Freq: Once | SUBCUTANEOUS | Status: AC
  Administered 2020-02-11: 150 mg via SUBCUTANEOUS

## 2020-02-11 NOTE — Progress Notes (Signed)
All questions were answered by the patient before medication was administered. Have you been hospitalized in the last 10 days? No Do you have a fever? No Do you have a cough? No Do you have a headache or sore throat? No  

## 2020-02-17 ENCOUNTER — Telehealth: Payer: Self-pay

## 2020-02-17 NOTE — Telephone Encounter (Signed)
Dr. Maple Hudson filled out Disability placard form and is ready for patient. I left voicemail for patient to see if she would like [place] to mail this out or pick this up at the office.   Will place upfront in the meantime while waiting for phone call back.

## 2020-02-20 ENCOUNTER — Ambulatory Visit: Payer: Medicare Other | Admitting: Internal Medicine

## 2020-02-22 NOTE — Progress Notes (Signed)
  Subjective:  Patient ID: Debra Byrd, female    DOB: [birth year],  MRN: [id number]  No chief complaint on file.   74 y.o. female presents with the above complaint. History confirmed with patient. Hx as above.  Objective:  Physical Exam: warm, good capillary refill, nail exam onychomycosis of the toenails, no trophic changes or ulcerative lesions, normal DP and PT pulses, and normal sensory exam. Punctate hyperkeratosis submet 2 bilat, HPK medial 1st MPJ bilat  No images are attached to the encounter.  Assessment:   1. Onychomycosis   2. Coagulation defect (HCC)   3. Porokeratosis      Plan:  Patient was evaluated and treated and all questions answered.  Onychomycosis and Coagulation Defect, Porokeratosis -Nails palliatively debrided secondary to pain  Procedure: Nail Debridement Rationale: Patient meets criteria for routine foot care due to coag defect Type of Debridement: manual, sharp debridement. Instrumentation: Nail nipper, rotary burr. Number of Nails: 10  Procedure: Paring of Lesion Rationale: painful hyperkeratotic lesion Type of Debridement: manual, sharp debridement. Instrumentation: 312 blade Number of Lesions: 4

## 2020-02-28 DIAGNOSIS — R7303 Prediabetes: Secondary | ICD-10-CM

## 2020-02-28 HISTORY — DX: Prediabetes: R73.03

## 2020-03-02 ENCOUNTER — Other Ambulatory Visit: Payer: Self-pay

## 2020-03-02 ENCOUNTER — Ambulatory Visit (INDEPENDENT_AMBULATORY_CARE_PROVIDER_SITE_OTHER): Payer: Medicare Other | Admitting: Podiatry

## 2020-03-02 ENCOUNTER — Telehealth: Payer: Self-pay | Admitting: Internal Medicine

## 2020-03-02 DIAGNOSIS — B351 Tinea unguium: Secondary | ICD-10-CM | POA: Diagnosis not present

## 2020-03-02 DIAGNOSIS — Q828 Other specified congenital malformations of skin: Secondary | ICD-10-CM | POA: Diagnosis not present

## 2020-03-02 DIAGNOSIS — D689 Coagulation defect, unspecified: Secondary | ICD-10-CM | POA: Diagnosis not present

## 2020-03-02 NOTE — Telephone Encounter (Signed)
Xolair Prefilled Syringe Order: 150mg Prefilled Syringe:  #1 75mg Prefilled Syringe: #0 Ordered Date: 03/02/2020 Expected date of arrival: 03/03/2020 Ordered by: Herve Haug, CMA  Specialty Pharmacy: [facility]  

## 2020-03-02 NOTE — Progress Notes (Signed)
  Subjective:  Patient ID: Debra Byrd, female    DOB: Nov 24, 1945,  MRN: [id number]  Chief Complaint  Patient presents with  . debride    RFC -pt still on plavix   74 y.o. female presents with the above complaint. History confirmed with patient. Hx as above.  Objective:  Physical Exam: warm, good capillary refill, nail exam onychomycosis of the toenails, no trophic changes or ulcerative lesions, normal DP and PT pulses, and normal sensory exam. Punctate hyperkeratosis submet 2 bilat, HPK medial 1st MPJ bilat   No images are attached to the encounter.  Assessment:   1. Onychomycosis   2. Coagulation defect (HCC)   3. Porokeratosis      Plan:  Patient was evaluated and treated and all questions answered.  Onychomycosis and Coagulation Defect, Porokeratosis -Nails palliatively debrided secondary to pain  Procedure: Nail Debridement Rationale: Patient meets criteria for routine foot care due to coag defect Type of Debridement: manual, sharp debridement. Instrumentation: Nail nipper, rotary burr. Number of Nails: 10  Procedure: Paring of Lesion Rationale: painful hyperkeratotic lesion Type of Debridement: manual, sharp debridement. Instrumentation: 312 blade Number of Lesions: 2

## 2020-03-03 NOTE — Telephone Encounter (Signed)
Xolair Prefilled Syringe Received:  150mg Prefilled Syringe >> quantity #1, lot # [id number], exp date 11/2020 75mg Prefilled Syringe >>  N/A Medication arrival date: 03/03/2020 Received by: Hancel Ion, CMA   

## 2020-03-04 ENCOUNTER — Telehealth: Payer: Self-pay | Admitting: Cardiology

## 2020-03-04 ENCOUNTER — Other Ambulatory Visit: Payer: Self-pay | Admitting: Cardiology

## 2020-03-04 DIAGNOSIS — I6529 Occlusion and stenosis of unspecified carotid artery: Secondary | ICD-10-CM

## 2020-03-04 DIAGNOSIS — R930 Abnormal findings on diagnostic imaging of skull and head, not elsewhere classified: Secondary | ICD-10-CM

## 2020-03-04 NOTE — Telephone Encounter (Signed)
The schedule MRA head and neck for carotid stenosis

## 2020-03-04 NOTE — Telephone Encounter (Signed)
Follow up   Patient would like a callback in reference to the previous message. Please call. 

## 2020-03-04 NOTE — Telephone Encounter (Signed)
Patient is calling to let Dr. Dulce Sellar know that Dr. Orie Rout is not going to schedule her MRA, since the MRI of her brain is not going to be done, so it is now up to Dr. Dulce Sellar to schedule MRA. Please advise.

## 2020-03-04 NOTE — Telephone Encounter (Signed)
Spoke with the patient just now and let her know that her MRA would be scheduled at [facility]. This is the closest location for her to go to within our [facility] system. She verbalizes understanding and thanks me for the call back.    Encouraged patient to call back with any questions or concerns.

## 2020-03-04 NOTE — Telephone Encounter (Signed)
Left message on patients voicemail to please return our call.   

## 2020-03-04 NOTE — Telephone Encounter (Signed)
Spoke with the patient just now and let her know that I put in the orders for her to have the MRA of the neck and brain. I let her know that they would be calling her to set this appointment up. I also let her know that she will need to come in and get lab work done prior to this appointment. She verbalizes understanding and thanks me for the call back.

## 2020-03-10 ENCOUNTER — Other Ambulatory Visit: Payer: Self-pay

## 2020-03-10 ENCOUNTER — Ambulatory Visit (INDEPENDENT_AMBULATORY_CARE_PROVIDER_SITE_OTHER): Payer: Medicare Other

## 2020-03-10 DIAGNOSIS — J4540 Moderate persistent asthma, uncomplicated: Secondary | ICD-10-CM

## 2020-03-10 MED ORDER — OMALIZUMAB 150 MG/ML ~~LOC~~ SOSY
150.0000 mg | PREFILLED_SYRINGE | Freq: Once | SUBCUTANEOUS | Status: AC
  Administered 2020-03-10: 150 mg via SUBCUTANEOUS

## 2020-03-10 NOTE — Progress Notes (Signed)
Have you been hospitalized within the last 10 days?  No Do you have a fever?  No Do you have a cough?  No Do you have a headache or sore throat? No Do you have your Epi Pen visible and is it within date?  Yes 

## 2020-03-26 ENCOUNTER — Telehealth: Payer: Self-pay

## 2020-03-26 LAB — BASIC METABOLIC PANEL
BUN/Creatinine Ratio: 22 (ref 12–28)
BUN: 16 mg/dL (ref 8–27)
CO2: 24 mmol/L (ref 20–29)
Calcium: 9.6 mg/dL (ref 8.7–10.3)
Chloride: 107 mmol/L — ABNORMAL HIGH (ref 96–106)
Creatinine, Ser: 0.74 mg/dL (ref 0.57–1.00)
GFR calc Af Amer: 94 mL/min/{1.73_m2} (ref >59)
GFR calc non Af Amer: 81 mL/min/{1.73_m2} (ref >59)
Glucose: 94 mg/dL (ref 65–99)
Potassium: 4.7 mmol/L (ref 3.5–5.2)
Sodium: 142 mmol/L (ref 134–144)

## 2020-03-26 NOTE — Telephone Encounter (Signed)
-----   Message from Baldo Daub, MD sent at 03/26/2020 10:25 AM EDT ----- Normal or stable result

## 2020-03-26 NOTE — Telephone Encounter (Signed)
Spoke with patient regarding results and recommendation.  Patient verbalizes understanding and is agreeable to plan of care. Advised patient to call back with any issues or concerns.  

## 2020-03-27 ENCOUNTER — Other Ambulatory Visit: Payer: Self-pay

## 2020-03-28 ENCOUNTER — Ambulatory Visit (HOSPITAL_BASED_OUTPATIENT_CLINIC_OR_DEPARTMENT_OTHER)
Admission: RE | Admit: 2020-03-28 | Discharge: 2020-03-28 | Disposition: A | Discharge status: Home / Self Care | Payer: Medicare Other | Source: Ambulatory Visit | Attending: Cardiology | Admitting: Cardiology

## 2020-03-28 ENCOUNTER — Other Ambulatory Visit: Payer: Self-pay

## 2020-03-28 DIAGNOSIS — R930 Abnormal findings on diagnostic imaging of skull and head, not elsewhere classified: Secondary | ICD-10-CM

## 2020-03-28 DIAGNOSIS — I6529 Occlusion and stenosis of unspecified carotid artery: Secondary | ICD-10-CM | POA: Diagnosis present

## 2020-03-28 MED ORDER — GADOBUTROL 1 MMOL/ML IV SOLN
7.5000 mL | Freq: Once | INTRAVENOUS | Status: AC | PRN
  Administered 2020-03-28: 7.5 mL via INTRAVENOUS

## 2020-03-30 ENCOUNTER — Telehealth: Payer: Self-pay | Admitting: Internal Medicine

## 2020-03-30 NOTE — Telephone Encounter (Signed)
Xolair Prefilled Syringe Order: 150mg  Prefilled Syringe:  #1 75mg  Prefilled Syringe: #0 Ordered Date: 03/30/2020 Expected date of arrival: 03/31/2020 Ordered by: 05/30/2020, CMA  Specialty Pharmacy: 05/31/2020

## 2020-03-31 NOTE — Telephone Encounter (Signed)
Xolair Prefilled Syringe Received:  150mg  Prefilled Syringe >> quantity #1, lot # , exp date 01/2021 75mg  Prefilled Syringe >> N/A Medication arrival date: 03/31/2020 Received by: , CMA

## 2020-04-07 ENCOUNTER — Other Ambulatory Visit: Payer: Self-pay

## 2020-04-07 ENCOUNTER — Ambulatory Visit (INDEPENDENT_AMBULATORY_CARE_PROVIDER_SITE_OTHER): Payer: Medicare Other

## 2020-04-07 DIAGNOSIS — J4540 Moderate persistent asthma, uncomplicated: Secondary | ICD-10-CM | POA: Diagnosis not present

## 2020-04-07 MED ORDER — OMALIZUMAB 150 MG/ML ~~LOC~~ SOSY
150.0000 mg | PREFILLED_SYRINGE | Freq: Once | SUBCUTANEOUS | Status: AC
  Administered 2020-04-07: 150 mg via SUBCUTANEOUS

## 2020-04-07 NOTE — Progress Notes (Signed)
Have you been hospitalized within the last 10 days?  No Do you have a fever?  No Do you have a cough?  No Do you have a headache or sore throat? No Do you have your Epi Pen visible and is it within date?  Yes 

## 2020-04-08 ENCOUNTER — Encounter: Payer: Self-pay | Admitting: Internal Medicine

## 2020-04-08 ENCOUNTER — Ambulatory Visit (INDEPENDENT_AMBULATORY_CARE_PROVIDER_SITE_OTHER): Payer: Medicare Other | Admitting: Internal Medicine

## 2020-04-08 DIAGNOSIS — F329 Major depressive disorder, single episode, unspecified: Secondary | ICD-10-CM

## 2020-04-08 DIAGNOSIS — I6521 Occlusion and stenosis of right carotid artery: Secondary | ICD-10-CM | POA: Diagnosis not present

## 2020-04-08 DIAGNOSIS — J4540 Moderate persistent asthma, uncomplicated: Secondary | ICD-10-CM

## 2020-04-08 MED ORDER — PREDNISONE 10 MG PO TABS: ORAL_TABLET | ORAL | 3 refills | Status: DC

## 2020-04-08 MED ORDER — CLARITHROMYCIN 500 MG PO TABS: ORAL_TABLET | ORAL | 3 refills | Status: DC

## 2020-04-08 NOTE — Progress Notes (Signed)
Subjective:    Patient ID: Debra Byrd, female    DOB: 05-22-1946, 74 y.o.   MRN: [id number]  HPI Female never smoker followed for chronic obstructive asthma/Xolair/often steroid dependent, complicated by allergic rhinitis, peripheral neuropathy, glaucoma,  Offce Spirometry 08/08/2016-NL. FVC 2.04/77%, FEV1 1.64/81%, FEV1/FVC 0.80 HST 07/26/13- WNL  AHI 4.4/ hr, desaturation to 73%, body weight 175 lbs -------------------------------------------------- 08/20/2019-  74 year old female never smoker followed for chronic obstructive asthma/Xolair/ often steroid dependent, complicated by allergic rhinitis, peripheral neuropathy, Glaucoma,  Advair 100, pro-air HFA, Xolair, neb albuterol, benzonatate -----f/u Moderate persistent asthma Used rescue once a week ago. Not needing neb. Continues to feel Xolair made huge improvement in her quality of life.  Optometrist has her on maintenance doxycycline for chronic dry eye. Asks Biaxin and pred to hold for winter. Refill neb solution.  04/08/20- 74 year old female never smoker followed for chronic obstructive asthma/Xolair/ often steroid dependent, complicated by allergic rhinitis, peripheral neuropathy, Glaucoma,  Advair 100, pro-air HFA, Xolair, neb albuterol, benzonatate ------asthma, feels like her breathing is well, does have some wheezing, sob at times, but feels xolair has helped improve asthma Asthma has been controlled. No concerns with meds and still very pleased with Xolair. Grieving suicide death of 27 yo grandson.   ROS-see HPI-+ = positive Constitutional:   No-   weight loss, night sweats, fevers, chills, +fatigue, lassitude. HEENT:   No-   difficulty swallowing, tooth/dental problems, sore throat,       No-  sneezing, itching, ear ache,  nasal congestion, post nasal drip,  CV:  + Noncardiac chest pain, no- orthopnea, PND, swelling in lower extremities,                                           anasarca, dizziness,  palpitations Resp:+shortness of breath with exertion or at rest.               productive cough,   non-productive cough,  No- coughing up of blood.                change in color of mucus.  +Little wheezing.   Skin: No-   rash or lesions. GI:  No-   heartburn, indigestion, abdominal pain, nausea, vomiting,  GU: pain. MS:  +joint pain or swelling.   Neuro-     nothing unusualk pain. Psych:  No- change in mood or affect. No depression or anxiety.  No memory loss.   Objective:   Physical Exam              General- Alert, Oriented, Affect-appropriate, Distress+ tearful/ crying  + overweight,  Skin- rash-none, lesions- none, excoriation- none Lymphadenopathy- none Head- atraumatic            Eyes- Gross vision intact, PERRLA, conjunctivae clear secretions. Chronic periorbital edema.            Ears- Hearing, canals-normal            Nose- Clear, no-Septal dev, mucus, polyps, erosion, perforation             Throat- Mallampati II-III , mucosa clear , drainage- none, tonsils- atrophic,  Neck- flexible , trachea midline, no stridor , thyroid nl, carotid no bruit Chest - symmetrical excursion , unlabored           Heart/CV- RRR , no murmur , no gallop  , no rub, nl s1 s2                           -  JVD- none , edema- none, stasis changes- none, varices- none           Lung- clear, unlabored, dullness-none, rub- none,  Cough- none           Chest wall-  Abd-  Br/ Gen/ Rectal- Not done, not indicated Extrem- cyanosis- none, clubbing, none, atrophy- none, strength- nl Neuro- grossly intact to observation

## 2020-04-08 NOTE — Patient Instructions (Signed)
We can continue present meds   Scripts sent to hold Biaxin and prednisone

## 2020-04-09 DIAGNOSIS — R109 Unspecified abdominal pain: Secondary | ICD-10-CM

## 2020-04-09 HISTORY — DX: Unspecified abdominal pain: R10.9

## 2020-04-18 DIAGNOSIS — F32A Depression, unspecified: Secondary | ICD-10-CM | POA: Insufficient documentation

## 2020-04-18 HISTORY — DX: Depression, unspecified: F32.A

## 2020-04-18 NOTE — Assessment & Plan Note (Addendum)
Grandson 73 yo suicide She is trying to cope. I asked her to discuss with church, PCP. May need medication help.

## 2020-04-18 NOTE — Assessment & Plan Note (Signed)
Xolair remains beneficial with much better asthma control than prior. Current meds appropriate.

## 2020-04-22 ENCOUNTER — Other Ambulatory Visit: Payer: Self-pay | Admitting: Cardiology

## 2020-04-28 ENCOUNTER — Telehealth: Payer: Self-pay | Admitting: Internal Medicine

## 2020-04-28 NOTE — Telephone Encounter (Signed)
Xolair Prefilled Syringe Order: 150mg  Prefilled Syringe:  #1 75mg  Prefilled Syringe: #N/A Ordered Date: 04/28/20 Expected date of arrival: 9/8-04/30/20 Ordered by: Cherica Heiden,LPN Specialty Pharmacy: 06/28/20

## 2020-04-29 NOTE — Telephone Encounter (Signed)
Xolair Prefilled Syringe Received:  150mg  Prefilled Syringe >> quantity #1, lot # , exp date 04/21/2021 75mg  Prefilled Syringe >> quantity #N/A Medication arrival date: 04/29/20 Received by: Devra Stare,LPN

## 2020-05-05 ENCOUNTER — Ambulatory Visit (INDEPENDENT_AMBULATORY_CARE_PROVIDER_SITE_OTHER): Payer: Medicare Other

## 2020-05-05 ENCOUNTER — Other Ambulatory Visit: Payer: Self-pay

## 2020-05-05 DIAGNOSIS — J4540 Moderate persistent asthma, uncomplicated: Secondary | ICD-10-CM

## 2020-05-05 MED ORDER — OMALIZUMAB 150 MG/ML ~~LOC~~ SOSY
150.0000 mg | PREFILLED_SYRINGE | Freq: Once | SUBCUTANEOUS | Status: AC
  Administered 2020-05-05: 150 mg via SUBCUTANEOUS

## 2020-05-05 NOTE — Progress Notes (Signed)
Have you been hospitalized within the last 10 days?  No Do you have a fever?  No Do you have a cough?  No Do you have a headache or sore throat? No Do you have your Epi Pen visible and is it within date?  Yes 

## 2020-05-18 ENCOUNTER — Telehealth: Payer: Self-pay | Admitting: Pulmonary Disease

## 2020-05-18 NOTE — Telephone Encounter (Signed)
Xolair Prefilled Syringe Order: 150mg Prefilled Syringe:  #1 75mg Prefilled Syringe: #n/a Ordered Date: 05/18/20 Expected date of arrival: 05/20/20 Ordered by: Brentyn Seehafer,LPN Specialty Pharm[facility]: [facility] 

## 2020-05-21 NOTE — Telephone Encounter (Signed)
Xolair Prefilled Syringe Received:  150mg  Prefilled Syringe >> quantity #1, lot # , exp date 04/21/2021 75mg  Prefilled Syringe >> quantity #n/a Medication arrival date: 05/21/2020 Received by: Kalup Jaquith,LPN

## 2020-06-02 ENCOUNTER — Ambulatory Visit (INDEPENDENT_AMBULATORY_CARE_PROVIDER_SITE_OTHER): Payer: Medicare Other

## 2020-06-02 ENCOUNTER — Other Ambulatory Visit: Payer: Self-pay

## 2020-06-02 ENCOUNTER — Telehealth: Payer: Self-pay | Admitting: Internal Medicine

## 2020-06-02 DIAGNOSIS — J4540 Moderate persistent asthma, uncomplicated: Secondary | ICD-10-CM | POA: Diagnosis not present

## 2020-06-02 MED ORDER — OMALIZUMAB 150 MG/ML ~~LOC~~ SOSY
150.0000 mg | PREFILLED_SYRINGE | Freq: Once | SUBCUTANEOUS | Status: AC
  Administered 2020-06-02: 150 mg via SUBCUTANEOUS

## 2020-06-02 NOTE — Telephone Encounter (Signed)
ATC Patient.  LM to call back to schedule flu vaccine. 

## 2020-06-02 NOTE — Progress Notes (Signed)
Have you been hospitalized within the last 10 days?  No Do you have a fever?  No Do you have a cough?  No Do you have a headache or sore throat? No Do you have your Epi Pen visible and is it within date?  Yes 

## 2020-06-04 ENCOUNTER — Encounter: Payer: Self-pay | Admitting: Podiatry

## 2020-06-04 ENCOUNTER — Ambulatory Visit (INDEPENDENT_AMBULATORY_CARE_PROVIDER_SITE_OTHER): Payer: Medicare Other | Admitting: Podiatry

## 2020-06-04 ENCOUNTER — Other Ambulatory Visit: Payer: Self-pay

## 2020-06-04 DIAGNOSIS — B351 Tinea unguium: Secondary | ICD-10-CM | POA: Diagnosis not present

## 2020-06-04 DIAGNOSIS — D689 Coagulation defect, unspecified: Secondary | ICD-10-CM | POA: Diagnosis not present

## 2020-06-04 NOTE — Telephone Encounter (Signed)
Pt returning a phone call to schedule flu shot. Pt can be reached at [phone]

## 2020-06-05 NOTE — Telephone Encounter (Signed)
Spoke with Patient.  Patient scheduled 06/10/20 at 1430 for high dose flu vaccine.

## 2020-06-09 ENCOUNTER — Telehealth: Payer: Self-pay | Admitting: Cardiology

## 2020-06-09 NOTE — Telephone Encounter (Signed)
Patient calling for results of MRA's done back in August.

## 2020-06-09 NOTE — Telephone Encounter (Signed)
Patient informed results were normal.

## 2020-06-10 ENCOUNTER — Ambulatory Visit: Payer: Medicare Other

## 2020-06-10 ENCOUNTER — Other Ambulatory Visit: Payer: Self-pay

## 2020-06-10 ENCOUNTER — Ambulatory Visit (INDEPENDENT_AMBULATORY_CARE_PROVIDER_SITE_OTHER): Payer: Medicare Other

## 2020-06-10 DIAGNOSIS — Z23 Encounter for immunization: Secondary | ICD-10-CM

## 2020-06-15 DIAGNOSIS — N814 Uterovaginal prolapse, unspecified: Secondary | ICD-10-CM

## 2020-06-15 HISTORY — DX: Uterovaginal prolapse, unspecified: N81.4

## 2020-06-16 ENCOUNTER — Telehealth: Payer: Self-pay | Admitting: Internal Medicine

## 2020-06-16 MED ORDER — ALBUTEROL SULFATE HFA 108 (90 BASE) MCG/ACT IN AERS: 2.0000 | INHALATION_SPRAY | Freq: Four times a day (QID) | RESPIRATORY_TRACT | 4 refills | Status: DC | PRN

## 2020-06-16 MED ORDER — EPINEPHRINE 0.3 MG/0.3ML IJ SOAJ: 0.3000 mg | INTRAMUSCULAR | 4 refills | Status: DC | PRN

## 2020-06-16 MED ORDER — FLUTICASONE PROPIONATE 50 MCG/ACT NA SUSP: 2.0000 | Freq: Every day | NASAL | 4 refills | Status: DC

## 2020-06-16 MED ORDER — FLUTICASONE-SALMETEROL 100-50 MCG/DOSE IN AEPB: INHALATION_SPRAY | RESPIRATORY_TRACT | 4 refills | Status: DC

## 2020-06-16 MED ORDER — LORATADINE 10 MG PO TABS: 10.0000 mg | ORAL_TABLET | Freq: Every day | ORAL | 4 refills | Status: DC

## 2020-06-16 NOTE — Telephone Encounter (Signed)
Per Dr. Roxy Cedar request 5 prescriptions have been refilled and sent to [facility]. Nothing further needed at this time.

## 2020-06-16 NOTE — Telephone Encounter (Signed)
Called and spoke with patient who is requesting new prescriptions to be sent in electronically for Advair,Claritin,Flonase, Pro-Air(Albuterol) HFA, Epi Pen sent to [facility]. 90day supply of each for a year **New Prescriptions** Please advise [phone]   Dr. Maple Hudson please advise

## 2020-06-16 NOTE — Telephone Encounter (Signed)
If you can refill those I would appreciate it- Thanks

## 2020-06-21 NOTE — Progress Notes (Signed)
  Subjective:  Patient ID: Debra Byrd, female    DOB: Oct 23, 1945,  MRN: [id number]  Chief Complaint  Patient presents with  . Nail Problem    trim nails    74 y.o. female presents with the above complaint. History confirmed with patient. Hx as above.  Objective:  Physical Exam: warm, good capillary refill, nail exam onychomycosis of the toenails, no trophic changes or ulcerative lesions, normal DP and PT pulses, and normal sensory exam. Punctate hyperkeratosis submet 2 bilat, HPK medial 1st MPJ bilat   No images are attached to the encounter.  Assessment:   1. Onychomycosis   2. Coagulation defect [facility])    Plan:  Patient was evaluated and treated and all questions answered.  Onychomycosis and Coagulation Defect, Porokeratosis -Nails palliatively debrided secondary to pain  Procedure: Nail Debridement Rationale: Patient meets criteria for routine foot care due to coag defect Type of Debridement: manual, sharp debridement. Instrumentation: Nail nipper, rotary burr. Number of Nails: 10

## 2020-06-22 ENCOUNTER — Telehealth: Payer: Self-pay | Admitting: Internal Medicine

## 2020-06-22 NOTE — Telephone Encounter (Signed)
Xolair Prefilled Syringe Order: 150mg  Prefilled Syringe:  #1 75mg  Prefilled Syringe: #n/a Ordered Date: 06/22/20 Expected date of arrival: 06/23/20 Ordered by: Kelliann Pendergraph,LPN Specialty Pharmacy: 13/1/21

## 2020-06-23 NOTE — Telephone Encounter (Signed)
Xolair Prefilled Syringe Received:  150mg  Prefilled Syringe >> quantity #1, lot # , exp date 07/21/2021 75mg  Prefilled Syringe >> quantity #n/a Medication arrival date: 06/23/20 Received by: Deretha Ertle,LPN

## 2020-06-25 ENCOUNTER — Telehealth: Payer: Self-pay | Admitting: Internal Medicine

## 2020-06-26 NOTE — Telephone Encounter (Signed)
Patient calling about rescheduling injection for 11/9.  [phone]

## 2020-06-26 NOTE — Telephone Encounter (Signed)
Spoke with Patient. Patient scheduled 06/30/20 at 1415. Nothing further at this time.

## 2020-06-30 ENCOUNTER — Ambulatory Visit: Payer: Medicare Other

## 2020-06-30 ENCOUNTER — Other Ambulatory Visit: Payer: Self-pay

## 2020-06-30 ENCOUNTER — Ambulatory Visit (INDEPENDENT_AMBULATORY_CARE_PROVIDER_SITE_OTHER): Payer: Medicare Other

## 2020-06-30 DIAGNOSIS — J4540 Moderate persistent asthma, uncomplicated: Secondary | ICD-10-CM

## 2020-06-30 MED ORDER — OMALIZUMAB 150 MG/ML ~~LOC~~ SOSY
150.0000 mg | PREFILLED_SYRINGE | Freq: Once | SUBCUTANEOUS | Status: AC
  Administered 2020-06-30: 150 mg via SUBCUTANEOUS

## 2020-06-30 NOTE — Progress Notes (Signed)
Have you been hospitalized within the last 10 days?  No Do you have a fever?  No Do you have a cough?  No Do you have a headache or sore throat? No Do you have your Epi Pen visible and is it within date?  Yes 

## 2020-07-28 ENCOUNTER — Other Ambulatory Visit: Payer: Self-pay

## 2020-07-28 ENCOUNTER — Ambulatory Visit (INDEPENDENT_AMBULATORY_CARE_PROVIDER_SITE_OTHER): Payer: Medicare Other

## 2020-07-28 DIAGNOSIS — J4540 Moderate persistent asthma, uncomplicated: Secondary | ICD-10-CM

## 2020-07-28 MED ORDER — OMALIZUMAB 150 MG/ML ~~LOC~~ SOSY
150.0000 mg | PREFILLED_SYRINGE | Freq: Once | SUBCUTANEOUS | Status: AC
  Administered 2020-07-28: 150 mg via SUBCUTANEOUS

## 2020-07-28 NOTE — Progress Notes (Signed)
Have you been hospitalized within the last 10 days?  No Do you have a fever?  No Do you have a cough?  No Do you have a headache or sore throat? No  

## 2020-08-10 ENCOUNTER — Telehealth: Payer: Self-pay | Admitting: Internal Medicine

## 2020-08-10 NOTE — Telephone Encounter (Signed)
Xolair Prefilled Syringe Order: 150mg  Prefilled Syringe:  #1 75mg  Prefilled Syringe: #n/a Ordered Date: 08/10/20 Expected date of arrival: 08/11/20 Ordered by: Ronesha Heenan,LPN Specialty Pharmacy: 08/12/20

## 2020-08-11 NOTE — Telephone Encounter (Signed)
Xolair Prefilled Syringe Received:  150mg  Prefilled Syringe >> quantity #1, lot # , exp date 03/20/2021 75mg  Prefilled Syringe >> quantity #n/a Medication arrival date: 08/11/20 Received by: Lorin Gawron,LPN

## 2020-08-25 ENCOUNTER — Telehealth: Payer: Self-pay | Admitting: Internal Medicine

## 2020-08-25 ENCOUNTER — Ambulatory Visit: Payer: Medicare Other

## 2020-08-25 NOTE — Telephone Encounter (Signed)
ATC Patient.  LM to call back to reschedule Xolair injection.

## 2020-08-26 NOTE — Telephone Encounter (Signed)
Patient called office.  Patient stated she was exposed to Covid 08/21/20 and 08/23/20.  Patient stated she has no symptoms at this time.  Patient scheduled for Xolair injection 09/01/20 at 1600. Advised Patient to let us know if she has any Covid like symptoms.

## 2020-09-01 ENCOUNTER — Other Ambulatory Visit: Payer: Self-pay

## 2020-09-01 ENCOUNTER — Ambulatory Visit: Payer: Medicare Other

## 2020-09-01 DIAGNOSIS — J4540 Moderate persistent asthma, uncomplicated: Secondary | ICD-10-CM

## 2020-09-01 MED ORDER — OMALIZUMAB 150 MG/ML ~~LOC~~ SOSY
150.0000 mg | PREFILLED_SYRINGE | Freq: Once | SUBCUTANEOUS | Status: AC
  Administered 2020-09-01: 150 mg via SUBCUTANEOUS

## 2020-09-01 NOTE — Progress Notes (Signed)
Have you been hospitalized within the last 10 days?  No Do you have a fever?  No Do you have a cough?  No Do you have a headache or sore throat? No  

## 2020-09-03 ENCOUNTER — Other Ambulatory Visit: Payer: Self-pay | Admitting: *Deleted

## 2020-09-03 ENCOUNTER — Ambulatory Visit (INDEPENDENT_AMBULATORY_CARE_PROVIDER_SITE_OTHER): Payer: Medicare Other | Admitting: Podiatry

## 2020-09-03 ENCOUNTER — Encounter: Payer: Self-pay | Admitting: Podiatry

## 2020-09-03 ENCOUNTER — Other Ambulatory Visit: Payer: Self-pay

## 2020-09-03 DIAGNOSIS — D689 Coagulation defect, unspecified: Secondary | ICD-10-CM

## 2020-09-03 DIAGNOSIS — B351 Tinea unguium: Secondary | ICD-10-CM | POA: Diagnosis not present

## 2020-09-03 DIAGNOSIS — Q828 Other specified congenital malformations of skin: Secondary | ICD-10-CM

## 2020-09-03 NOTE — Progress Notes (Signed)
  Subjective:  Patient ID: Debra Byrd, female    DOB: February 08, 1946,  MRN: [id number]  Chief Complaint  Patient presents with  . Nail Problem    Trim nails    75 y.o. female presents with the above complaint. History confirmed with patient. Hx as above.  Objective:  Physical Exam: warm, good capillary refill, nail exam onychomycosis of the toenails, no trophic changes or ulcerative lesions, normal DP and PT pulses, and normal sensory exam. Punctate hyperkeratosis submet 2 bilat, HPK medial 1st MPJ bilat   No images are attached to the encounter.  Assessment:   1. Onychomycosis   2. Coagulation defect (HCC)   3. Porokeratosis    Plan:  Patient was evaluated and treated and all questions answered.  Onychomycosis and Coagulation Defect, Porokeratosis -Nails palliatively debrided secondary to pain  Procedure: Nail Debridement Type of Debridement: manual, sharp debridement. Instrumentation: Nail nipper, rotary burr. Number of Nails: 10  Procedure: Paring of Lesion Rationale: painful hyperkeratotic lesion Type of Debridement: manual, sharp debridement. Instrumentation: 312 blade Number of Lesions: 2

## 2020-09-10 ENCOUNTER — Ambulatory Visit: Payer: Medicare Other | Admitting: Podiatry

## 2020-09-21 ENCOUNTER — Telehealth: Payer: Self-pay | Admitting: Internal Medicine

## 2020-09-21 NOTE — Telephone Encounter (Signed)
Xolair Prefilled Syringe Order: 150mg  Prefilled Syringe:  #1 75mg  Prefilled Syringe: #n/a Ordered Date: 09/21/20 Expected date of arrival: 09/22/20 Ordered by: Kyarra Vancamp,LPN Specialty Pharmacy: 09/23/20

## 2020-09-22 DIAGNOSIS — U071 COVID-19: Secondary | ICD-10-CM

## 2020-09-22 HISTORY — DX: COVID-19: U07.1

## 2020-09-22 NOTE — Telephone Encounter (Signed)
Xolair Prefilled Syringe Received:  150mg  Prefilled Syringe >> quantity #1, lot # , exp date 04/21/2021 75mg  Prefilled Syringe >> quantity #n/a Medication arrival date: 09/22/20 Received by: Kingslee Dowse,LPN

## 2020-09-29 ENCOUNTER — Other Ambulatory Visit: Payer: Self-pay

## 2020-09-29 ENCOUNTER — Ambulatory Visit (INDEPENDENT_AMBULATORY_CARE_PROVIDER_SITE_OTHER): Payer: Medicare Other

## 2020-09-29 DIAGNOSIS — J4540 Moderate persistent asthma, uncomplicated: Secondary | ICD-10-CM

## 2020-09-29 MED ORDER — OMALIZUMAB 150 MG/ML ~~LOC~~ SOSY
150.0000 mg | PREFILLED_SYRINGE | Freq: Once | SUBCUTANEOUS | Status: AC
  Administered 2020-09-29: 150 mg via SUBCUTANEOUS

## 2020-09-29 NOTE — Progress Notes (Signed)
Have you been hospitalized within the last 10 days?  No Do you have a fever?  No Do you have a cough?  No Do you have a headache or sore throat? No  

## 2020-09-30 NOTE — Progress Notes (Signed)
Subjective:    Patient ID: Debra Byrd, female    DOB: 03-Jan-1946, 75 y.o.   MRN: [id number]  HPI Female never smoker followed for chronic obstructive asthma/Xolair/often steroid dependent, complicated by allergic rhinitis, peripheral neuropathy, glaucoma,  Offce Spirometry 08/08/2016-NL. FVC 2.04/77%, FEV1 1.64/81%, FEV1/FVC 0.80 HST 07/26/13- WNL  AHI 4.4/ hr, desaturation to 73%, body weight 175 lbs --------------------------------------------------  04/08/20- 75 year old female never smoker followed for chronic obstructive asthma/Xolair/ often steroid dependent, complicated by allergic rhinitis, peripheral neuropathy, Glaucoma,  Advair 100, pro-air HFA, Xolair, neb albuterol, benzonatate ------asthma, feels like her breathing is well, does have some wheezing, sob at times, but feels xolair has helped improve asthma Asthma has been controlled. No concerns with meds and still very pleased with Xolair. Grieving suicide death of 60 yo grandson.  2020-10-17- Virtual Visit via Telephone Note  I connected with Debra Byrd on Oct 17, 2020 at  9:00 AM EST by telephone and verified that I am speaking with the correct person using two identifiers.  Location: Patient- H Provider: O   I discussed the limitations, risks, security and privacy concerns of performing an evaluation and management service by telephone and the availability of in person appointments. I also discussed with the patient that there may be a patient responsible charge related to this service. The patient expressed understanding and agreed to proceed.   History of Present Illness: 74 year old female never smoker followed for chronic obstructive asthma/Xolair/ often steroid dependent, complicated by allergic rhinitis, peripheral neuropathy, Glaucoma,  Advair 100, pro-air HFA, Xolair, neb albuterol, benzonatate Covid vax-2 Phizer Flu vax-had Converted to televisit- daughter has Covid infection. Mild stuffy nose ever since winter  heat came on, but no wheeze, cough or acute illness. Exposed to daughter (disabled, living with parents) who has just tested positive for Covid. We discussed measures to avoid spread, timing of booster. She will wait 5 days then get herself tested for Covid. Otherwise doing very well. Xolair has made a huge difference in asthma control. She is on doxycycline from eye doctor for dry eyes. Asks refill Biaxin and prednisone taper to hold as before.   Observations/Objective:   Assessment and Plan: Asthma moderate uncomplicated Covid exposure  Follow Up Instructions: 1 year   I discussed the assessment and treatment plan with the patient. The patient was provided an opportunity to ask questions and all were answered. The patient agreed with the plan and demonstrated an understanding of the instructions.   The patient was advised to call back or seek an in-person evaluation if the symptoms worsen or if the condition fails to improve as anticipated.  I provided 15 minutes of non-face-to-face time during this encounter.   Jetty Duhamel, MD     ROS-see HPI-+ = positive Constitutional:   No-   weight loss, night sweats, fevers, chills, +fatigue, lassitude. HEENT:   No-   difficulty swallowing, tooth/dental problems, sore throat,       No-  sneezing, itching, ear ache,  nasal congestion, post nasal drip,  CV:  + Noncardiac chest pain, no- orthopnea, PND, swelling in lower extremities,                                           anasarca, dizziness, palpitations Resp:+shortness of breath with exertion or at rest.               productive cough,   non-productive  cough,  No- coughing up of blood.                change in color of mucus.  +Little wheezing.   Skin: No-   rash or lesions. GI:  No-   heartburn, indigestion, abdominal pain, nausea, vomiting,  GU: pain. MS:  +joint pain or swelling.   Neuro-     nothing unusualk pain. Psych:  No- change in mood or affect. No depression or anxiety.   No memory loss.   Objective:   Physical Exam              General- Alert, Oriented, Affect-appropriate, Distress+ tearful/ crying  + overweight,  Skin- rash-none, lesions- none, excoriation- none Lymphadenopathy- none Head- atraumatic            Eyes- Gross vision intact, PERRLA, conjunctivae clear secretions. Chronic periorbital edema.            Ears- Hearing, canals-normal            Nose- Clear, no-Septal dev, mucus, polyps, erosion, perforation             Throat- Mallampati II-III , mucosa clear , drainage- none, tonsils- atrophic,  Neck- flexible , trachea midline, no stridor , thyroid nl, carotid no bruit Chest - symmetrical excursion , unlabored           Heart/CV- RRR , no murmur , no gallop  , no rub, nl s1 s2                           - JVD- none , edema- none, stasis changes- none, varices- none           Lung- clear, unlabored, dullness-none, rub- none,  Cough- none           Chest wall-  Abd-  Br/ Gen/ Rectal- Not done, not indicated Extrem- cyanosis- none, clubbing, none, atrophy- none, strength- nl Neuro- grossly intact to observation

## 2020-10-01 ENCOUNTER — Other Ambulatory Visit: Payer: Self-pay

## 2020-10-01 ENCOUNTER — Encounter: Payer: Self-pay | Admitting: Internal Medicine

## 2020-10-01 ENCOUNTER — Telehealth: Payer: Self-pay | Admitting: Internal Medicine

## 2020-10-01 ENCOUNTER — Ambulatory Visit (INDEPENDENT_AMBULATORY_CARE_PROVIDER_SITE_OTHER): Payer: Medicare Other | Admitting: Internal Medicine

## 2020-10-01 DIAGNOSIS — J3089 Other allergic rhinitis: Secondary | ICD-10-CM

## 2020-10-01 DIAGNOSIS — J302 Other seasonal allergic rhinitis: Secondary | ICD-10-CM

## 2020-10-01 DIAGNOSIS — J4540 Moderate persistent asthma, uncomplicated: Secondary | ICD-10-CM

## 2020-10-01 MED ORDER — CLARITHROMYCIN 500 MG PO TABS: ORAL_TABLET | ORAL | 3 refills | Status: DC

## 2020-10-01 MED ORDER — PREDNISONE 10 MG PO TABS
ORAL_TABLET | ORAL | 3 refills | Status: DC
Start: 2020-10-01 — End: 2020-10-16

## 2020-10-01 NOTE — Patient Instructions (Signed)
I do recommend Covid boster vaccine as discussed. Hope your daughter gets well quickly and that you don't get sick.   Refills were sent for Biaxin and Prednisone taper as before. Hopefully they won't be needed.  I'm glad Xolair continues to help.  Please call if we can help with anything.

## 2020-10-01 NOTE — Telephone Encounter (Signed)
Spoke with CY nurse Amy. Aware of patient request. Voiced understanding. Patient declined video visit stating she does not use technology well. Placed note in appt to make aware appt will need to be a telephone visit.   Call made to patient. Made aware of appt change. Voiced understanding.Aware nurse will call at appt time.

## 2020-10-01 NOTE — Assessment & Plan Note (Signed)
Uncomplicated currently- credits Xolair Plan- continue present meds. Standby refills for Biaxin and prednisone taper again sent with discussion.

## 2020-10-01 NOTE — Assessment & Plan Note (Signed)
Minor seasonal drainage. Uses otc antihistamine and flonase when needed.

## 2020-10-09 ENCOUNTER — Ambulatory Visit: Payer: Medicare Other | Admitting: Internal Medicine

## 2020-10-16 ENCOUNTER — Ambulatory Visit (INDEPENDENT_AMBULATORY_CARE_PROVIDER_SITE_OTHER): Payer: Medicare Other

## 2020-10-16 ENCOUNTER — Other Ambulatory Visit: Payer: Self-pay

## 2020-10-16 ENCOUNTER — Ambulatory Visit (INDEPENDENT_AMBULATORY_CARE_PROVIDER_SITE_OTHER): Payer: Medicare Other | Admitting: Primary Care

## 2020-10-16 ENCOUNTER — Encounter: Payer: Self-pay | Admitting: Primary Care

## 2020-10-16 VITALS — BP 128/78 | HR 68 | Temp 97.3°F | Ht 61.0 in | Wt 165.4 lb

## 2020-10-16 DIAGNOSIS — U071 COVID-19: Secondary | ICD-10-CM | POA: Insufficient documentation

## 2020-10-16 DIAGNOSIS — J4540 Moderate persistent asthma, uncomplicated: Secondary | ICD-10-CM | POA: Diagnosis not present

## 2020-10-16 HISTORY — DX: COVID-19: U07.1

## 2020-10-16 NOTE — Assessment & Plan Note (Signed)
-   Continue Advair 100-66mcg 1 puff BID, Pro-Air HFA and Xolair as prescribed - FU in August with Dr. Maple Hudson

## 2020-10-16 NOTE — Assessment & Plan Note (Signed)
-   Tested positive 10/06/20. Vaccinated, no booster. She was not hospitalized and did not receive monoclonal antibody infusion. She had no significant respiratory symptoms except for sinusitis symptoms which was treated with doxycycline course and prednisone.  - CXR 10/16/2020 showed no acute findings. Left base atelectasis, otherwise lungs were clear - Encourage deep breathing exercises with IS for the next 2 weeks - Repeat CXR in 6-8 weeks

## 2020-10-16 NOTE — Progress Notes (Signed)
@Patient  ID: , female    DOB: Nov 26, 1945, 75 y.o.   MRN: [id number]  Chief Complaint  Patient presents with  . Follow-up    Pt tested positive 10/06/20. Pt states now she does still feel fatigued and also states her chest still doesn't feel back to normal; does have some occ tightness in chest. Pt does also have a productive cough with a tint of yellow and also has some yellow postnasal drainage. Denies any current fever.    Referring provider: 10/08/20, PA-C  HPI: 75 year old female, never smoked.  Past medical history significant for moderate persistent asthma, allergic rhinitis, coronary artery disease, hypertension, mitral valve disease, GERD, fibromyalgia, glaucoma, prediabetes.  Patient of Dr. 65, last seen for televisit on 10/01/2020 d/t covid exposure.  Maintained on Advair, Pro Air HFA and Xolair.  10/16/2020 Patient presents today for follow-up visit/covid. She developed sinus fullness/drainage 2 weeks ago after her daughter had covid. Rapid test/PCR were originally negative. She was treated for sinobronchitis at [facility] 10/04/20 with doxycycline and prednisone course. She later tested positive for Covid on 10/06/20. She was not hospitalized and did not receive monoclonal antibody infusion. She had no significant respiratory symptoms. She has some residual fatigue, sinus congestion and cough. She had some right sided back pain which has resolved. Patient was vaccinated with Pfizer x2, she has not had booster. She has Biaxin and prednisone taper on hand. Denies f/c/s, chest or back pain, GI symptoms.   Allergies  Allergen Reactions  . Iodinated Diagnostic Agents Shortness Of Breath and Swelling    Swelling in facial/No issues with shellfish   . Azithromycin Hives    No problem with Biaxin  . Iodine Swelling  . Levofloxacin     Tendon tears  . Montelukast Other (See Comments)  . Montelukast Sodium Swelling  . No Known Allergies Other (See Comments)  . Cefaclor  Rash  . Penicillins Swelling  . Sulfa Antibiotics Rash  . Sulfonamide Derivatives Rash    Immunization History  Administered Date(s) Administered  . Fluad Quad(high Dose 65+) 06/06/2019, 06/10/2020  . Influenza Split 05/30/2011, 05/31/2012, 06/16/2014, 05/25/2015  . Influenza Whole 05/21/2008, 06/07/2010  . Influenza, High Dose Seasonal PF 05/26/2016, 06/13/2017, 06/14/2018  . Influenza,inj,Quad PF,6+ Mos 06/17/2013  . PFIZER(Purple Top)SARS-COV-2 Vaccination 08/24/2019, 09/28/2019  . Pneumococcal Conjugate-13 07/10/2013  . Pneumococcal Polysaccharide-23 08/22/1998  . Tdap 02/28/2011  . Zoster 05/09/2016    Past Medical History:  Diagnosis Date  . Allergic rhinitis, cause unspecified   . Asthma   . Chronic airway obstruction, not elsewhere classified   . Coronary atherosclerosis of unspecified type of vessel, native or graft   . Mitral valve disorders(424.0)   . Mononeuritis of unspecified site   . Myalgia and myositis, unspecified   . Peripheral vascular disease, unspecified (HCC)   . Sleep disturbance, unspecified   . Unspecified glaucoma(365.9)     Tobacco History: Social History   Tobacco Use  Smoking Status Never Smoker  Smokeless Tobacco Never Used   Counseling given: Not Answered   Outpatient Medications Prior to Visit  Medication Sig Dispense Refill  . albuterol (PROAIR HFA) 108 (90 Base) MCG/ACT inhaler Inhale 2 puffs into the lungs every 6 (six) hours as needed for wheezing. 56 g 4  . albuterol (PROVENTIL) (2.5 MG/3ML) 0.083% nebulizer solution Take 3 mLs (2.5 mg total) by nebulization 4 (four) times daily as needed for wheezing. 360 mL 12  . alclomethasone (ACLOVATE) 0.05 % ointment Apply 1 application topically as  needed.     . Ascorbic Acid (VITAMIN C) 1000 MG tablet Take 1,000 mg by mouth daily.    . calcium gluconate 500 MG tablet Take 500 mg by mouth 2 (two) times daily.      . calcium-vitamin D (OSCAL WITH D) 500-200 MG-UNIT TABS tablet Take by  mouth.    . cholecalciferol (VITAMIN D) 1000 UNITS tablet Take 3,000 Units by mouth daily.    . clopidogrel (PLAVIX) 75 MG tablet TAKE 1 TABLET BY MOUTH EVERY DAY 90 tablet 3  . Coenzyme Q10 (COQ10) 100 MG CAPS Take 1 capsule by mouth daily.    Marland Kitchen dexlansoprazole (DEXILANT) 60 MG capsule Take 60 mg by mouth daily.    . diclofenac sodium (VOLTAREN) 1 % GEL Apply 4 g topically 4 (four) times daily. 100 g 1  . doxycycline (ADOXA) 50 MG tablet Take 50 mg by mouth daily.    . DUREZOL 0.05 % EMUL     . EPINEPHrine 0.3 mg/0.3 mL IJ SOAJ injection Inject 0.3 mg into the muscle as needed for anaphylaxis. 6 each 4  . estradiol (ESTRACE) 0.1 MG/GM vaginal cream Apply pea sized amount to vaginal M-W-F    . finasteride (PROSCAR) 5 MG tablet Take 2.5 mg by mouth daily.    . fish oil-omega-3 fatty acids 1000 MG capsule 4 by mouth daily    . fluticasone (FLONASE) 50 MCG/ACT nasal spray Place 2 sprays into both nostrils daily. 48 g 4  . Fluticasone-Salmeterol (ADVAIR DISKUS) 100-50 MCG/DOSE AEPB Inhale 1 puff and rinse mouth, twice daily 180 each 4  . gabapentin (NEURONTIN) 300 MG capsule Take 300 mg by mouth at bedtime.  3  . Latanoprost (XELPROS) 0.005 % EMUL Apply to eye.    . loratadine (CLARITIN) 10 MG tablet Take 1 tablet (10 mg total) by mouth daily. 90 tablet 4  . meclizine (ANTIVERT) 12.5 MG tablet Take 12.5 mg by mouth 3 (three) times daily as needed.    . methocarbamol (ROBAXIN) 750 MG tablet Take 750 mg by mouth every 6 (six) hours as needed.    . Multiple Vitamins-Minerals (ZINC PO) Take 1 tablet by mouth daily.    . ondansetron (ZOFRAN) 4 MG tablet Take 4 mg by mouth every 8 (eight) hours as needed.    . pyridOXINE (VITAMIN B-6) 25 MG tablet Take by mouth.    . RESTASIS 0.05 % ophthalmic emulsion     . rosuvastatin (CRESTOR) 5 MG tablet TAKE 1 TABLET BY MOUTH EVERY DAY 90 tablet 1  . telmisartan (MICARDIS) 20 MG tablet Take 1 tablet (20 mg total) by mouth at bedtime. 90 tablet 1  . vitamin B-12  (CYANOCOBALAMIN) 1000 MCG tablet Take 1,000 mcg by mouth daily.    . clarithromycin (BIAXIN) 500 MG tablet 1 twice daily after meals (Patient not taking: Reported on 10/16/2020) 20 tablet 3  . clindamycin (CLEOCIN) 300 MG capsule Take 300 mg by mouth daily as needed (Dental Work). 1 hour before dental work (Patient not taking: Reported on 10/16/2020)  0  . Calcium Carbonate-Vit D-Min (CALCIUM 1200 PO) Take 1,200 mg by mouth daily.    . methylPREDNISolone (MEDROL DOSEPAK) 4 MG TBPK tablet Take by mouth.    . predniSONE (DELTASONE) 10 MG tablet 4 X 2 DAYS, 3 X 2 DAYS, 2 X 2 DAYS, 1 X 2 DAYS 20 tablet 3  . traMADol (ULTRAM) 50 MG tablet Take 50 mg by mouth 2 (two) times daily as needed.     No facility-administered  medications prior to visit.   Review of Systems  Review of Systems  Constitutional: Negative.   HENT: Positive for postnasal drip.   Respiratory: Negative for cough, shortness of breath and wheezing.    Physical Exam  BP 128/78 (BP Location: Right Arm, Cuff Size: Normal)   Pulse 68   Temp (!) 97.3 F (36.3 C) (Temporal)   Ht 5\' 1"  (1.549 m)   Wt 165 lb 6.4 oz (75 kg)   SpO2 99%   BMI 31.25 kg/m  Physical Exam Constitutional:      Appearance: Normal appearance.  HENT:     Head: Normocephalic and atraumatic.     Mouth/Throat:     Comments: Deferred d/t masking Cardiovascular:     Rate and Rhythm: Normal rate and regular rhythm.  Pulmonary:     Effort: Pulmonary effort is normal.     Breath sounds: Normal breath sounds.  Musculoskeletal:        General: Normal range of motion.  Skin:    General: Skin is warm and dry.  Neurological:     General: No focal deficit present.     Mental Status: She is alert and oriented to person, place, and time. Mental status is at baseline.  Psychiatric:        Mood and Affect: Mood normal.        Behavior: Behavior normal.        Thought Content: Thought content normal.        Judgment: Judgment normal.      Lab  Results:  CBC No results found for: WBC, RBC, HGB, HCT, PLT, MCV, MCH, MCHC, RDW, LYMPHSABS, MONOABS, EOSABS, BASOSABS  BMET    Component Value Date/Time   NA 142 03/25/2020 1101   K 4.7 03/25/2020 1101   CL 107 (H) 03/25/2020 1101   CO2 24 03/25/2020 1101   GLUCOSE 94 03/25/2020 1101   BUN 16 03/25/2020 1101   CREATININE 0.74 03/25/2020 1101   CALCIUM 9.6 03/25/2020 1101   GFRNONAA 81 03/25/2020 1101   GFRAA 94 03/25/2020 1101    BNP No results found for: BNP  ProBNP No results found for: PROBNP  Imaging: DG Chest 2 View  Result Date: 10/16/2020 CLINICAL DATA:  Reported recent COVID-19 positive. History of asthma EXAM: CHEST - 2 VIEW COMPARISON:  March 28, 2018 FINDINGS: There is atelectatic change in the left base. There is no edema or airspace opacity. Heart size and pulmonary vascularity are normal. No adenopathy. There is degenerative change in the thoracic spine. Evidence of prior trauma in the lateral right clavicle region with postoperative change in the right shoulder region, stable. IMPRESSION: Atelectasis left base. Lungs elsewhere clear. Heart size within normal limits. Electronically Signed   By: March 30, 2018 III M.D.   On: 10/16/2020 11:54     Assessment & Plan:   COVID-19 virus infection - Tested positive 10/06/20. Vaccinated, no booster. She was not hospitalized and did not receive monoclonal antibody infusion. She had no significant respiratory symptoms except for sinusitis symptoms which was treated with doxycycline course and prednisone.  - CXR 10/16/2020 showed no acute findings. Left base atelectasis, otherwise lungs were clear - Encourage deep breathing exercises with IS for the next 2 weeks - Repeat CXR in 6-8 weeks   Asthma, moderate persistent - Continue Advair 100-90mcg 1 puff BID, Pro-Air HFA and Xolair as prescribed - FU in August with Dr. September, NP 10/16/2020

## 2020-10-16 NOTE — Patient Instructions (Addendum)
Test results: - CXR showed atelectasis left base. Lungs elsewhere clear. Heart size within normal limits.  Recommendations: - Continue Advair, Pro Air HFA and Xolair as prescribed  - Encourage deep breathing exercises with Incentive spirometer x10/hour while awake for the next 1-2 weeks - You can get covid booster 90 days after testing positive  Orders: - Chest x-ray re: asthma/Covid-19  Follow-up: - 6 month recall was placed at last visit (due August 2022)

## 2020-10-19 ENCOUNTER — Telehealth: Payer: Self-pay | Admitting: Internal Medicine

## 2020-10-19 ENCOUNTER — Other Ambulatory Visit (HOSPITAL_COMMUNITY): Payer: Self-pay | Admitting: Pharmacy Technician

## 2020-10-19 DIAGNOSIS — J4541 Moderate persistent asthma with (acute) exacerbation: Secondary | ICD-10-CM | POA: Insufficient documentation

## 2020-10-19 DIAGNOSIS — J4540 Moderate persistent asthma, uncomplicated: Secondary | ICD-10-CM | POA: Insufficient documentation

## 2020-10-19 HISTORY — DX: Moderate persistent asthma with (acute) exacerbation: J45.41

## 2020-10-19 NOTE — Telephone Encounter (Signed)
Xolair Prefilled Syringe Order: 150mg  Prefilled Syringe:  #1 75mg  Prefilled Syringe: #n/a Ordered Date: 10/19/20 Expected date of arrival: 10/20/20 Ordered by: Jendayi Berling,LPN Specialty Pharmacy: 10/21/20

## 2020-10-20 NOTE — Telephone Encounter (Signed)
Xolair Prefilled Syringe Received:  150mg  Prefilled Syringe >> quantity #1, lot # , exp date 07/21/2021 75mg  Prefilled Syringe >> quantity #n/a Medication arrival date: 10/20/20 Received by: Versia Mignogna,LPN

## 2020-10-21 ENCOUNTER — Other Ambulatory Visit: Payer: Self-pay | Admitting: Cardiology

## 2020-10-27 ENCOUNTER — Other Ambulatory Visit: Payer: Self-pay

## 2020-10-27 ENCOUNTER — Ambulatory Visit (INDEPENDENT_AMBULATORY_CARE_PROVIDER_SITE_OTHER): Payer: Medicare Other

## 2020-10-27 DIAGNOSIS — J4540 Moderate persistent asthma, uncomplicated: Secondary | ICD-10-CM

## 2020-10-27 MED ORDER — OMALIZUMAB 150 MG/ML ~~LOC~~ SOSY
150.0000 mg | PREFILLED_SYRINGE | Freq: Once | SUBCUTANEOUS | Status: AC
  Administered 2020-10-27: 150 mg via SUBCUTANEOUS

## 2020-10-27 NOTE — Progress Notes (Signed)
Have you been hospitalized within the last 10 days?  No Do you have a fever?  No Do you have a cough?  No Do you have a headache or sore throat? No  

## 2020-11-24 ENCOUNTER — Ambulatory Visit (INDEPENDENT_AMBULATORY_CARE_PROVIDER_SITE_OTHER): Payer: Medicare Other

## 2020-11-24 ENCOUNTER — Other Ambulatory Visit: Payer: Self-pay

## 2020-11-24 VITALS — BP 126/74 | HR 62 | Temp 98.0°F | Resp 16

## 2020-11-24 DIAGNOSIS — J4540 Moderate persistent asthma, uncomplicated: Secondary | ICD-10-CM

## 2020-11-24 MED ORDER — METHYLPREDNISOLONE SODIUM SUCC 125 MG IJ SOLR: 125.0000 mg | Freq: Once | INTRAMUSCULAR | Status: DC | PRN

## 2020-11-24 MED ORDER — EPINEPHRINE 0.3 MG/0.3ML IJ SOAJ: 0.3000 mg | Freq: Once | INTRAMUSCULAR | Status: DC | PRN

## 2020-11-24 MED ORDER — DIPHENHYDRAMINE HCL 50 MG/ML IJ SOLN: 50.0000 mg | Freq: Once | INTRAMUSCULAR | Status: DC | PRN

## 2020-11-24 MED ORDER — ALBUTEROL SULFATE HFA 108 (90 BASE) MCG/ACT IN AERS: 2.0000 | INHALATION_SPRAY | Freq: Once | RESPIRATORY_TRACT | Status: DC | PRN

## 2020-11-24 MED ORDER — SODIUM CHLORIDE 0.9 % IV SOLN: Freq: Once | INTRAVENOUS | Status: DC | PRN

## 2020-11-24 MED ORDER — OMALIZUMAB 150 MG/ML ~~LOC~~ SOSY
150.0000 mg | PREFILLED_SYRINGE | Freq: Once | SUBCUTANEOUS | Status: AC
  Administered 2020-11-24: 150 mg via SUBCUTANEOUS

## 2020-11-24 MED ORDER — FAMOTIDINE IN NACL 20-0.9 MG/50ML-% IV SOLN: 20.0000 mg | Freq: Once | INTRAVENOUS | Status: DC | PRN

## 2020-11-24 NOTE — Progress Notes (Signed)
Diagnosis: Asthma  Provider:  Chilton Greathouse, MD  Procedure: Injection  Xolair (Omalizumab), Dose: 150 mg, Site: subcutaneous left hip  Discharge: Condition: Good, Destination: Home . AVS provided to patient.   Performed by:  Nat Math, RN

## 2020-11-26 ENCOUNTER — Ambulatory Visit: Payer: Medicare Other | Admitting: Podiatry

## 2020-12-02 ENCOUNTER — Encounter: Payer: Self-pay | Admitting: Cardiology

## 2020-12-02 ENCOUNTER — Other Ambulatory Visit: Payer: Self-pay

## 2020-12-02 ENCOUNTER — Telehealth: Payer: Self-pay | Admitting: Cardiology

## 2020-12-02 ENCOUNTER — Ambulatory Visit (INDEPENDENT_AMBULATORY_CARE_PROVIDER_SITE_OTHER): Payer: Medicare Other | Admitting: Cardiology

## 2020-12-02 VITALS — BP 160/82 | HR 60 | Ht 61.5 in | Wt 168.0 lb

## 2020-12-02 DIAGNOSIS — I6521 Occlusion and stenosis of right carotid artery: Secondary | ICD-10-CM

## 2020-12-02 DIAGNOSIS — J4540 Moderate persistent asthma, uncomplicated: Secondary | ICD-10-CM | POA: Diagnosis not present

## 2020-12-02 DIAGNOSIS — R0789 Other chest pain: Secondary | ICD-10-CM | POA: Diagnosis not present

## 2020-12-02 DIAGNOSIS — U071 COVID-19: Secondary | ICD-10-CM | POA: Diagnosis not present

## 2020-12-02 HISTORY — DX: Other chest pain: R07.89

## 2020-12-02 NOTE — Progress Notes (Signed)
Cardiology Office Note:    Date:  12/02/2020   ID:  Debra Byrd, DOB [birth year], MRN [id number]  PCP:  Leane Call, PA-C  Cardiologist:  Gypsy Balsam, MD    Referring MD: Leane Call, PA-C   Chief Complaint  Patient presents with  . Chest pressure/fullness    History of Present Illness:    Debra Byrd is a 75 y.o. female with past medical history significant peripheral vascular disease in form of right carotid arterial stenosis, mild mitral valve prolapse, mild mitral regurgitation, essential hypertension, dyslipidemia.  She requested to be seen because she has been complaining of having some heartburn/fullness like sensation in the chest.  She said this is something that being on for few weeks already.  She take her morning medication and then after that she started feeling the sensation.  There is not much she can do to relieve this there is also not much she can do to make it worse.  There is no shortness of breath no sweating associated with this sensation.  She is very worried about this.  Past Medical History:  Diagnosis Date  . Allergic rhinitis, cause unspecified   . Asthma   . Chronic airway obstruction, not elsewhere classified   . Coronary atherosclerosis of unspecified type of vessel, native or graft   . COVID-19 09/2020  . Mitral valve disorders(424.0)   . Mononeuritis of unspecified site   . Myalgia and myositis, unspecified   . Peripheral vascular disease, unspecified (HCC)   . Sleep disturbance, unspecified   . Unspecified glaucoma(365.9)     Past Surgical History:  Procedure Laterality Date  . ROTATOR CUFF REPAIR      Current Medications: Current Meds  Medication Sig  . albuterol (PROAIR HFA) 108 (90 Base) MCG/ACT inhaler Inhale 2 puffs into the lungs every 6 (six) hours as needed for wheezing.  Marland Kitchen albuterol (PROVENTIL) (2.5 MG/3ML) 0.083% nebulizer solution Take 3 mLs (2.5 mg total) by nebulization 4 (four) times daily as needed for  wheezing.  Marland Kitchen alclomethasone (ACLOVATE) 0.05 % ointment Apply 1 application topically as needed (rash).  . Ascorbic Acid (VITAMIN C) 1000 MG tablet Take 1,000 mg by mouth daily.  . calcium gluconate 500 MG tablet Take 500 mg by mouth 2 (two) times daily.    . cholecalciferol (VITAMIN D) 1000 UNITS tablet Take 3,000 Units by mouth daily.  . clarithromycin (BIAXIN) 500 MG tablet 1 twice daily after meals (Patient taking differently: Take by mouth 2 (two) times daily. 1 twice daily after meals)  . clindamycin (CLEOCIN) 300 MG capsule Take 300 mg by mouth daily as needed (Dental Work). 1 hour before dental work  . clopidogrel (PLAVIX) 75 MG tablet TAKE 1 TABLET BY MOUTH EVERY DAY (Patient taking differently: Take 75 mg by mouth daily.)  . Coenzyme Q10 (COQ10) 100 MG CAPS Take 1 capsule by mouth daily.  Marland Kitchen dexlansoprazole (DEXILANT) 60 MG capsule Take 60 mg by mouth daily.  . diclofenac sodium (VOLTAREN) 1 % GEL Apply 4 g topically 4 (four) times daily.  Marland Kitchen doxycycline (ADOXA) 50 MG tablet Take 50 mg by mouth daily.  . DUREZOL 0.05 % EMUL Apply 1 mL topically as needed (dry skin).  Marland Kitchen EPINEPHrine 0.3 mg/0.3 mL IJ SOAJ injection Inject 0.3 mg into the muscle as needed for anaphylaxis.  Marland Kitchen estradiol (ESTRACE) 0.1 MG/GM vaginal cream Place 1 Applicatorful vaginally as needed (hormones).  . finasteride (PROSCAR) 5 MG tablet Take 2.5 mg by mouth daily.  Marland Kitchen  fish oil-omega-3 fatty acids 1000 MG capsule Take 1 g by mouth daily. 4 by mouth daily  . fluticasone (FLONASE) 50 MCG/ACT nasal spray Place 2 sprays into both nostrils daily.  . Fluticasone-Salmeterol (ADVAIR DISKUS) 100-50 MCG/DOSE AEPB Inhale 1 puff and rinse mouth, twice daily (Patient taking differently: Inhale 1 puff into the lungs 2 (two) times daily. Inhale 1 puff and rinse mouth, twice daily)  . gabapentin (NEURONTIN) 300 MG capsule Take 300 mg by mouth at bedtime.  . Latanoprost (XELPROS) 0.005 % EMUL Place 1 drop into both eyes daily.  Marland Kitchen loratadine  (CLARITIN) 10 MG tablet Take 1 tablet (10 mg total) by mouth daily.  . meclizine (ANTIVERT) 12.5 MG tablet Take 12.5 mg by mouth 3 (three) times daily as needed for dizziness.  . methocarbamol (ROBAXIN) 750 MG tablet Take 750 mg by mouth every 6 (six) hours as needed for muscle spasms.  . Multiple Vitamins-Minerals (ZINC PO) Take 1 tablet by mouth daily. Unknown strength  . pyridOXINE (VITAMIN B-6) 25 MG tablet Take 25 mg by mouth daily.  . RESTASIS 0.05 % ophthalmic emulsion Place 1 drop into both eyes 2 (two) times daily.  . rosuvastatin (CRESTOR) 5 MG tablet TAKE 1 TABLET BY MOUTH EVERY DAY (Patient taking differently: Take by mouth daily.)  . telmisartan (MICARDIS) 20 MG tablet Take 1 tablet (20 mg total) by mouth at bedtime.  . vitamin B-12 (CYANOCOBALAMIN) 1000 MCG tablet Take 1,000 mcg by mouth daily.  . [DISCONTINUED] calcium-vitamin D (OSCAL WITH D) 500-200 MG-UNIT TABS tablet Take 1 tablet by mouth daily.     Allergies:   Iodinated diagnostic agents, Azithromycin, Iodine, Levofloxacin, Montelukast, Montelukast sodium, No known allergies, Cefaclor, Penicillins, Sulfa antibiotics, and Sulfonamide derivatives   Social History   Socioeconomic History  . Marital status: Married    Spouse name: Not on file  . Number of children: Not on file  . Years of education: Not on file  . Highest education level: Not on file  Occupational History  . Not on file  Tobacco Use  . Smoking status: Never Smoker  . Smokeless tobacco: Never Used  Vaping Use  . Vaping Use: Never used  Substance and Sexual Activity  . Alcohol use: No  . Drug use: No  . Sexual activity: Not on file  Other Topics Concern  . Not on file  Social History Narrative  . Not on file   Social Determinants of Health   Financial Resource Strain: Not on file  Food Insecurity: Not on file  Transportation Needs: Not on file  Physical Activity: Not on file  Stress: Not on file  Social Connections: Not on file      Family History: The patient's family history includes Emphysema in her maternal uncle; Heart disease in her father. ROS:   Please see the history of present illness.    All 14 point review of systems negative except as described per history of present illness  EKGs/Labs/Other Studies Reviewed:      Recent Labs: 03/25/2020: BUN 16; Creatinine, Ser 0.74; Potassium 4.7; Sodium 142  Recent Lipid Panel    Component Value Date/Time   CHOL 145 08/08/2018 0940   TRIG 85 08/08/2018 0940   HDL 67 08/08/2018 0940   CHOLHDL 2.2 08/08/2018 0940   LDLCALC 61 08/08/2018 0940    Physical Exam:    VS:  BP (!) 160/82 (BP Location: Left Arm, Patient Position: Sitting)   Pulse 60   Ht 5' 1.5" (1.562 m)  Wt 168 lb (76.2 kg)   SpO2 92%   BMI 31.23 kg/m     Wt Readings from Last 3 Encounters:  12/02/20 168 lb (76.2 kg)  10/16/20 165 lb 6.4 oz (75 kg)  04/08/20 170 lb 9.6 oz (77.4 kg)     GEN:  Well nourished, well developed in no acute distress HEENT: Normal NECK: No JVD; No carotid bruits LYMPHATICS: No lymphadenopathy CARDIAC: RRR, no murmurs, no rubs, no gallops RESPIRATORY:  Clear to auscultation without rales, wheezing or rhonchi  ABDOMEN: Soft, non-tender, non-distended MUSCULOSKELETAL:  No edema; No deformity  SKIN: Warm and dry LOWER EXTREMITIES: no swelling NEUROLOGIC:  Alert and oriented x 3 PSYCHIATRIC:  Normal affect   ASSESSMENT:    1. Stenosis of right carotid artery   2. COVID-19   3. Atypical chest pain   4. Moderate persistent asthma without complication    PLAN:    In order of problems listed above:  1. Carotic arterial stenosis, she did have however MRI of the head and neck done in summer of last year which showed normal carotic arteries. 2. Atypical chest pain.  I will schedule her to have a stress test make sure she does not have any significant coronary artery disease however symptomatology is somewhat atypical.  We will think about doing coronary CT  angio however because of her severe allergy to iodine contrast dye we will not be able to pursue that way.  Apparently she had some anaphylactic reaction to iodine years ago. 3. Moderate persistent asthma to be follow-up and managed by internal medicine team. 4. Dyslipidemia, her LDL is 61 HDL 67 she is on Crestor 5 mg daily which I will continue.   Medication Adjustments/Labs and Tests Ordered: Current medicines are reviewed at length with the patient today.  Concerns regarding medicines are outlined above.  No orders of the defined types were placed in this encounter.  Medication changes: No orders of the defined types were placed in this encounter.   Signed, Georgeanna Lea, MD, [facility] 12/02/2020 2:25 PM    [facility]

## 2020-12-02 NOTE — Addendum Note (Signed)
Addended by: Hazle Quant on: 12/02/2020 02:32 PM   Modules accepted: Orders

## 2020-12-02 NOTE — Telephone Encounter (Signed)
Pt states that she has been having Acid Reflux after swallowing her pills for 1 week and pain on her right side under her shoulder blade, Pt states the pain under her right shoulder blade could have come from coughing a lot but she's not sure. Pt would like a return call on her cell #. Please advise

## 2020-12-02 NOTE — Telephone Encounter (Signed)
Pt states that she has a fullness feeling in her throat/chest after medication but this am she had pain in her shoulder blade. Pt is requesting an appointment as she is concerned that it could be her heart as she is a female. Appointment made with Dr. Bing Matter. Advised to call her GI doctor as well.

## 2020-12-02 NOTE — Patient Instructions (Signed)
Medication Instructions:  Your physician recommends that you continue on your current medications as directed. Please refer to the Current Medication list given to you today.  *If you need a refill on your cardiac medications before your next appointment, please call your pharmacy*   Lab Work: Your physician recommends that you return for lab work today: troponin  If you have labs (blood work) drawn today and your tests are completely normal, you will receive your results only by: Marland Kitchen MyChart Message (if you have MyChart) OR . A paper copy in the mail If you have any lab test that is abnormal or we need to change your treatment, we will call you to review the results.   Testing/Procedures:   [facility] [address] [place], [place] [address] Phone:  [phone]    Please arrive 15 minutes prior to your appointment time for registration and insurance purposes.  The test will take approximately 3 to 4 hours to complete; you may bring reading material.  If someone comes with you to your appointment, they will need to remain in the main lobby due to limited space in the testing area. **If you are pregnant or breastfeeding, please notify the nuclear lab prior to your appointment**  How to prepare for your Myocardial Perfusion Test: . Do not eat or drink 3 hours prior to your test, except you may have water. . Do not consume products containing caffeine (regular or decaffeinated) 12 hours prior to your test. (ex: coffee, chocolate, sodas, tea). . Do bring a list of your current medications with you.  If not listed below, you may take your medications as normal.  . Do wear comfortable clothes (no dresses or overalls) and walking shoes, tennis shoes preferred (No heels or open toe shoes are allowed). . Do NOT wear cologne, perfume, aftershave, or lotions (deodorant is allowed). . If these instructions are not followed, your test will have to be  rescheduled.  Please report to [address] for your test.  If you have questions or concerns about your appointment, you can call the [facility] at [phone].  If you cannot keep your appointment, please provide 24 hours notification to the Nuclear Lab, to avoid a possible $50 charge to your account.    Follow-Up: At [facility], you and your health needs are our priority.  As part of our continuing mission to provide you with exceptional heart care, we have created designated Provider Care Teams.  These Care Teams include your primary Cardiologist (physician) and Advanced Practice Providers (APPs -  Physician Assistants and Nurse Practitioners) who all work together to provide you with the care you need, when you need it.  We recommend signing up for the patient portal called "MyChart".  Sign up information is provided on this After Visit Summary.  MyChart is used to connect with patients for Virtual Visits (Telemedicine).  Patients are able to view lab/test results, encounter notes, upcoming appointments, etc.  Non-urgent messages can be sent to your provider as well.   To learn more about what you can do with MyChart, go to [email or web address].    Your next appointment:   3 month(s)  The format for your next appointment:   In Person  Provider:   Gypsy Balsam, MD   Other Instructions   Cardiac Nuclear Scan A cardiac nuclear scan is a test that measures blood flow to the heart when a person is resting and when he or  she is exercising. The test looks for problems such as:  Not enough blood reaching a portion of the heart.  The heart muscle not working normally. You may need this test if:  You have heart disease.  You have had abnormal lab results.  You have had heart surgery or a balloon procedure to open up blocked arteries (angioplasty).  You have chest pain.  You have shortness of breath. In this test, a  radioactive dye (tracer) is injected into your bloodstream. After the tracer has traveled to your heart, an imaging device is used to measure how much of the tracer is absorbed by or distributed to various areas of your heart. This procedure is usually done at a hospital and takes 2-4 hours. Tell a health care provider about:  Any allergies you have.  All medicines you are taking, including vitamins, herbs, eye drops, creams, and over-the-counter medicines.  Any problems you or family members have had with anesthetic medicines.  Any blood disorders you have.  Any surgeries you have had.  Any medical conditions you have.  Whether you are pregnant or may be pregnant. What are the risks? Generally, this is a safe procedure. However, problems may occur, including:  Serious chest pain and heart attack. This is only a risk if the stress portion of the test is done.  Rapid heartbeat.  Sensation of warmth in your chest. This usually passes quickly.  Allergic reaction to the tracer. What happens before the procedure?  Ask your health care provider about changing or stopping your regular medicines. This is especially important if you are taking diabetes medicines or blood thinners.  Follow instructions from your health care provider about eating or drinking restrictions.  Remove your jewelry on the day of the procedure. What happens during the procedure?  An IV will be inserted into one of your veins.  Your health care provider will inject a small amount of radioactive tracer through the IV.  You will wait for 20-40 minutes while the tracer travels through your bloodstream.  Your heart activity will be monitored with an electrocardiogram (ECG).  You will lie down on an exam table.  Images of your heart will be taken for about 15-20 minutes.  You may also have a stress test. For this test, one of the following may be done: ? You will exercise on a treadmill or stationary bike.  While you exercise, your heart's activity will be monitored with an ECG, and your blood pressure will be checked. ? You will be given medicines that will increase blood flow to parts of your heart. This is done if you are unable to exercise.  When blood flow to your heart has peaked, a tracer will again be injected through the IV.  After 20-40 minutes, you will get back on the exam table and have more images taken of your heart.  Depending on the type of tracer used, scans may need to be repeated 3-4 hours later.  Your IV line will be removed when the procedure is over. The procedure may vary among health care providers and hospitals. What happens after the procedure?  Unless your health care provider tells you otherwise, you may return to your normal schedule, including diet, activities, and medicines.  Unless your health care provider tells you otherwise, you may increase your fluid intake. This will help to flush the contrast dye from your body. Drink enough fluid to keep your urine pale yellow.  Ask your health care provider, or the  department that is doing the test: ? When will my results be ready? ? How will I get my results? Summary  A cardiac nuclear scan measures the blood flow to the heart when a person is resting and when he or she is exercising.  Tell your health care provider if you are pregnant.  Before the procedure, ask your health care provider about changing or stopping your regular medicines. This is especially important if you are taking diabetes medicines or blood thinners.  After the procedure, unless your health care provider tells you otherwise, increase your fluid intake. This will help flush the contrast dye from your body.  After the procedure, unless your health care provider tells you otherwise, you may return to your normal schedule, including diet, activities, and medicines. This information is not intended to replace advice given to you by your health  care provider. Make sure you discuss any questions you have with your health care provider. Document Revised: 01/22/2018 Document Reviewed: 01/22/2018 Elsevier Patient Education  2021 [employer].

## 2020-12-03 ENCOUNTER — Ambulatory Visit (INDEPENDENT_AMBULATORY_CARE_PROVIDER_SITE_OTHER): Payer: Medicare Other | Admitting: Podiatry

## 2020-12-03 ENCOUNTER — Telehealth: Payer: Self-pay

## 2020-12-03 DIAGNOSIS — B351 Tinea unguium: Secondary | ICD-10-CM

## 2020-12-03 DIAGNOSIS — Q828 Other specified congenital malformations of skin: Secondary | ICD-10-CM

## 2020-12-03 DIAGNOSIS — D689 Coagulation defect, unspecified: Secondary | ICD-10-CM | POA: Diagnosis not present

## 2020-12-03 LAB — TROPONIN T: Troponin T (Highly Sensitive): 11 ng/L (ref 0–14)

## 2020-12-03 NOTE — Telephone Encounter (Signed)
Spoke with the patient's husband per her DPR. Detailed instructions given. Asked to call back with any questions. S.Willams EMTP

## 2020-12-03 NOTE — Progress Notes (Signed)
  Subjective:  Patient ID: Debra Byrd, female    DOB: Sep 01, 1945,  MRN: [id number]  Chief Complaint  Patient presents with  . debride    RFC -pt on plavix   75 y.o. female presents with the above complaint. History confirmed with patient. Hx as above.  Objective:  Physical Exam: warm, good capillary refill, nail exam onychomycosis of the toenails, no trophic changes or ulcerative lesions, normal DP and PT pulses, and normal sensory exam. Punctate hyperkeratosis submet 2 bilat, HPK medial 1st MPJ bilat   No images are attached to the encounter.  Assessment:   1. Onychomycosis   2. Porokeratosis   3. Coagulation defect [facility])    Plan:  Patient was evaluated and treated and all questions answered.  Onychomycosis and Coagulation Defect, Porokeratosis -Nails palliatively debrided secondary to pain   Procedure: Nail Debridement Type of Debridement: manual, sharp debridement. Instrumentation: Nail nipper, rotary burr. Number of Nails: 10   Procedure: Paring of Lesion Rationale: painful hyperkeratotic lesion Type of Debridement: manual, sharp debridement. Instrumentation: 312 blade Number of Lesions: 2

## 2020-12-04 DIAGNOSIS — J9811 Atelectasis: Secondary | ICD-10-CM | POA: Insufficient documentation

## 2020-12-04 HISTORY — DX: Atelectasis: J98.11

## 2020-12-08 NOTE — Addendum Note (Signed)
Addended by: Gypsy Balsam on: 12/08/2020 10:08 AM   Modules accepted: Orders

## 2020-12-09 ENCOUNTER — Ambulatory Visit (INDEPENDENT_AMBULATORY_CARE_PROVIDER_SITE_OTHER): Payer: Medicare Other

## 2020-12-09 ENCOUNTER — Other Ambulatory Visit: Payer: Self-pay

## 2020-12-09 DIAGNOSIS — R0789 Other chest pain: Secondary | ICD-10-CM

## 2020-12-09 LAB — MYOCARDIAL PERFUSION IMAGING
LV dias vol: 65 mL (ref 46–106)
LV sys vol: 18 mL
Peak HR: 89 {beats}/min
Rest HR: 55 {beats}/min
SDS: 3
SRS: 0
SSS: 3
TID: 1.06

## 2020-12-09 MED ORDER — TECHNETIUM TC 99M TETROFOSMIN IV KIT
30.4000 | PACK | Freq: Once | INTRAVENOUS | Status: AC | PRN
  Administered 2020-12-09: 30.4 via INTRAVENOUS

## 2020-12-09 MED ORDER — REGADENOSON 0.4 MG/5ML IV SOLN
0.4000 mg | Freq: Once | INTRAVENOUS | Status: AC
  Administered 2020-12-09: 0.4 mg via INTRAVENOUS

## 2020-12-09 MED ORDER — TECHNETIUM TC 99M TETROFOSMIN IV KIT
10.8000 | PACK | Freq: Once | INTRAVENOUS | Status: AC | PRN
  Administered 2020-12-09: 10.8 via INTRAVENOUS

## 2020-12-11 ENCOUNTER — Telehealth: Payer: Self-pay

## 2020-12-11 NOTE — Telephone Encounter (Signed)
Patient notified of test results 

## 2020-12-11 NOTE — Telephone Encounter (Signed)
-----   Message from Georgeanna Lea, MD sent at 12/10/2020  1:04 PM EDT ----- Stress test is normal, negative

## 2020-12-22 ENCOUNTER — Other Ambulatory Visit: Payer: Self-pay

## 2020-12-22 ENCOUNTER — Ambulatory Visit (INDEPENDENT_AMBULATORY_CARE_PROVIDER_SITE_OTHER): Payer: Medicare Other

## 2020-12-22 ENCOUNTER — Telehealth: Payer: Self-pay | Admitting: Internal Medicine

## 2020-12-22 VITALS — BP 130/69 | HR 54 | Temp 97.9°F | Resp 20

## 2020-12-22 DIAGNOSIS — J4540 Moderate persistent asthma, uncomplicated: Secondary | ICD-10-CM | POA: Diagnosis not present

## 2020-12-22 MED ORDER — ALBUTEROL SULFATE HFA 108 (90 BASE) MCG/ACT IN AERS: 2.0000 | INHALATION_SPRAY | Freq: Once | RESPIRATORY_TRACT | Status: DC | PRN

## 2020-12-22 MED ORDER — DIPHENHYDRAMINE HCL 50 MG/ML IJ SOLN: 50.0000 mg | Freq: Once | INTRAMUSCULAR | Status: DC | PRN

## 2020-12-22 MED ORDER — FAMOTIDINE IN NACL 20-0.9 MG/50ML-% IV SOLN: 20.0000 mg | Freq: Once | INTRAVENOUS | Status: DC | PRN

## 2020-12-22 MED ORDER — SODIUM CHLORIDE 0.9 % IV SOLN: Freq: Once | INTRAVENOUS | Status: DC | PRN

## 2020-12-22 MED ORDER — OMALIZUMAB 150 MG/ML ~~LOC~~ SOSY
150.0000 mg | PREFILLED_SYRINGE | Freq: Once | SUBCUTANEOUS | Status: AC
Start: 2020-12-22 — End: 2020-12-22
  Administered 2020-12-22: 150 mg via SUBCUTANEOUS
  Filled 2020-12-22: qty 1

## 2020-12-22 MED ORDER — METHYLPREDNISOLONE SODIUM SUCC 125 MG IJ SOLR: 125.0000 mg | Freq: Once | INTRAMUSCULAR | Status: DC | PRN

## 2020-12-22 MED ORDER — EPINEPHRINE 0.3 MG/0.3ML IJ SOAJ: 0.3000 mg | Freq: Once | INTRAMUSCULAR | Status: DC | PRN

## 2020-12-22 NOTE — Progress Notes (Signed)
Diagnosis: Asthma  Provider:  Chilton Greathouse, MD  Procedure: Injection  Xolair (Omalizumab), Dose: 150 mg, Site: subcutaneous  Discharge: Condition: Good, Destination: Home . Pt declined AVS.  Performed by:  Nat Math, RN

## 2020-12-22 NOTE — Telephone Encounter (Signed)
I called and spoke with patient regarding Beth's recs. Patient verbalized understanding, nothing further needed.

## 2020-12-22 NOTE — Telephone Encounter (Signed)
I spoke with patient regarding CXR done on 12/04/20 byy PCP. It can be found in Care Everywhere. Patient is requesting Beth NP look at new CXR to get recs and see if CT that was mentioned at last OV is still needed and to get recs on following steps. Also if patient needs to start using spirometer again. Notified patient will route to [facility] and reach out when we hear back. Patient verbalized understanding.  Beth, please advise. Thanks!

## 2020-12-22 NOTE — Telephone Encounter (Signed)
I apologize, CXR done outside our network do not come to my in-basket for review. CXR in April showed hyperinflation and chronic changes, overall improved aeration. Persisting linear opacity at the left lung base compatible with scarring/atelectasis from covid. I do not think she needs CT chest unless she is having worsening respiratory symptoms.

## 2021-01-08 ENCOUNTER — Telehealth: Payer: Self-pay | Admitting: Internal Medicine

## 2021-01-08 NOTE — Telephone Encounter (Signed)
Patient receives Xolair 150mg  every 4 weeks. Last injection was 12/22/20. Next injection would be 01/19/21.  Current appt is 5 weeks after last dose  Routed to infusion center scheduling team  01/21/21, PharmD, MPH Clinical Pharmacist (Rheumatology and Pulmonology)

## 2021-01-08 NOTE — Telephone Encounter (Signed)
Scheduling dept is aware and will make adjustments for 5/31 appt

## 2021-01-16 ENCOUNTER — Other Ambulatory Visit: Payer: Self-pay | Admitting: Cardiology

## 2021-01-19 ENCOUNTER — Ambulatory Visit (INDEPENDENT_AMBULATORY_CARE_PROVIDER_SITE_OTHER): Payer: Medicare Other | Admitting: *Deleted

## 2021-01-19 ENCOUNTER — Other Ambulatory Visit: Payer: Self-pay

## 2021-01-19 VITALS — BP 144/73 | HR 60 | Temp 97.9°F | Resp 16

## 2021-01-19 DIAGNOSIS — J4540 Moderate persistent asthma, uncomplicated: Secondary | ICD-10-CM

## 2021-01-19 MED ORDER — METHYLPREDNISOLONE SODIUM SUCC 125 MG IJ SOLR: 125.0000 mg | Freq: Once | INTRAMUSCULAR | Status: DC | PRN

## 2021-01-19 MED ORDER — ALBUTEROL SULFATE HFA 108 (90 BASE) MCG/ACT IN AERS: 2.0000 | INHALATION_SPRAY | Freq: Once | RESPIRATORY_TRACT | Status: DC | PRN

## 2021-01-19 MED ORDER — EPINEPHRINE 0.3 MG/0.3ML IJ SOAJ: 0.3000 mg | Freq: Once | INTRAMUSCULAR | Status: DC | PRN

## 2021-01-19 MED ORDER — OMALIZUMAB 150 MG/ML ~~LOC~~ SOSY
150.0000 mg | PREFILLED_SYRINGE | Freq: Once | SUBCUTANEOUS | Status: AC
  Administered 2021-01-19: 150 mg via SUBCUTANEOUS
  Filled 2021-01-19: qty 1

## 2021-01-19 MED ORDER — FAMOTIDINE IN NACL 20-0.9 MG/50ML-% IV SOLN: 20.0000 mg | Freq: Once | INTRAVENOUS | Status: DC | PRN

## 2021-01-19 MED ORDER — SODIUM CHLORIDE 0.9 % IV SOLN: Freq: Once | INTRAVENOUS | Status: DC | PRN

## 2021-01-19 MED ORDER — DIPHENHYDRAMINE HCL 50 MG/ML IJ SOLN: 50.0000 mg | Freq: Once | INTRAMUSCULAR | Status: DC | PRN

## 2021-01-19 NOTE — Telephone Encounter (Signed)
Refill sent to pharmacy.   

## 2021-01-19 NOTE — Progress Notes (Signed)
Diagnosis: Moderate persistent asthma  Provider:  Chilton Greathouse, MD  Procedure: Injection  Xolair (Omalizumab), Dose: 150 mg, Site: subcutaneous  Discharge: Condition: Good, Destination: Home . AVS provided to patient.   Performed by:  Forrest Moron, RN

## 2021-01-21 NOTE — Progress Notes (Signed)
Cardiology Office Note:    Date:  01/22/2021   ID:  Debra Byrd, DOB 14-Aug-1946, MRN [id number]  PCP:  Leane Call, PA-C  Cardiologist:  Norman Herrlich, MD    Referring MD: Leane Call, PA-C    ASSESSMENT:    1. Palpitations   2. Hypokalemia    PLAN:    In order of problems listed above:  1. She has a background history of symptomatic APCs likely have the same by time she got to the hospital had resolved this time and on think she requires a monitor 2. We will keep her on potassium supplement to milliequivalents per day 3. Hypertension hyperlipidemia are stable 4. Follow-up in 6 months reassess carotids afterwards   Next appointment: 6 months   Medication Adjustments/Labs and Tests Ordered: Current medicines are reviewed at length with the patient today.  Concerns regarding medicines are outlined above.  No orders of the defined types were placed in this encounter.  No orders of the defined types were placed in this encounter.   Chief Complaint  Patient presents with  . Follow-up    History of Present Illness:    Debra Byrd is a 75 y.o. female with a hx of atrial premature contractions hypertension hyperlipidemia and right internal carotid artery stenosis.  She has severe asthma and follows with pulmonary.  Other problems have included mild mitral regurgitation with fen-phen therapy and chronic back pain.  She was last seen 01/30/2020.  She saw my partner Dr. Bing Matter 12/02/2020 with chest pain high-sensitivity troponin was normal and subsequent Lexiscan Myoview showed EF 73% and normal perfusion low risk test.  Her last carotid MRA performed 03/28/2020 showed less than 50% narrowing of the right internal carotid artery which was stable.  Compliance with diet, lifestyle and medications: Yes  Has background history of symptomatic APCs intermittently has symptoms she went to the emergency room on 28 May because of her sister for about 10 minutes.  In the  ED she had no arrhythmia CBC BMP was normal with the exception that she is mildly hypokalemic 3.4 they gave her a supplement for 7 days and ongoing to keep her on potassium supplementation. She inquires about a monitor she has had no symptoms since and I just do not think placing a monitor would change her treatment. If it is worsened she will contact my office and we will place a event monitor for 14 days. Her GI symptoms have resolved after endoscopy and combined PPI H2 blocker She had no shortness of breath chest pain or syncope. Past Medical History:  Diagnosis Date  . Allergic rhinitis, cause unspecified   . Asthma   . Chronic airway obstruction, not elsewhere classified   . Coronary atherosclerosis of unspecified type of vessel, native or graft   . COVID-19 09/2020  . Mitral valve disorders(424.0)   . Mononeuritis of unspecified site   . Myalgia and myositis, unspecified   . Peripheral vascular disease, unspecified (HCC)   . Sleep disturbance, unspecified   . Unspecified glaucoma(365.9)     Past Surgical History:  Procedure Laterality Date  . ROTATOR CUFF REPAIR      Current Medications: Current Meds  Medication Sig  . albuterol (PROAIR HFA) 108 (90 Base) MCG/ACT inhaler Inhale 2 puffs into the lungs every 6 (six) hours as needed for wheezing.  Marland Kitchen albuterol (PROVENTIL) (2.5 MG/3ML) 0.083% nebulizer solution Take 3 mLs (2.5 mg total) by nebulization 4 (four) times daily as needed for wheezing.  Marland Kitchen  alclomethasone (ACLOVATE) 0.05 % ointment Apply 1 application topically as needed (rash).  . Ascorbic Acid (VITAMIN C) 1000 MG tablet Take 1,000 mg by mouth daily.  . calcium gluconate 500 MG tablet Take 500 mg by mouth 2 (two) times daily.    . cholecalciferol (VITAMIN D) 1000 UNITS tablet Take 3,000 Units by mouth daily.  . clarithromycin (BIAXIN) 500 MG tablet Take 500 mg by mouth as needed (infection).  . clopidogrel (PLAVIX) 75 MG tablet Take 75 mg by mouth daily.  . Coenzyme Q10  (COQ10) 100 MG CAPS Take 1 capsule by mouth daily.  Marland Kitchen dexlansoprazole (DEXILANT) 60 MG capsule Take 60 mg by mouth daily.  . diclofenac sodium (VOLTAREN) 1 % GEL Apply 4 g topically 4 (four) times daily.  Marland Kitchen doxycycline (ADOXA) 50 MG tablet Take 50 mg by mouth daily.  . DUREZOL 0.05 % EMUL Apply 1 mL topically as needed (dry skin).  Marland Kitchen EPINEPHrine 0.3 mg/0.3 mL IJ SOAJ injection Inject 0.3 mg into the muscle as needed for anaphylaxis.  Marland Kitchen estradiol (ESTRACE) 0.1 MG/GM vaginal cream Place 1 Applicatorful vaginally as needed (hormones).  . famotidine (PEPCID) 40 MG tablet Take 40 mg by mouth 2 (two) times daily.  . finasteride (PROSCAR) 5 MG tablet Take 2.5 mg by mouth daily.  . fish oil-omega-3 fatty acids 1000 MG capsule Take 1 g by mouth daily. 4 by mouth daily  . fluticasone (FLONASE) 50 MCG/ACT nasal spray Place 2 sprays into both nostrils daily.  . fluticasone-salmeterol (ADVAIR) 100-50 MCG/ACT AEPB Inhale 1 puff into the lungs 2 (two) times daily.  Marland Kitchen gabapentin (NEURONTIN) 300 MG capsule Take 300 mg by mouth at bedtime.  . Latanoprost (XELPROS) 0.005 % EMUL Place 1 drop into both eyes daily.  Marland Kitchen loratadine (CLARITIN) 10 MG tablet Take 1 tablet (10 mg total) by mouth daily.  . meclizine (ANTIVERT) 12.5 MG tablet Take 12.5 mg by mouth 3 (three) times daily as needed for dizziness.  . methocarbamol (ROBAXIN) 750 MG tablet Take 750 mg by mouth every 6 (six) hours as needed for muscle spasms.  . Multiple Vitamins-Minerals (ZINC PO) Take 1 tablet by mouth daily. Unknown strength  . omalizumab Geoffry Paradise) 150 MG/ML prefilled syringe Inject 150 mg into the skin every 28 (twenty-eight) days.  . potassium chloride (KLOR-CON) 10 MEQ tablet Take 10 mEq by mouth daily.  Marland Kitchen pyridOXINE (VITAMIN B-6) 25 MG tablet Take 25 mg by mouth daily.  . RESTASIS 0.05 % ophthalmic emulsion Place 1 drop into both eyes 2 (two) times daily.  . rosuvastatin (CRESTOR) 5 MG tablet TAKE 1 TABLET BY MOUTH EVERY DAY  . telmisartan  (MICARDIS) 20 MG tablet Take 1 tablet (20 mg total) by mouth at bedtime.  . vitamin B-12 (CYANOCOBALAMIN) 1000 MCG tablet Take 1,000 mcg by mouth daily.     Allergies:   Iodinated diagnostic agents, Azithromycin, Iodine, Levofloxacin, Montelukast, Montelukast sodium, No known allergies, Cefaclor, Penicillins, Sulfa antibiotics, and Sulfonamide derivatives   Social History   Socioeconomic History  . Marital status: Married    Spouse name: Not on file  . Number of children: Not on file  . Years of education: Not on file  . Highest education level: Not on file  Occupational History  . Not on file  Tobacco Use  . Smoking status: Never Smoker  . Smokeless tobacco: Never Used  Vaping Use  . Vaping Use: Never used  Substance and Sexual Activity  . Alcohol use: No  . Drug use: No  .  Sexual activity: Not on file  Other Topics Concern  . Not on file  Social History Narrative  . Not on file   Social Determinants of Health   Financial Resource Strain: Not on file  Food Insecurity: Not on file  Transportation Needs: Not on file  Physical Activity: Not on file  Stress: Not on file  Social Connections: Not on file     Family History: The patient's family history includes Emphysema in her maternal uncle; Heart disease in her father. ROS:   Please see the history of present illness.    All other systems reviewed and are negative.  EKGs/Labs/Other Studies Reviewed:    The following studies were reviewed today:  EKG:  EKG at [facility] ED showed sinus rhythm and was normal Recent Labs: 03/25/2020: BUN 16; Creatinine, Ser 0.74; Potassium 4.7; Sodium 142  Recent Lipid Panel    Component Value Date/Time   CHOL 145 08/08/2018 0940   TRIG 85 08/08/2018 0940   HDL 67 08/08/2018 0940   CHOLHDL 2.2 08/08/2018 0940   LDLCALC 61 08/08/2018 0940    Physical Exam:    VS:  BP 110/80 (BP Location: Left Arm, Patient Position: Sitting, Cuff Size: Normal)   Pulse 74   Ht 5' 1.5" (1.562 m)    Wt 163 lb (73.9 kg)   SpO2 97%   BMI 30.30 kg/m     Wt Readings from Last 3 Encounters:  01/22/21 163 lb (73.9 kg)  12/09/20 168 lb (76.2 kg)  12/02/20 168 lb (76.2 kg)     GEN:  Well nourished, well developed in no acute distress HEENT: Normal NECK: No JVD; No carotid bruits LYMPHATICS: No lymphadenopathy CARDIAC: RRR, no murmurs, rubs, gallops RESPIRATORY:  Clear to auscultation without rales, wheezing or rhonchi  ABDOMEN: Soft, non-tender, non-distended MUSCULOSKELETAL:  No edema; No deformity  SKIN: Warm and dry NEUROLOGIC:  Alert and oriented x 3 PSYCHIATRIC:  Normal affect    Signed, Norman Herrlich, MD  01/22/2021 10:53 AM    [facility]

## 2021-01-22 ENCOUNTER — Encounter: Payer: Self-pay | Admitting: Cardiology

## 2021-01-22 ENCOUNTER — Ambulatory Visit (INDEPENDENT_AMBULATORY_CARE_PROVIDER_SITE_OTHER): Payer: Medicare Other | Admitting: Cardiology

## 2021-01-22 ENCOUNTER — Other Ambulatory Visit: Payer: Self-pay

## 2021-01-22 VITALS — BP 110/80 | HR 74 | Ht 61.5 in | Wt 163.0 lb

## 2021-01-22 DIAGNOSIS — R002 Palpitations: Secondary | ICD-10-CM

## 2021-01-22 DIAGNOSIS — E876 Hypokalemia: Secondary | ICD-10-CM

## 2021-01-22 MED ORDER — POTASSIUM CHLORIDE ER 10 MEQ PO TBCR: 10.0000 meq | EXTENDED_RELEASE_TABLET | Freq: Every day | ORAL | 3 refills | Status: DC

## 2021-01-22 NOTE — Patient Instructions (Signed)

## 2021-01-22 NOTE — Addendum Note (Signed)
Addended by: Delorse Limber I on: 01/22/2021 10:57 AM   Modules accepted: Orders

## 2021-01-26 ENCOUNTER — Ambulatory Visit: Payer: Medicare Other

## 2021-02-16 ENCOUNTER — Ambulatory Visit (INDEPENDENT_AMBULATORY_CARE_PROVIDER_SITE_OTHER): Payer: Medicare Other

## 2021-02-16 ENCOUNTER — Other Ambulatory Visit: Payer: Self-pay

## 2021-02-16 VITALS — BP 145/85 | HR 54 | Temp 97.9°F | Resp 18

## 2021-02-16 DIAGNOSIS — J4540 Moderate persistent asthma, uncomplicated: Secondary | ICD-10-CM | POA: Diagnosis not present

## 2021-02-16 MED ORDER — FAMOTIDINE IN NACL 20-0.9 MG/50ML-% IV SOLN: 20.0000 mg | Freq: Once | INTRAVENOUS | Status: DC | PRN

## 2021-02-16 MED ORDER — SODIUM CHLORIDE 0.9 % IV SOLN: Freq: Once | INTRAVENOUS | Status: DC | PRN

## 2021-02-16 MED ORDER — METHYLPREDNISOLONE SODIUM SUCC 125 MG IJ SOLR: 125.0000 mg | Freq: Once | INTRAMUSCULAR | Status: DC | PRN

## 2021-02-16 MED ORDER — EPINEPHRINE 0.3 MG/0.3ML IJ SOAJ: 0.3000 mg | Freq: Once | INTRAMUSCULAR | Status: DC | PRN

## 2021-02-16 MED ORDER — DIPHENHYDRAMINE HCL 50 MG/ML IJ SOLN: 50.0000 mg | Freq: Once | INTRAMUSCULAR | Status: DC | PRN

## 2021-02-16 MED ORDER — OMALIZUMAB 150 MG/ML ~~LOC~~ SOSY
150.0000 mg | PREFILLED_SYRINGE | Freq: Once | SUBCUTANEOUS | Status: AC
Start: 2021-02-16 — End: 2021-02-16
  Administered 2021-02-16: 150 mg via SUBCUTANEOUS

## 2021-02-16 MED ORDER — ALBUTEROL SULFATE HFA 108 (90 BASE) MCG/ACT IN AERS: 2.0000 | INHALATION_SPRAY | Freq: Once | RESPIRATORY_TRACT | Status: DC | PRN

## 2021-02-16 NOTE — Progress Notes (Addendum)
Diagnosis: Asthma  Provider:  Praveen Mannam, MD  Procedure: Injection  Xolair (Omalizumab), Dose: 150 mg, Site: subcutaneous  Discharge: Condition: Good, Destination: Home . AVS provided to patient.   Performed by:  Manasseh Pittsley, RN       

## 2021-02-23 ENCOUNTER — Ambulatory Visit: Payer: Medicare Other

## 2021-02-24 ENCOUNTER — Ambulatory Visit: Payer: Medicare Other | Admitting: Cardiology

## 2021-02-28 ENCOUNTER — Other Ambulatory Visit: Payer: Self-pay | Admitting: Cardiology

## 2021-03-04 ENCOUNTER — Other Ambulatory Visit: Payer: Self-pay

## 2021-03-04 ENCOUNTER — Ambulatory Visit (INDEPENDENT_AMBULATORY_CARE_PROVIDER_SITE_OTHER): Payer: Medicare Other | Admitting: Podiatry

## 2021-03-04 ENCOUNTER — Ambulatory Visit: Payer: Medicare Other | Admitting: Podiatry

## 2021-03-04 ENCOUNTER — Encounter: Payer: Self-pay | Admitting: Podiatry

## 2021-03-04 DIAGNOSIS — B351 Tinea unguium: Secondary | ICD-10-CM | POA: Diagnosis not present

## 2021-03-04 DIAGNOSIS — Q828 Other specified congenital malformations of skin: Secondary | ICD-10-CM

## 2021-03-04 DIAGNOSIS — D689 Coagulation defect, unspecified: Secondary | ICD-10-CM

## 2021-03-04 NOTE — Progress Notes (Signed)
  Subjective:  Patient ID: Debra Byrd, female    DOB: [birth year],  MRN: [id number]  Chief Complaint  Patient presents with   Nail Problem    Trim nails    75 y.o. female presents with the above complaint. History confirmed with patient. Hx as above.  Objective:  Physical Exam: warm, good capillary refill, nail exam onychomycosis of the toenails, no trophic changes or ulcerative lesions, normal DP and PT pulses, and normal sensory exam. Punctate hyperkeratosis submet 2 bilat, HPK medial 1st MPJ bilat   No images are attached to the encounter.  Assessment:   1. Onychomycosis   2. Coagulation defect (HCC)   3. Porokeratosis    Plan:  Patient was evaluated and treated and all questions answered.  Onychomycosis and Coagulation Defect, Porokeratosis -Nails palliatively debrided secondary to pain  Procedure: Nail Debridement Type of Debridement: manual, sharp debridement. Instrumentation: Nail nipper, rotary burr. Number of Nails: 10  Procedure: Paring of Lesion Rationale: painful hyperkeratotic lesion Type of Debridement: manual, sharp debridement. Instrumentation: tissue nipper Number of Lesions: 1

## 2021-03-16 ENCOUNTER — Other Ambulatory Visit: Payer: Self-pay

## 2021-03-16 ENCOUNTER — Ambulatory Visit (INDEPENDENT_AMBULATORY_CARE_PROVIDER_SITE_OTHER): Payer: Medicare Other | Admitting: *Deleted

## 2021-03-16 VITALS — BP 112/65 | HR 68 | Temp 97.9°F | Resp 16

## 2021-03-16 DIAGNOSIS — J4540 Moderate persistent asthma, uncomplicated: Secondary | ICD-10-CM

## 2021-03-16 MED ORDER — METHYLPREDNISOLONE SODIUM SUCC 125 MG IJ SOLR: 125.0000 mg | Freq: Once | INTRAMUSCULAR | Status: DC | PRN

## 2021-03-16 MED ORDER — DIPHENHYDRAMINE HCL 50 MG/ML IJ SOLN: 50.0000 mg | Freq: Once | INTRAMUSCULAR | Status: DC | PRN

## 2021-03-16 MED ORDER — FAMOTIDINE IN NACL 20-0.9 MG/50ML-% IV SOLN: 20.0000 mg | Freq: Once | INTRAVENOUS | Status: DC | PRN

## 2021-03-16 MED ORDER — OMALIZUMAB 150 MG/ML ~~LOC~~ SOSY
150.0000 mg | PREFILLED_SYRINGE | Freq: Once | SUBCUTANEOUS | Status: AC
  Administered 2021-03-16: 150 mg via SUBCUTANEOUS
  Filled 2021-03-16: qty 1

## 2021-03-16 MED ORDER — SODIUM CHLORIDE 0.9 % IV SOLN: Freq: Once | INTRAVENOUS | Status: DC | PRN

## 2021-03-16 MED ORDER — EPINEPHRINE 0.3 MG/0.3ML IJ SOAJ: 0.3000 mg | Freq: Once | INTRAMUSCULAR | Status: DC | PRN

## 2021-03-16 MED ORDER — ALBUTEROL SULFATE HFA 108 (90 BASE) MCG/ACT IN AERS: 2.0000 | INHALATION_SPRAY | Freq: Once | RESPIRATORY_TRACT | Status: DC | PRN

## 2021-03-16 NOTE — Progress Notes (Signed)
Diagnosis: Asthma  Provider:  Praveen Mannam, MD  Procedure: Injection  Xolair (Omalizumab), Dose: 150 mg, Site: subcutaneous  Discharge: Condition: Good, Destination: Home . AVS provided to patient.   Performed by:  Cheronda Erck A, RN        

## 2021-03-23 ENCOUNTER — Ambulatory Visit: Payer: Medicare Other

## 2021-03-24 ENCOUNTER — Telehealth: Payer: Self-pay | Admitting: Pharmacy Technician

## 2021-03-24 ENCOUNTER — Other Ambulatory Visit: Payer: Self-pay | Admitting: Pharmacy Technician

## 2021-03-24 NOTE — Telephone Encounter (Signed)
Auth Submission: NO AUTH REQUIRED - PATIENT TRACKING  Payer: MEDICARE/BCBS Medication & CPT/J Code(s) submitted: Xolair (Omalizumab) [id number] Route of submission (phone, fax, portal): PHONE [phone] Auth type: Buy/Bill Units/visits requested:  Reference number: [id number]

## 2021-04-02 ENCOUNTER — Ambulatory Visit: Payer: Medicare Other | Admitting: Internal Medicine

## 2021-04-12 NOTE — Progress Notes (Addendum)
Subjective:    Patient ID: Debra Byrd, female    DOB: [birth year], 75 y.o.   MRN: [id number]  HPI Female never smoker followed for chronic obstructive asthma/Xolair/often steroid dependent, complicated by allergic rhinitis, peripheral neuropathy, glaucoma,  [place]ce Spirometry 08/08/2016-NL. FVC 2.04/77%, FEV1 1.64/81%, FEV1/FVC 0.80 HST 07/26/13- WNL  AHI 4.4/ hr, desaturation to 73%, body weight 175 lbs --------------------------------------------------   10/01/20- Virtual Visit via Telephone Note  History of Present Illness: 75 year old female never smoker followed for chronic obstructive asthma/Xolair/ often steroid dependent, complicated by allergic rhinitis, peripheral neuropathy, Glaucoma,  Advair 100, pro-air HFA, Xolair, neb albuterol, benzonatate Covid vax-2 Phizer Flu vax-had Converted to televisit- daughter has Covid infection. Mild stuffy nose ever since winter heat came on, but no wheeze, cough or acute illness. Exposed to daughter (disabled, living with parents) who has just tested positive for Covid. We discussed measures to avoid spread, timing of booster. She will wait 5 days then get herself tested for Covid. Otherwise doing very well. Xolair has made a huge difference in asthma control. She is on doxycycline from eye doctor for dry eyes. Asks refill Biaxin and prednisone taper to hold as before.   Observations/Objective:   Assessment and Plan: Asthma moderate uncomplicated Covid exposure  Follow Up Instructions: 1 year   I8/23/22- 76 year old female never smoker followed for chronic obstructive asthma/Xolair/ often steroid dependent, complicated by allergic rhinitis, peripheral neuropathy, Glaucoma, Covid infection Feb 2022,  -Advair 100, pro-air HFA, Xolair, neb albuterol, benzonatate Covid vax-2 Phizer Had cough and substernal soreness earlier this year.  Cardiology w/u  neg. Second acid blocker added and doing well now. Considers asthma control good and  credits Xolair. Addenddum 06/30/21- Patient indicates she cannot self-inject due to glaucoma and peripheral neuropathy.  CXR 10/16/20- IMPRESSION: Atelectasis left base. Lungs elsewhere clear. Heart size within normal limits.  ROS-see HPI-+ = positive Constitutional:   No-   weight loss, night sweats, fevers, chills, +fatigue, lassitude. HEENT:   No-   difficulty swallowing, tooth/dental problems, sore throat,       No-  sneezing, itching, ear ache,  nasal congestion, post nasal drip,  CV:  + Noncardiac chest pain, no- orthopnea, PND, swelling in lower extremities,                                           anasarca, dizziness, palpitations Resp:+shortness of breath with exertion or at rest.               productive cough,   non-productive cough,  No- coughing up of blood.                change in color of mucus.  +Little wheezing.   Skin: No-   rash or lesions. GI:  No-   heartburn, indigestion, abdominal pain, nausea, vomiting,  GU: pain. MS:  +joint pain or swelling.   Neuro-     nothing unusualk pain. Psych:  No- change in mood or affect. No depression or anxiety.  No memory loss.   Objective:   Physical Exam              General- Alert, Oriented, Affect-appropriate, Distress+ tearful/ crying  + overweight,  Skin- rash-none, lesions- none, excoriation- none Lymphadenopathy- none Head- atraumatic            Eyes- Gross vision intact, PERRLA, conjunctivae clear secretions. Chronic periorbital edema.  Ears- Hearing, canals-normal            Nose- Clear, no-Septal dev, mucus, polyps, erosion, perforation             Throat- Mallampati II-III , mucosa clear , drainage- none, tonsils- atrophic,  Neck- flexible , trachea midline, no stridor , thyroid nl, carotid no bruit Chest - symmetrical excursion , unlabored           Heart/CV- RRR , no murmur , no gallop  , no rub, nl s1 s2                           - JVD- none , edema- none, stasis changes- none, varices- none            Lung- clear, unlabored, dullness-none, rub- none,  Cough- none           Chest wall-  Abd-  Br/ Gen/ Rectal- Not done, not indicated Extrem- cyanosis- none, clubbing, none, atrophy- none, strength- nl Neuro- grossly intact to observation

## 2021-04-13 ENCOUNTER — Other Ambulatory Visit: Payer: Self-pay

## 2021-04-13 ENCOUNTER — Ambulatory Visit (INDEPENDENT_AMBULATORY_CARE_PROVIDER_SITE_OTHER): Payer: Medicare Other

## 2021-04-13 ENCOUNTER — Ambulatory Visit (INDEPENDENT_AMBULATORY_CARE_PROVIDER_SITE_OTHER): Payer: Medicare Other | Admitting: Internal Medicine

## 2021-04-13 ENCOUNTER — Encounter: Payer: Self-pay | Admitting: Internal Medicine

## 2021-04-13 VITALS — BP 138/72 | HR 54 | Temp 98.3°F | Resp 16

## 2021-04-13 DIAGNOSIS — I6521 Occlusion and stenosis of right carotid artery: Secondary | ICD-10-CM

## 2021-04-13 DIAGNOSIS — K219 Gastro-esophageal reflux disease without esophagitis: Secondary | ICD-10-CM

## 2021-04-13 DIAGNOSIS — J4540 Moderate persistent asthma, uncomplicated: Secondary | ICD-10-CM

## 2021-04-13 MED ORDER — OMALIZUMAB 150 MG/ML ~~LOC~~ SOSY
150.0000 mg | PREFILLED_SYRINGE | Freq: Once | SUBCUTANEOUS | Status: AC
  Administered 2021-04-13: 150 mg via SUBCUTANEOUS
  Filled 2021-04-13: qty 1

## 2021-04-13 NOTE — Progress Notes (Signed)
Diagnosis: Asthma  Provider:  Praveen Mannam, MD  Procedure: Injection  Xolair (Omalizumab), Dose: 150 mg, Site: subcutaneous  Discharge: Condition: Good, Destination: Home . AVS provided to patient.   Performed by:  Deavin Forst E, LPN        

## 2021-04-13 NOTE — Patient Instructions (Addendum)
We can continue current meds - please call for refills as needed

## 2021-04-13 NOTE — Assessment & Plan Note (Signed)
Now on double therapy for control Reminded to continue reflux precautions

## 2021-04-13 NOTE — Assessment & Plan Note (Signed)
Continues good control thanks to Xolair. Plan- continue current meds

## 2021-05-03 ENCOUNTER — Telehealth: Payer: Self-pay | Admitting: Cardiology

## 2021-05-03 NOTE — Telephone Encounter (Addendum)
   Patient Name: Debra Byrd  DOB: 23-Apr-1946 MRN: [id number]  Primary Cardiologist: Norman Herrlich, MD  Chart reviewed as part of pre-operative protocol coverage.   Pt takes plavix for carotid artery stenosis. Per Dr. Dulce Sellar, OK to hold plavix for 5 days before and 2 days after dental procedure.   Dr. Dulce Sellar has instructed her to use SBE prophylaxis for mitral regurgitation.  I will route this recommendation to the requesting party via Epic fax function and remove from pre-op pool.  Please call with questions.  Roe Rutherford Etan Vasudevan, PA 05/03/2021, 4:19 PM

## 2021-05-03 NOTE — Telephone Encounter (Signed)
   [facility] Pre-operative Risk Assessment    Patient Name: Debra Byrd  DOB: [birth year] MRN: [id number]  HEARTCARE STAFF:  - IMPORTANT!!!!!! Under Visit Info/Reason for Call, type in Other and utilize the format Clearance MM/DD/YY or Clearance TBD. Do not use dashes or single digits. - Please review there is not already an duplicate clearance open for this procedure. - If request is for dental extraction, please clarify the # of teeth to be extracted. - If the patient is currently at the dentist's office, call Pre-Op Callback Staff (MA/nurse) to input urgent request.  - If the patient is not currently in the dentist office, please route to the Pre-Op pool.  Request for surgical clearance:  What type of surgery is being performed? 1 tooth extraction and bone graph   When is this surgery scheduled? 05/18/21  What type of clearance is required (medical clearance vs. Pharmacy clearance to hold med vs. Both)? Medical   Are there any medications that need to be held prior to surgery and how long? Blood thinners TBD  Practice name and name of physician performing surgery? [facility], Dr. Guy Sandifer   What is the office phone number? [phone]   7.   What is the office fax number? [phone]  8.   Anesthesia type (None, local, MAC, general) ? Local with numbing    Trilby Drummer 05/03/2021, 10:48 AM  _________________________________________________________________   (provider comments below)

## 2021-05-11 ENCOUNTER — Other Ambulatory Visit: Payer: Self-pay

## 2021-05-11 ENCOUNTER — Ambulatory Visit (INDEPENDENT_AMBULATORY_CARE_PROVIDER_SITE_OTHER): Payer: Medicare Other

## 2021-05-11 VITALS — BP 136/82 | HR 55 | Temp 98.4°F | Resp 18 | Ht 61.0 in | Wt 160.0 lb

## 2021-05-11 DIAGNOSIS — J4540 Moderate persistent asthma, uncomplicated: Secondary | ICD-10-CM | POA: Diagnosis not present

## 2021-05-11 MED ORDER — OMALIZUMAB 150 MG/ML ~~LOC~~ SOSY
150.0000 mg | PREFILLED_SYRINGE | Freq: Once | SUBCUTANEOUS | Status: AC
  Administered 2021-05-11: 150 mg via SUBCUTANEOUS
  Filled 2021-05-11: qty 1

## 2021-05-11 NOTE — Progress Notes (Signed)
Diagnosis: Asthma  Provider:  Chilton Greathouse, MD  Procedure: Injection  Xolair (Omalizumab), Dose: 150 mg, Site: subcutaneous right hip   Discharge: Condition: Good, Destination: Home . AVS provided to patient.   Performed by:  Willman Cuny, Lyman Speller, LPN

## 2021-05-27 ENCOUNTER — Other Ambulatory Visit: Payer: Self-pay

## 2021-05-27 ENCOUNTER — Ambulatory Visit (INDEPENDENT_AMBULATORY_CARE_PROVIDER_SITE_OTHER): Payer: Medicare Other

## 2021-05-27 DIAGNOSIS — Z23 Encounter for immunization: Secondary | ICD-10-CM | POA: Diagnosis not present

## 2021-05-27 NOTE — Progress Notes (Signed)
Pt came in to receive her high dose flu shot. Flu shot administered. Pt tolerated well.

## 2021-06-03 ENCOUNTER — Ambulatory Visit: Payer: Medicare Other | Admitting: Podiatry

## 2021-06-08 ENCOUNTER — Ambulatory Visit (INDEPENDENT_AMBULATORY_CARE_PROVIDER_SITE_OTHER): Payer: Medicare Other | Admitting: *Deleted

## 2021-06-08 ENCOUNTER — Other Ambulatory Visit: Payer: Self-pay

## 2021-06-08 VITALS — BP 129/79 | HR 50 | Temp 97.6°F | Resp 16 | Ht 61.0 in | Wt 162.8 lb

## 2021-06-08 DIAGNOSIS — J4540 Moderate persistent asthma, uncomplicated: Secondary | ICD-10-CM

## 2021-06-08 MED ORDER — DIPHENHYDRAMINE HCL 50 MG/ML IJ SOLN: 50.0000 mg | Freq: Once | INTRAMUSCULAR | Status: DC | PRN

## 2021-06-08 MED ORDER — ALBUTEROL SULFATE HFA 108 (90 BASE) MCG/ACT IN AERS: 2.0000 | INHALATION_SPRAY | Freq: Once | RESPIRATORY_TRACT | Status: DC | PRN

## 2021-06-08 MED ORDER — METHYLPREDNISOLONE SODIUM SUCC 125 MG IJ SOLR: 125.0000 mg | Freq: Once | INTRAMUSCULAR | Status: DC | PRN

## 2021-06-08 MED ORDER — EPINEPHRINE 0.3 MG/0.3ML IJ SOAJ: 0.3000 mg | Freq: Once | INTRAMUSCULAR | Status: DC | PRN

## 2021-06-08 MED ORDER — FAMOTIDINE IN NACL 20-0.9 MG/50ML-% IV SOLN: 20.0000 mg | Freq: Once | INTRAVENOUS | Status: DC | PRN

## 2021-06-08 MED ORDER — SODIUM CHLORIDE 0.9 % IV SOLN: Freq: Once | INTRAVENOUS | Status: DC | PRN

## 2021-06-08 MED ORDER — OMALIZUMAB 150 MG/ML ~~LOC~~ SOSY
150.0000 mg | PREFILLED_SYRINGE | Freq: Once | SUBCUTANEOUS | Status: AC
  Administered 2021-06-08: 150 mg via SUBCUTANEOUS

## 2021-06-08 NOTE — Progress Notes (Signed)
Diagnosis: Asthma  Provider:  Praveen Mannam, MD  Procedure: Injection  Xolair (Omalizumab), Dose: 150 mg, Site: subcutaneous  Discharge: Condition: Good, Destination: Home . AVS provided to patient.   Performed by:  Mazel Villela A, RN        

## 2021-06-10 ENCOUNTER — Ambulatory Visit (INDEPENDENT_AMBULATORY_CARE_PROVIDER_SITE_OTHER): Payer: Medicare Other | Admitting: Podiatry

## 2021-06-10 ENCOUNTER — Other Ambulatory Visit: Payer: Self-pay

## 2021-06-10 DIAGNOSIS — B351 Tinea unguium: Secondary | ICD-10-CM

## 2021-06-10 DIAGNOSIS — D689 Coagulation defect, unspecified: Secondary | ICD-10-CM | POA: Diagnosis not present

## 2021-06-10 DIAGNOSIS — Q828 Other specified congenital malformations of skin: Secondary | ICD-10-CM

## 2021-06-17 NOTE — Progress Notes (Signed)
  Subjective:  Patient ID: Debra Byrd, female    DOB: [birth year],  MRN: [id number]  Chief Complaint  Patient presents with   Callouses    Trim calluses    Nail Problem    Trim nails    75 y.o. female presents with the above complaint. History confirmed with patient. Hx as above.  Objective:  Physical Exam: warm, good capillary refill, nail exam onychomycosis of the toenails, no trophic changes or ulcerative lesions, normal DP and PT pulses, and normal sensory exam. Punctate hyperkeratosis submet 2 bilat, HPK medial 1st MPJ bilat   No images are attached to the encounter.  Assessment:   1. Onychomycosis   2. Coagulation defect (HCC)   3. Porokeratosis    Plan:  Patient was evaluated and treated and all questions answered.  Onychomycosis and Coagulation Defect, Porokeratosis -Nails palliatively debrided secondary to pain  Procedure: Nail Debridement Type of Debridement: manual, sharp debridement. Instrumentation: Nail nipper, rotary burr. Number of Nails: 10   Procedure: Paring of Lesion Rationale: painful hyperkeratotic lesion Type of Debridement: manual, sharp debridement. Instrumentation: 312 blade Number of Lesions: 4

## 2021-06-24 ENCOUNTER — Telehealth: Payer: Self-pay | Admitting: Pharmacy Technician

## 2021-06-24 NOTE — Telephone Encounter (Addendum)
Dr. Maple Hudson,  Insurance has denied Xolair due to medical necessity that the patient does not have a physical or mental disability that prevents them from self injecting.   For appeals reasons please provide a statement in chart notes the reason why patient can not self inject. Patient has peripheral neuropathy and glaucoma and does not have home support to self inject.  Will f/u with response. Will fax updated chart notes to Provider Courtesy Review  Fax: [phone]

## 2021-06-26 IMAGING — MR MR MRA HEAD W/O CM
1 series · 19 of 48 positions shown · IV contrast (gadavist)
Comparison: None.

CLINICAL DATA: Carotid stenosis.

EXAM:
MRA NECK WITHOUT AND WITH CONTRAST
MRA HEAD WITHOUT CONTRAST
TECHNIQUE: Multiplanar and multiecho pulse sequences of the neck were obtained
without and with intravenous contrast. Angiographic images of the
neck were obtained using MRA technique without and with intravenous
contrast.; Angiographic images of the Circle of Willis were obtained
using MRA technique without intravenous contrast.
CONTRAST:  7.5mL GADAVIST GADOBUTROL 1 MMOL/ML IV SOLN

[Series 4: tof_3d_multi-slab · axial · 0.5mm · 0.35mm/px · z∈[-91,-0]mm · 19 of 191 slices shown]
[im 1/191]
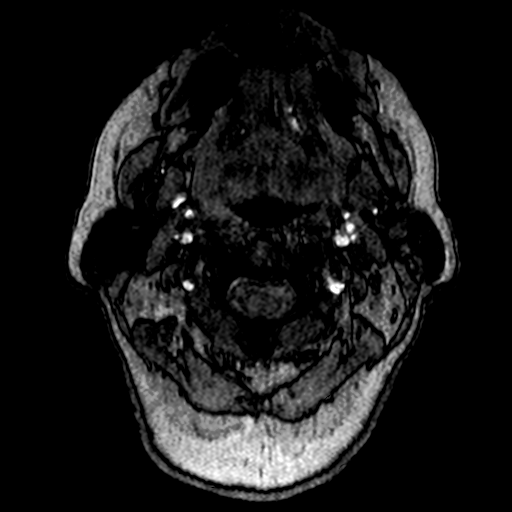
[im 5/191]
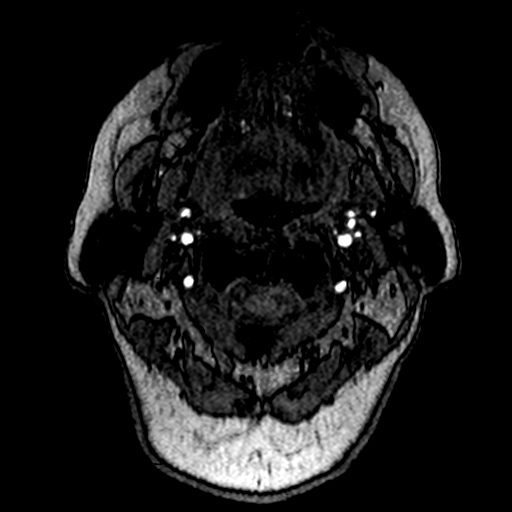
[im 9/191]
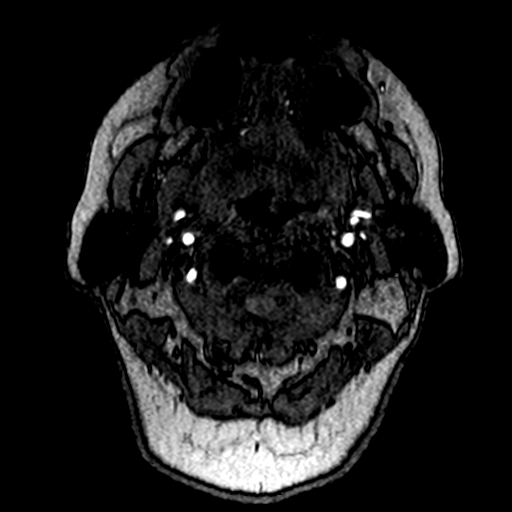
[im 13/191]
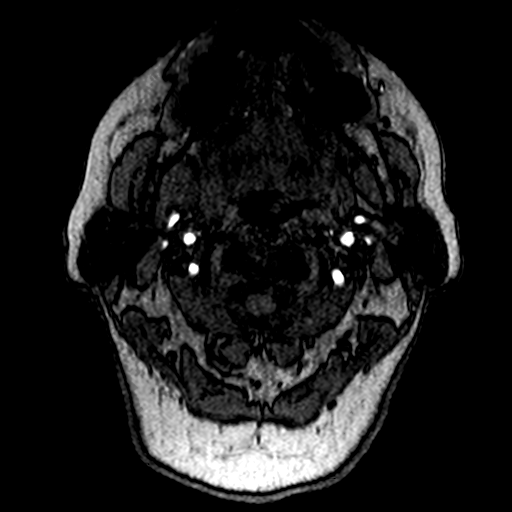
[im 17/191]
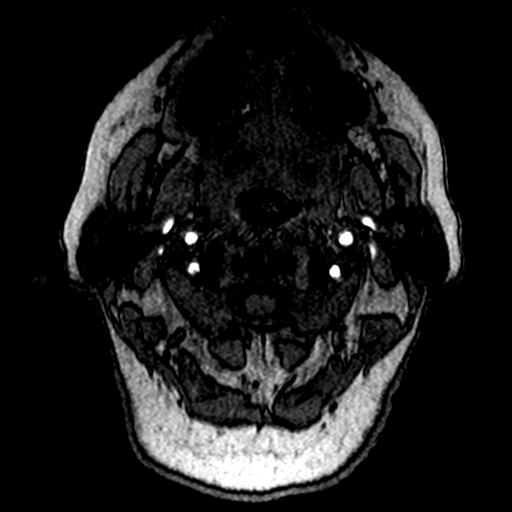
[im 21/191]
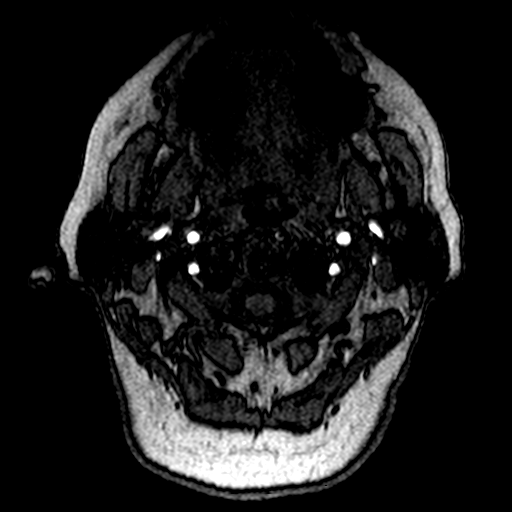
[im 25/191]
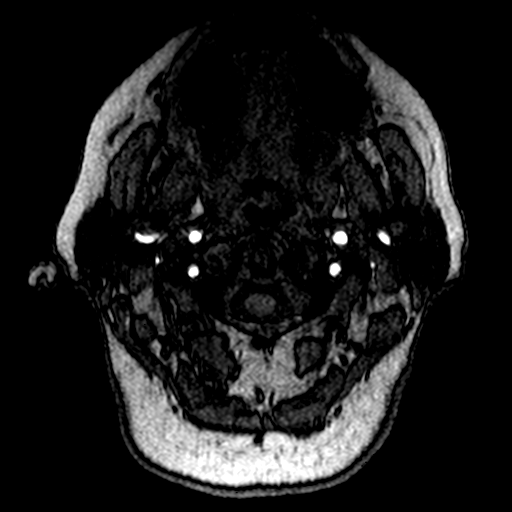
[im 29/191]
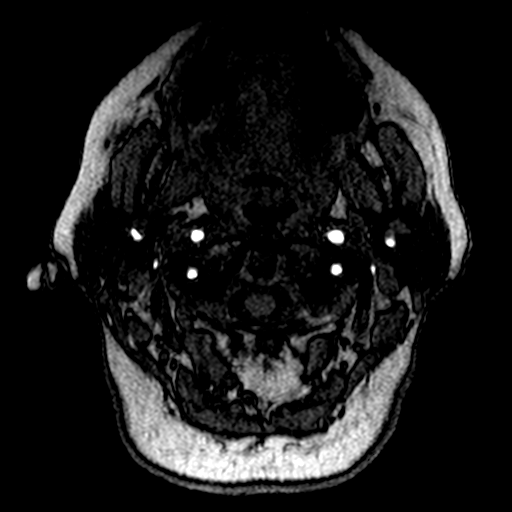
[im 33/191]
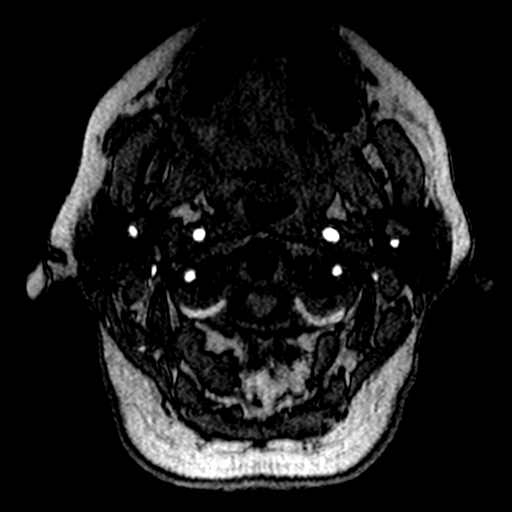
[im 37/191]
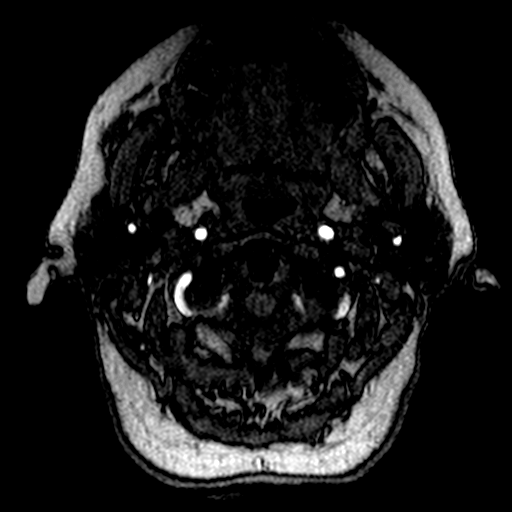
[im 41/191]
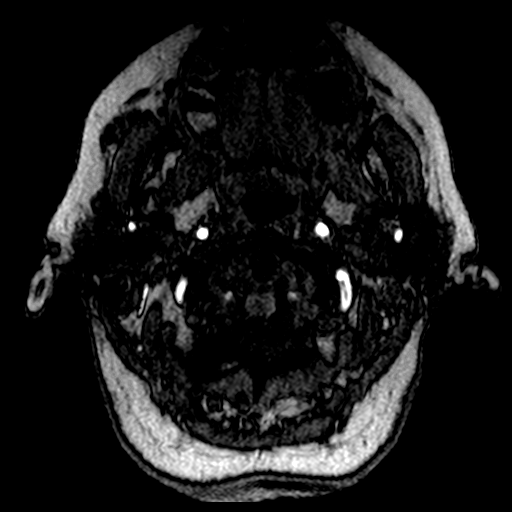
[im 61/191]
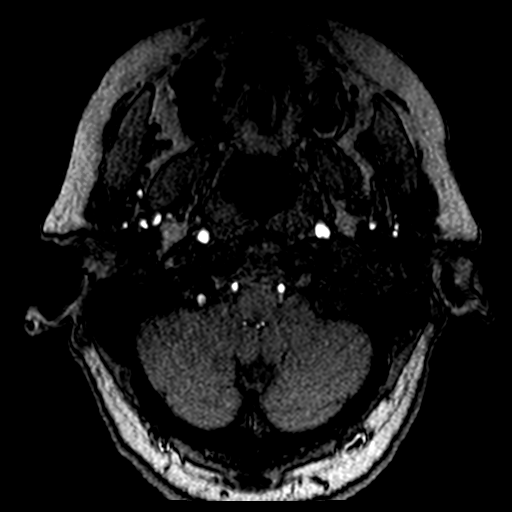
[im 85/191]
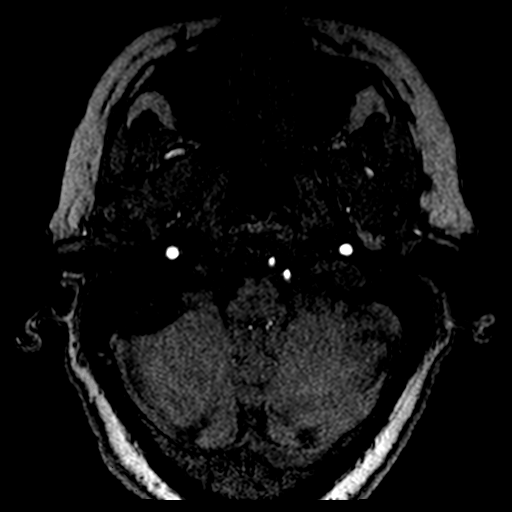
[im 98/191]
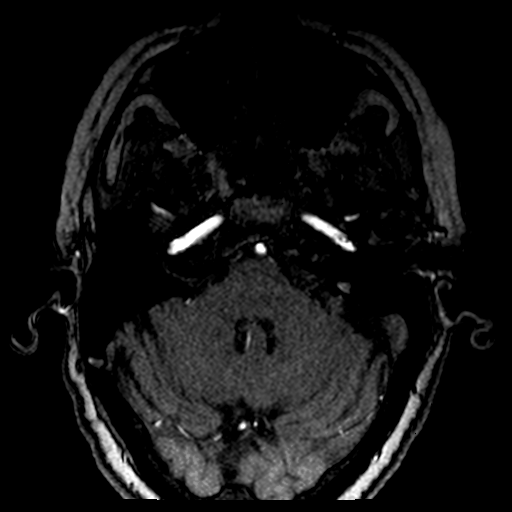
[im 110/191]
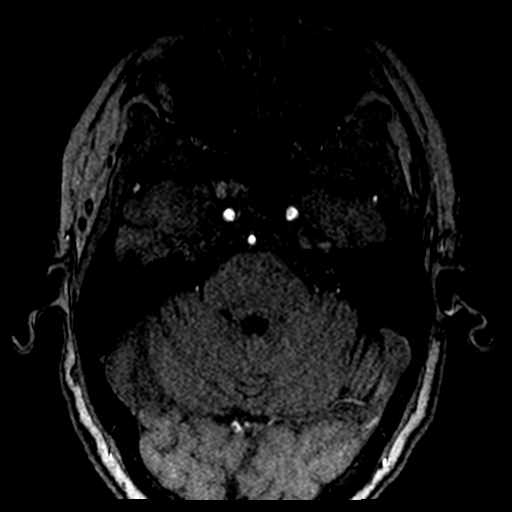
[im 134/191]
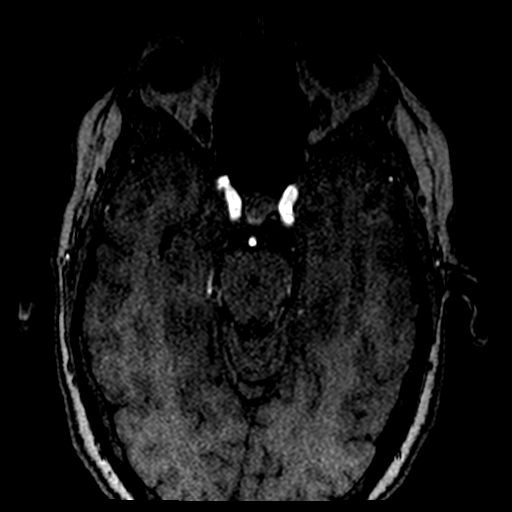
[im 158/191]
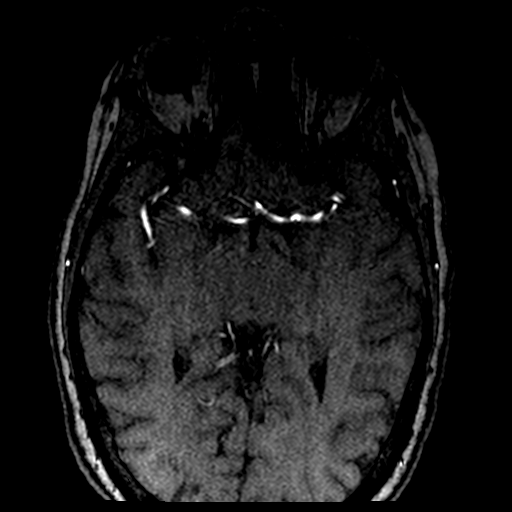
[im 162/191]
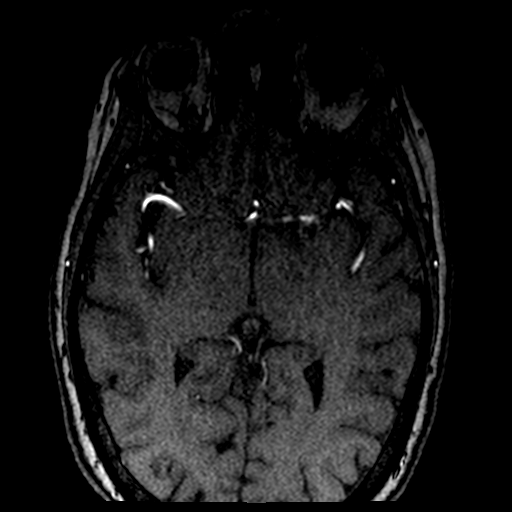
[im 182/191]
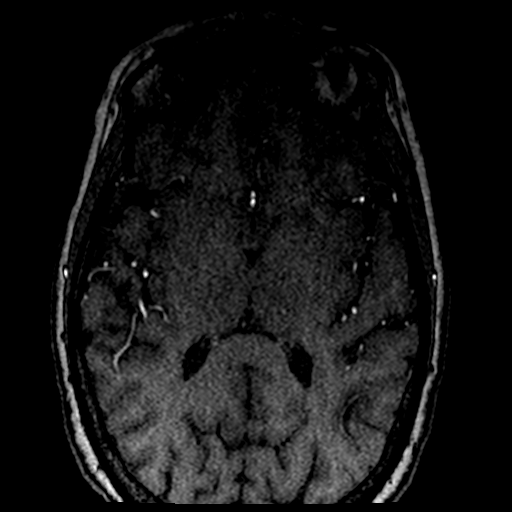

[19 of 48 positions shown; findings below may reference images not displayed]

FINDINGS: MRA NECK FINDINGS

There is less than 50% narrowing of the proximal right internal
carotid artery. Otherwise, MRA of the neck is normal.

MRA HEAD FINDINGS

POSTERIOR CIRCULATION:

--Vertebral arteries: Normal V4 segments.

--Inferior cerebellar arteries: Normal.

--Basilar artery: Normal.

--Superior cerebellar arteries: Normal.

--Posterior cerebral arteries: Normal.

ANTERIOR CIRCULATION:

--Intracranial internal carotid arteries: Normal.

--Anterior cerebral arteries (ACA): Normal. Both A1 segments are
present. Patent anterior communicating artery (a-comm).

--Middle cerebral arteries (MCA): Normal.
IMPRESSION: Normal MRA of the head and neck.

## 2021-06-26 IMAGING — MR MR MRA NECK WO/W CM
4 of 8 series · 32 of 48 positions shown · IV contrast (7.5 ML GADAVIST)
Comparison: None.

CLINICAL DATA: Carotid stenosis.

EXAM:
MRA NECK WITHOUT AND WITH CONTRAST
MRA HEAD WITHOUT CONTRAST
TECHNIQUE: Multiplanar and multiecho pulse sequences of the neck were obtained
without and with intravenous contrast. Angiographic images of the
neck were obtained using MRA technique without and with intravenous
contrast.; Angiographic images of the Circle of Willis were obtained
using MRA technique without intravenous contrast.
CONTRAST:  7.5mL GADAVIST GADOBUTROL 1 MMOL/ML IV SOLN

[Series 7: fl_tof_2d · axial · 3.0mm · 0.49mm/px · z∈[-243,-74]mm · 9 of 85 slices shown]
[im 1/85]
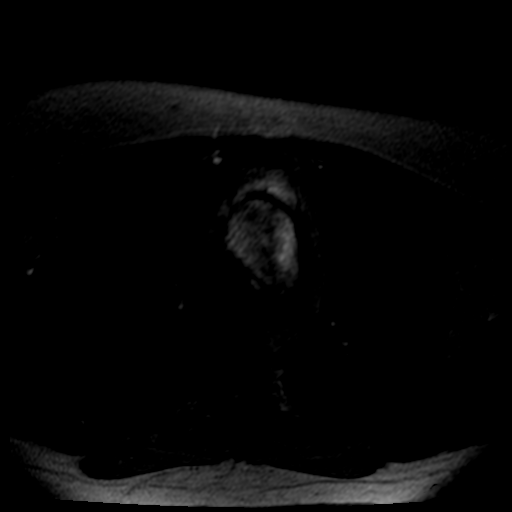
[im 11/85]
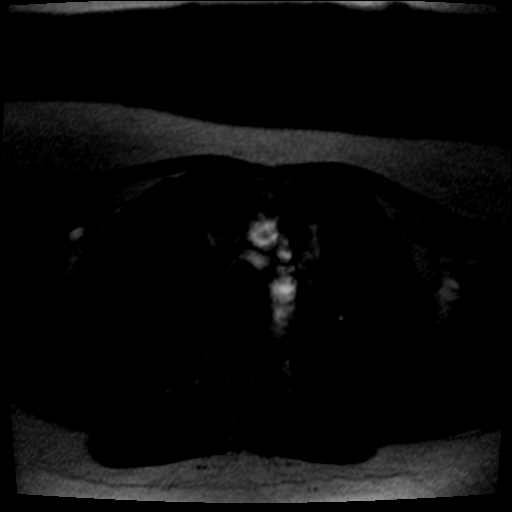
[im 22/85]
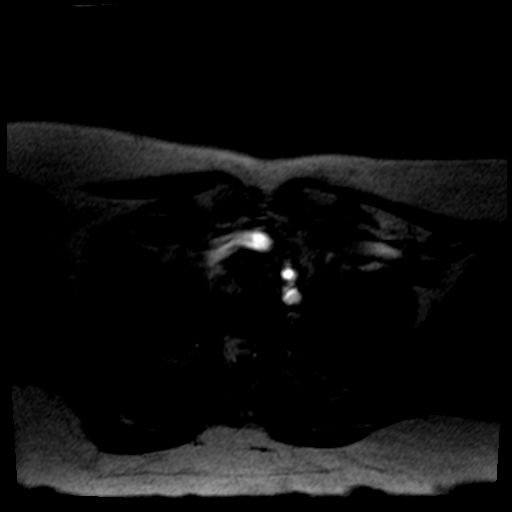
[im 32/85]
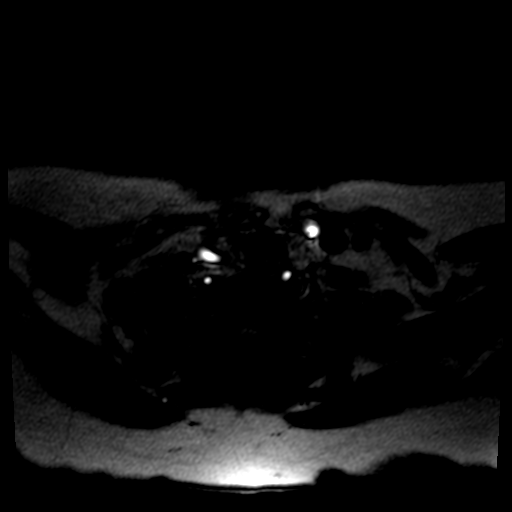
[im 43/85]
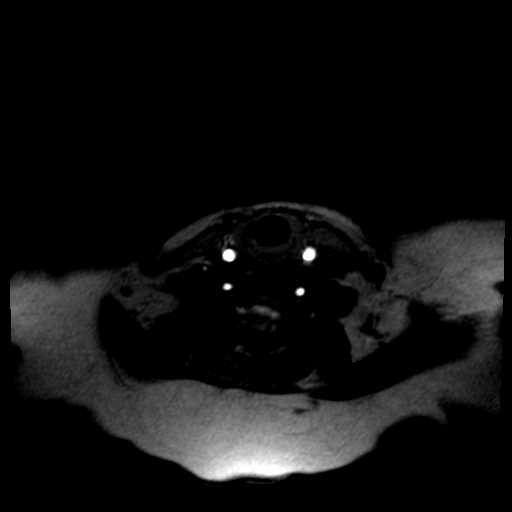
[im 53/85]
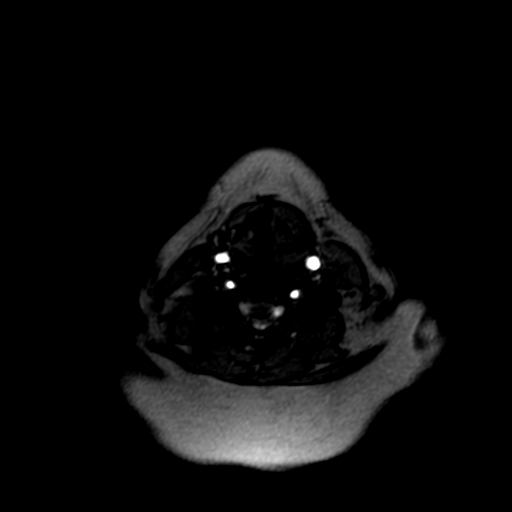
[im 64/85]
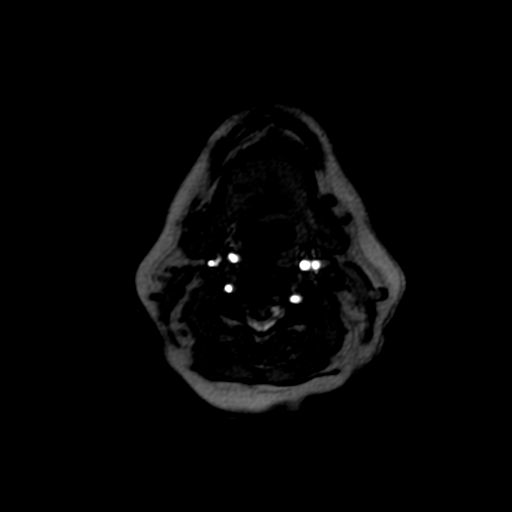
[im 74/85]
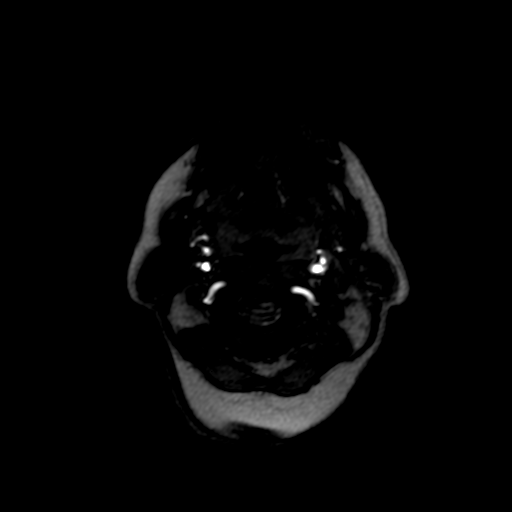
[im 85/85]
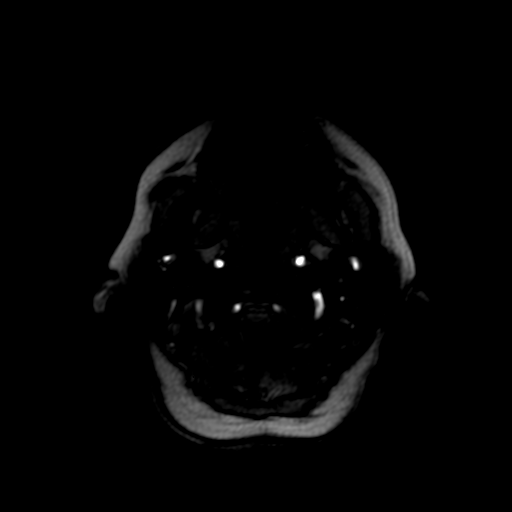

[Series 11: (id)_tt=1.0s · coronal · 0.8mm · 0.78mm/px · 11 of 88 slices shown (1 of 2)]
[im 1/88]
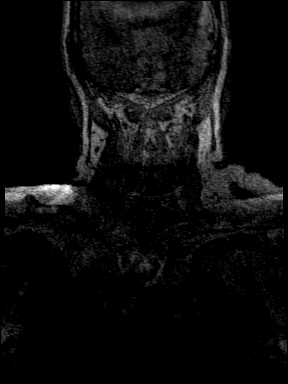
[im 9/88]
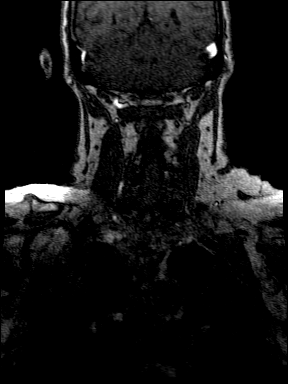
[im 18/88]
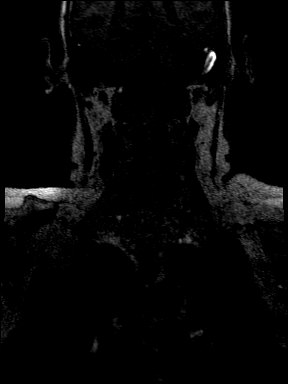
[im 27/88]
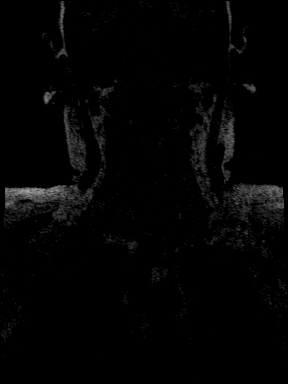
[im 35/88]
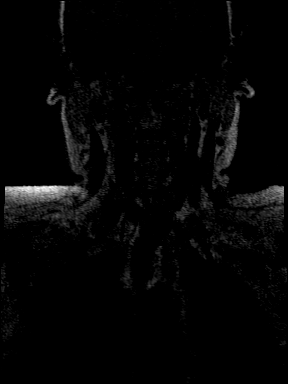
[im 44/88]
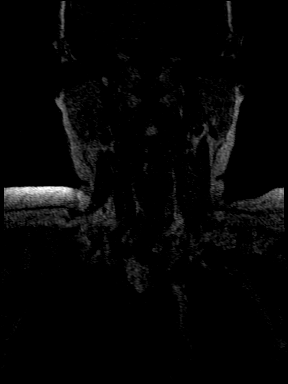
[im 53/88]
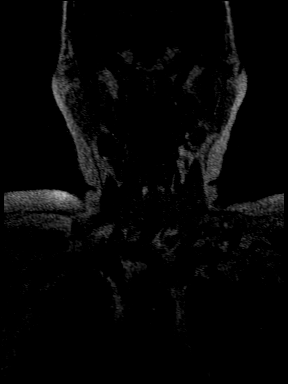
[im 61/88]
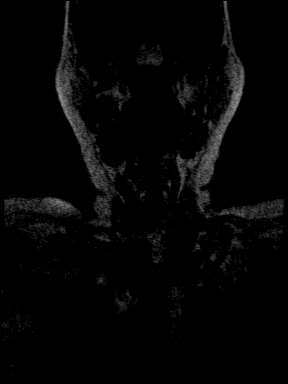
[im 70/88]
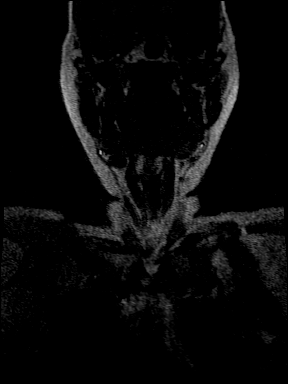
[im 79/88]
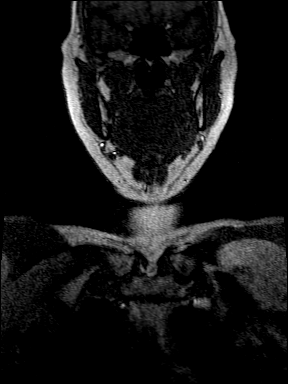
[im 88/88]
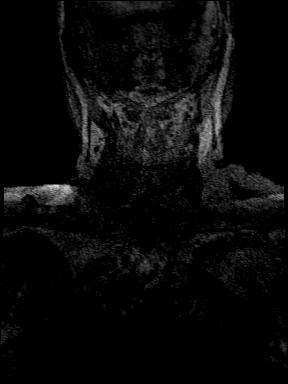

[Series 13: (id)_tt=1.0s · coronal · 0.8mm · 0.78mm/px · 9 of 88 slices shown (2 of 2)]
[im 1/88]
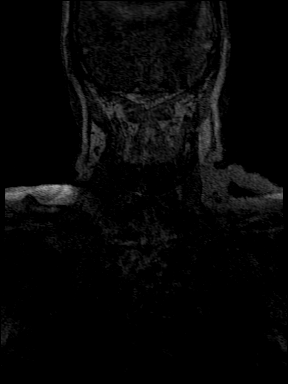
[im 9/88]
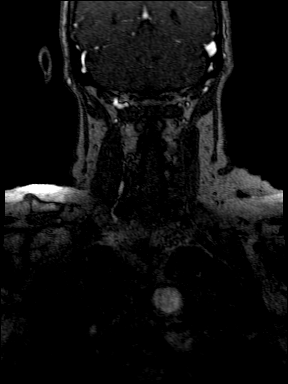
[im 18/88]
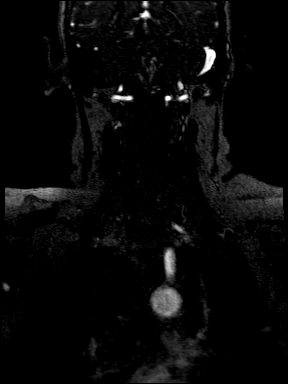
[im 27/88]
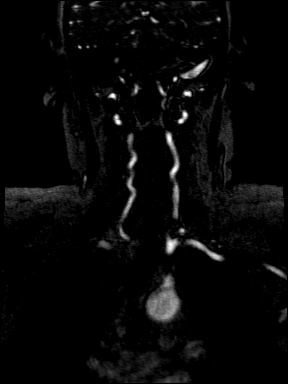
[im 35/88]
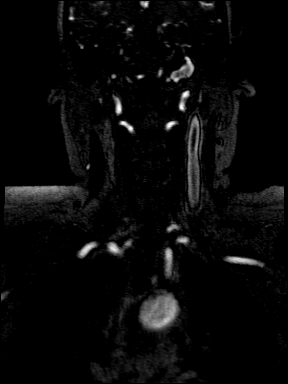
[im 44/88]
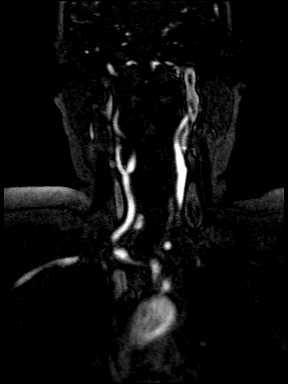
[im 53/88]
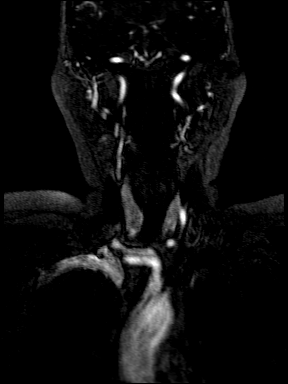
[im 61/88]
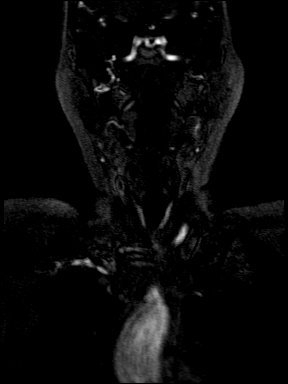
[im 79/88]
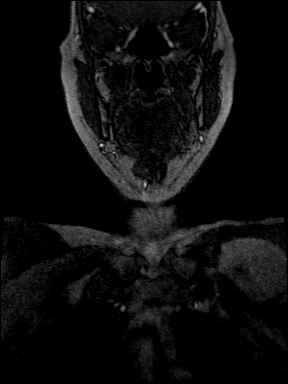

[Series 14: (id)_tt=1.0s_sub · coronal · 0.8mm · 0.78mm/px · 3 of 81 slices shown]
[im 9/81]
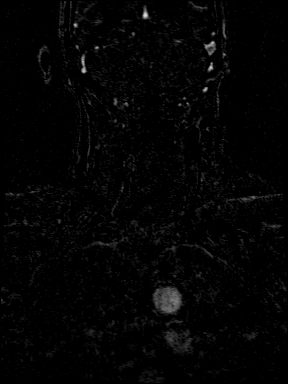
[im 45/81]
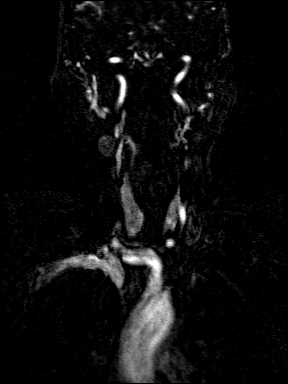
[im 72/81]
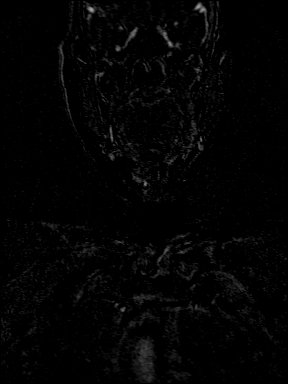

[32 of 48 positions shown; findings below may reference images not displayed]

FINDINGS: MRA NECK FINDINGS

There is less than 50% narrowing of the proximal right internal
carotid artery. Otherwise, MRA of the neck is normal.

MRA HEAD FINDINGS

POSTERIOR CIRCULATION:

--Vertebral arteries: Normal V4 segments.

--Inferior cerebellar arteries: Normal.

--Basilar artery: Normal.

--Superior cerebellar arteries: Normal.

--Posterior cerebral arteries: Normal.

ANTERIOR CIRCULATION:

--Intracranial internal carotid arteries: Normal.

--Anterior cerebral arteries (ACA): Normal. Both A1 segments are
present. Patent anterior communicating artery (a-comm).

--Middle cerebral arteries (MCA): Normal.
IMPRESSION: Normal MRA of the head and neck.

## 2021-06-29 ENCOUNTER — Telehealth: Payer: Medicare Other | Admitting: Pharmacist

## 2021-06-29 NOTE — Telephone Encounter (Signed)
Currently receiving Xolair at [facility] infusion center but her secondary plan [employer] is denying coverage. Will need to verify with patient that she is unable to self-administer and does not have anyone else to administer to be able to fill appeal on her behalf  Chesley Mires, PharmD, MPH, BCPS Clinical Pharmacist (Rheumatology and Pulmonology)

## 2021-06-30 NOTE — Telephone Encounter (Signed)
Spoke with patient who states that she is unable to self-administer because of her vision. She states the only lives with her husband and does not trust him to administer the medication due to his own limited vision. She is fearful that he would lose dexterity and have needlestick injury similar to herself  Advised that we will work to appeal through [facility] for her to continue to receive at infusion center through medical benefit.  In case appeals are denied ultimately, we will have to pursue [facility] patient assistance and she will have to receive at infusion center with admin fees billed through medical. Will place patient form for [employer] PAP with Selena Batten C at infusion center to be completed at next Xolair injection on 07/06/21. Will place provider portion in Dr. Roxy Cedar mail box to be completed  Chesley Mires, PharmD, MPH, BCPS Clinical Pharmacist (Rheumatology and Pulmonology)

## 2021-06-30 NOTE — Telephone Encounter (Signed)
8/23 office note addended to include requested information.

## 2021-07-05 NOTE — Telephone Encounter (Signed)
F/u note: Provider Courtsey Review still pending for [employer]

## 2021-07-06 ENCOUNTER — Other Ambulatory Visit: Payer: Self-pay

## 2021-07-06 ENCOUNTER — Ambulatory Visit (INDEPENDENT_AMBULATORY_CARE_PROVIDER_SITE_OTHER): Payer: Medicare Other

## 2021-07-06 VITALS — BP 126/79 | HR 60 | Temp 97.8°F | Resp 16 | Wt 161.8 lb

## 2021-07-06 DIAGNOSIS — J4540 Moderate persistent asthma, uncomplicated: Secondary | ICD-10-CM | POA: Diagnosis not present

## 2021-07-06 MED ORDER — DIPHENHYDRAMINE HCL 50 MG/ML IJ SOLN: 50.0000 mg | Freq: Once | INTRAMUSCULAR | Status: DC | PRN

## 2021-07-06 MED ORDER — ALBUTEROL SULFATE HFA 108 (90 BASE) MCG/ACT IN AERS: 2.0000 | INHALATION_SPRAY | Freq: Once | RESPIRATORY_TRACT | Status: DC | PRN

## 2021-07-06 MED ORDER — SODIUM CHLORIDE 0.9 % IV SOLN: Freq: Once | INTRAVENOUS | Status: DC | PRN

## 2021-07-06 MED ORDER — FAMOTIDINE IN NACL 20-0.9 MG/50ML-% IV SOLN: 20.0000 mg | Freq: Once | INTRAVENOUS | Status: DC | PRN

## 2021-07-06 MED ORDER — EPINEPHRINE 0.3 MG/0.3ML IJ SOAJ: 0.3000 mg | Freq: Once | INTRAMUSCULAR | Status: DC | PRN

## 2021-07-06 MED ORDER — OMALIZUMAB 150 MG/ML ~~LOC~~ SOSY
150.0000 mg | PREFILLED_SYRINGE | Freq: Once | SUBCUTANEOUS | Status: AC
  Administered 2021-07-06: 150 mg via SUBCUTANEOUS

## 2021-07-06 MED ORDER — METHYLPREDNISOLONE SODIUM SUCC 125 MG IJ SOLR: 125.0000 mg | Freq: Once | INTRAMUSCULAR | Status: DC | PRN

## 2021-07-06 NOTE — Telephone Encounter (Signed)
Provider portion received and placed in "Awaiting response" folder

## 2021-07-06 NOTE — Progress Notes (Signed)
Diagnosis: Asthma  Provider:  Praveen Mannam, MD  Procedure: Injection  Xolair (Omalizumab), Dose: 150 mg, Site: subcutaneous  Discharge: Condition: Good, Destination: Home . AVS provided to patient.   Performed by:  Javarian Jakubiak, RN        

## 2021-07-07 NOTE — Telephone Encounter (Signed)
Submitted provider portion of Patient Assistance Application to [place] for [place]. Patient portion already submitted. Will update patient when we receive a response.  Fax# [phone] Phone# [phone]  Chesley Mires, PharmD, MPH, BCPS Clinical Pharmacist (Rheumatology and Pulmonology)

## 2021-07-07 NOTE — Telephone Encounter (Signed)
F/u Provider Courtsey Review has been denied.  Patient has been enrolled in [facility] for Xolair. Will f/u with status.

## 2021-07-07 NOTE — Telephone Encounter (Signed)
Patient has signed [employer] forms and forms have been faxed [phone]. Explained the process of [employer], patient understood and had no further questions.

## 2021-07-13 ENCOUNTER — Ambulatory Visit: Payer: Medicare Other

## 2021-07-13 NOTE — Telephone Encounter (Signed)
Per [employer] Rep, application still missing copies of insurance cards. Cards uploaded to portal, will continue to f/u.

## 2021-07-19 NOTE — Telephone Encounter (Signed)
F/U phone call to [facility]: [phone] Insurance cards have been received.   Still pending.

## 2021-07-23 NOTE — Telephone Encounter (Signed)
Routing to [employer] to f/u with scheduling medication shipment from [facility].

## 2021-07-23 NOTE — Telephone Encounter (Signed)
Received a fax from [place] regarding an approval for Xolair patient assistance from 07/23/2021 until patient no longer qualifies due to discontinuation of therapy, changes to patient's current financial status or health insurance and/or patient no longer meets the program eligibility requirements. Called pt and provided update and next steps, phone numbers have been provided. Encouraged pt to reach out if she has any additional questions or concerns. Pt verbalized understanding to all. Relevant Documents have been sent to scan center. Nothing further required at this time.  [employer] Phone#: [phone] option 5 [facility] Phone#: [phone]

## 2021-07-23 NOTE — Telephone Encounter (Signed)
Called [place] for update on patient's Xolair PAP application. Confirmed that patient does not have Medicaid.  Per rep, will receive decision letter in next 1-2 business days  Phone: [phone]  Chesley Mires, PharmD, MPH, BCPS Clinical Pharmacist (Rheumatology and Pulmonology)

## 2021-07-26 NOTE — Telephone Encounter (Signed)
Xolair has been ordered and set for delivery to [facility] on 08/03/21.

## 2021-08-03 ENCOUNTER — Other Ambulatory Visit: Payer: Self-pay

## 2021-08-03 ENCOUNTER — Ambulatory Visit (INDEPENDENT_AMBULATORY_CARE_PROVIDER_SITE_OTHER): Payer: Medicare Other

## 2021-08-03 VITALS — BP 123/77 | HR 54 | Temp 98.1°F | Resp 16 | Ht 61.0 in | Wt 161.4 lb

## 2021-08-03 DIAGNOSIS — J4540 Moderate persistent asthma, uncomplicated: Secondary | ICD-10-CM | POA: Diagnosis not present

## 2021-08-03 MED ORDER — OMALIZUMAB 150 MG/ML ~~LOC~~ SOSY
150.0000 mg | PREFILLED_SYRINGE | Freq: Once | SUBCUTANEOUS | Status: AC
  Administered 2021-08-03: 150 mg via SUBCUTANEOUS
  Filled 2021-08-03: qty 1

## 2021-08-03 MED ORDER — ALBUTEROL SULFATE HFA 108 (90 BASE) MCG/ACT IN AERS: 2.0000 | INHALATION_SPRAY | Freq: Once | RESPIRATORY_TRACT | Status: DC | PRN

## 2021-08-03 MED ORDER — EPINEPHRINE 0.3 MG/0.3ML IJ SOAJ: 0.3000 mg | Freq: Once | INTRAMUSCULAR | Status: DC | PRN

## 2021-08-03 MED ORDER — DIPHENHYDRAMINE HCL 50 MG/ML IJ SOLN: 50.0000 mg | Freq: Once | INTRAMUSCULAR | Status: DC | PRN

## 2021-08-03 MED ORDER — FAMOTIDINE IN NACL 20-0.9 MG/50ML-% IV SOLN: 20.0000 mg | Freq: Once | INTRAVENOUS | Status: DC | PRN

## 2021-08-03 MED ORDER — METHYLPREDNISOLONE SODIUM SUCC 125 MG IJ SOLR: 125.0000 mg | Freq: Once | INTRAMUSCULAR | Status: DC | PRN

## 2021-08-03 MED ORDER — SODIUM CHLORIDE 0.9 % IV SOLN: Freq: Once | INTRAVENOUS | Status: DC | PRN

## 2021-08-03 NOTE — Progress Notes (Signed)
Diagnosis: Asthma ° °Provider:  Praveen Mannam, MD ° °Procedure: Injection ° °Xolair (Omalizumab), Dose: 150 mg, Site: subcutaneous, Number of injections: 1 ° °Discharge: Condition: Good, Destination: Home . AVS provided to patient.  ° °Performed by:  Thaddus Mcdowell E, LPN  ° ° ° °  °

## 2021-08-10 ENCOUNTER — Ambulatory Visit: Payer: Medicare Other

## 2021-08-31 ENCOUNTER — Other Ambulatory Visit: Payer: Self-pay

## 2021-08-31 ENCOUNTER — Ambulatory Visit (INDEPENDENT_AMBULATORY_CARE_PROVIDER_SITE_OTHER): Payer: Medicare Other

## 2021-08-31 VITALS — BP 128/78 | HR 59 | Temp 98.1°F | Resp 18 | Ht 61.0 in | Wt 163.0 lb

## 2021-08-31 DIAGNOSIS — J4540 Moderate persistent asthma, uncomplicated: Secondary | ICD-10-CM | POA: Diagnosis not present

## 2021-08-31 MED ORDER — OMALIZUMAB 150 MG/ML ~~LOC~~ SOSY
150.0000 mg | PREFILLED_SYRINGE | Freq: Once | SUBCUTANEOUS | Status: AC
  Administered 2021-08-31: 150 mg via SUBCUTANEOUS
  Filled 2021-08-31: qty 1

## 2021-08-31 NOTE — Progress Notes (Signed)
Diagnosis: Asthma  Provider:  Chilton Greathouse, MD  Procedure: Injection  Xolair (Omalizumab), Dose: 150 mg, Site: subcutaneous, left hip Number of injections: 1  Discharge: Condition: Good, Destination: Home . AVS provided to patient.   Performed by:  Nat Math, RN

## 2021-09-01 ENCOUNTER — Ambulatory Visit (INDEPENDENT_AMBULATORY_CARE_PROVIDER_SITE_OTHER): Payer: Medicare Other | Admitting: Cardiology

## 2021-09-01 ENCOUNTER — Encounter: Payer: Self-pay | Admitting: Cardiology

## 2021-09-01 VITALS — BP 150/80 | HR 58 | Ht 61.0 in | Wt 161.2 lb

## 2021-09-01 DIAGNOSIS — R002 Palpitations: Secondary | ICD-10-CM | POA: Diagnosis not present

## 2021-09-01 DIAGNOSIS — E782 Mixed hyperlipidemia: Secondary | ICD-10-CM

## 2021-09-01 DIAGNOSIS — E876 Hypokalemia: Secondary | ICD-10-CM | POA: Diagnosis not present

## 2021-09-01 DIAGNOSIS — I10 Essential (primary) hypertension: Secondary | ICD-10-CM

## 2021-09-01 DIAGNOSIS — I6521 Occlusion and stenosis of right carotid artery: Secondary | ICD-10-CM

## 2021-09-01 NOTE — Patient Instructions (Signed)
Medication Instructions:  ?Your physician recommends that you continue on your current medications as directed. Please refer to the Current Medication list given to you today. ? ?*If you need a refill on your cardiac medications before your next appointment, please call your pharmacy* ? ? ?Lab Work: ?NONE ?If you have labs (blood work) drawn today and your tests are completely normal, you will receive your results only by: ?MyChart Message (if you have MyChart) OR ?A paper copy in the mail ?If you have any lab test that is abnormal or we need to change your treatment, we will call you to review the results. ? ? ?Testing/Procedures: ?NONE ? ? ?Follow-Up: ?At [facility], you and your health needs are our priority.  As part of our continuing mission to provide you with exceptional heart care, we have created designated Provider Care Teams.  These Care Teams include your primary Cardiologist (physician) and Advanced Practice Providers (APPs -  Physician Assistants and Nurse Practitioners) who all work together to provide you with the care you need, when you need it. ? ?We recommend signing up for the patient portal called "MyChart".  Sign up information is provided on this After Visit Summary.  MyChart is used to connect with patients for Virtual Visits (Telemedicine).  Patients are able to view lab/test results, encounter notes, upcoming appointments, etc.  Non-urgent messages can be sent to your provider as well.   ?To learn more about what you can do with MyChart, go to [email or web address].   ? ?Your next appointment:   ?6 month(s) ? ?The format for your next appointment:   ?In Person ? ?Provider:   ?Brian [name], MD  ? ? ?Other Instructions ?  ?

## 2021-09-01 NOTE — Progress Notes (Signed)
Cardiology Office Note:    Date:  09/01/2021   ID:  Debra Byrd, DOB Dec 05, 1945, MRN [id number]  PCP:  Leane Call, PA-C  Cardiologist:  Norman Herrlich, MD    Referring MD: Leane Call, PA-C    ASSESSMENT:    1. Palpitations   2. Hypokalemia   3. Stenosis of right carotid artery   4. Essential hypertension   5. Mixed hyperlipidemia    PLAN:    In order of problems listed above:  Overall she continues to do well not having symptomatic palpitation and not on beta-blocker or an antiarrhythmic drug. Continue potassium she is pending labs in the next week Asymptomatic continue treatment antiplatelet clopidogrel lipid-lowering high intensity statin and will plan on MRI MRA as August 2-year interval Usually well controlled continue ARB she will trend blood pressure at home and I do not think the recording in the office today is representative Continue her high intensity statin she is pending labs in the next week PCP   Next appointment: 6 months   Medication Adjustments/Labs and Tests Ordered: Current medicines are reviewed at length with the patient today.  Concerns regarding medicines are outlined above.  No orders of the defined types were placed in this encounter.  No orders of the defined types were placed in this encounter.   Chief Complaint  Patient presents with   Follow-up  With APCs mitral regurgitation carotid stenosis  History of Present Illness:    Debra Byrd is a 76 y.o. female with a hx of palpitation with atrial premature contractions hypertension hyperlipidemia and mild right internal carotid artery stenosis and mitral regurgitation mild associated lymphedema therapy.    She saw my partner Dr. Bing Matter 12/02/2020 with chest pain high-sensitivity troponin was normal and subsequent Lexiscan Myoview showed EF 73% and normal perfusion low risk test.  Her last carotid MRA performed 03/28/2020 showed less than 50% narrowing of the right internal  carotid artery which was stable.  She was last seen 01/22/2021. Carotid stenosis atrial premature beats hypertension and hyperlipidemia.  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  Her blood pressure at home tends to be less than 120 systolic repeat by me 150/80. She has upcoming dental implant was told she would have to hold her clopidogrel 3 days before and 1 day afterwards. She is not having palpitation edema shortness of breath or TIA. She tolerates lipid-lowering without muscle pain or weakness Her asthma remains under good control  Recent labs [facility] PCP 03/01/2021 TSH normal 1.0 Cholesterol 124 LDL 57 non-HDL cholesterol 73 CMP was normal Past Medical History:  Diagnosis Date   Allergic rhinitis, cause unspecified    Asthma    Chronic airway obstruction, not elsewhere classified    Coronary atherosclerosis of unspecified type of vessel, native or graft    COVID-19 09/2020   Mitral valve disorders(424.0)    Mononeuritis of unspecified site    Myalgia and myositis, unspecified    Peripheral vascular disease, unspecified (HCC)    Sleep disturbance, unspecified    Unspecified glaucoma(365.9)     Past Surgical History:  Procedure Laterality Date   ROTATOR CUFF REPAIR      Current Medications: Current Meds  Medication Sig   albuterol (PROAIR HFA) 108 (90 Base) MCG/ACT inhaler Inhale 2 puffs into the lungs every 6 (six) hours as needed for wheezing.   albuterol (PROVENTIL) (2.5  MG/3ML) 0.083% nebulizer solution Take 3 mLs (2.5 mg total) by nebulization 4 (four) times daily as needed for wheezing.   alclomethasone (ACLOVATE) 0.05 % ointment Apply 1 application topically as needed (rash).   Ascorbic Acid (VITAMIN C) 1000 MG tablet Take 1,000 mg by mouth daily.   calcium gluconate 500 MG tablet Take 500 mg by mouth 2 (two) times daily.      cholecalciferol (VITAMIN D) 1000 UNITS tablet Take 3,000 Units by mouth daily.   clarithromycin (BIAXIN) 500 MG tablet Take 500 mg by mouth as needed (infection).   clindamycin (CLEOCIN) 150 MG capsule Take 150 mg by mouth 4 (four) times daily. Prior to Dental Procedures   clopidogrel (PLAVIX) 75 MG tablet TAKE 1 TABLET BY MOUTH EVERY DAY   Coenzyme Q10 (COQ10) 100 MG CAPS Take 1 capsule by mouth daily.   dexlansoprazole (DEXILANT) 60 MG capsule Take 60 mg by mouth daily.   diclofenac sodium (VOLTAREN) 1 % GEL Apply 4 g topically 4 (four) times daily.   doxycycline (ADOXA) 50 MG tablet Take 50 mg by mouth daily.   doxycycline (VIBRAMYCIN) 50 MG capsule Take 50 mg by mouth daily.   DUREZOL 0.05 % EMUL Apply 1 mL topically as needed (dry skin).   EPINEPHrine 0.3 mg/0.3 mL IJ SOAJ injection Inject 0.3 mg into the muscle as needed for anaphylaxis.   famotidine (PEPCID) 40 MG tablet Take 40 mg by mouth 2 (two) times daily.   finasteride (PROSCAR) 5 MG tablet Take 2.5 mg by mouth daily.   fish oil-omega-3 fatty acids 1000 MG capsule Take 1 g by mouth daily. 4 by mouth daily   fluticasone (FLONASE) 50 MCG/ACT nasal spray Place 2 sprays into both nostrils daily.   fluticasone-salmeterol (ADVAIR) 100-50 MCG/ACT AEPB Inhale 1 puff into the lungs 2 (two) times daily.   gabapentin (NEURONTIN) 100 MG capsule Take 100 mg by mouth every morning.   gabapentin (NEURONTIN) 600 MG tablet Take 600 mg by mouth at bedtime.   Latanoprost (XELPROS) 0.005 % EMUL Place 1 drop into both eyes daily.   loratadine (CLARITIN) 10 MG tablet Take 1 tablet (10 mg total) by mouth daily.   meclizine (ANTIVERT) 12.5 MG tablet Take 12.5 mg by mouth 3 (three) times daily as needed for dizziness.   methocarbamol (ROBAXIN) 750 MG tablet Take 750 mg by mouth every 6 (six) hours as needed for muscle spasms.   Multiple Vitamins-Minerals (ZINC PO) Take 1 tablet by mouth daily. Unknown strength   omalizumab Geoffry Paradise(XOLAIR) 150 MG/ML prefilled  syringe Inject 150 mg into the skin every 28 (twenty-eight) days.   potassium chloride (KLOR-CON) 10 MEQ tablet Take 1 tablet (10 mEq total) by mouth daily.   pyridOXINE (VITAMIN B-6) 25 MG tablet Take 25 mg by mouth daily.   RESTASIS 0.05 % ophthalmic emulsion Place 1 drop into both eyes 2 (two) times daily.   rosuvastatin (CRESTOR) 5 MG tablet TAKE 1 TABLET BY MOUTH EVERY DAY   telmisartan (MICARDIS) 20 MG tablet Take 1 tablet (20 mg total) by mouth at bedtime.   vitamin B-12 (CYANOCOBALAMIN) 1000 MCG tablet Take 1,000 mcg by mouth daily.     Allergies:   Iodinated contrast media, Azithromycin, Iodine, Levofloxacin, Montelukast, Montelukast sodium, No known allergies, Cefaclor, Penicillins, Sulfa antibiotics, and Sulfonamide derivatives   Social History   Socioeconomic History   Marital status: Married    Spouse name: Not on file   Number of children: Not on file   Years of education: Not on  file   Highest education level: Not on file  Occupational History   Not on file  Tobacco Use   Smoking status: Never    Passive exposure: Never   Smokeless tobacco: Never  Vaping Use   Vaping Use: Never used  Substance and Sexual Activity   Alcohol use: No   Drug use: No   Sexual activity: Not on file  Other Topics Concern   Not on file  Social History Narrative   Not on file   Social Determinants of Health   Financial Resource Strain: Not on file  Food Insecurity: Not on file  Transportation Needs: Not on file  Physical Activity: Not on file  Stress: Not on file  Social Connections: Not on file     Family History: The patient's family history includes Emphysema in her maternal uncle; Heart disease in her father. ROS:   Please see the history of present illness.    All other systems reviewed and are negative.  EKGs/Labs/Other Studies Reviewed:    The following studies were reviewed today: EKG today shows sinus bradycardia 58 bpm otherwise normal EKG     Physical Exam:     VS:  BP (!) 160/96 (BP Location: Right Arm)    Pulse (!) 58    Ht 5\' 1"  (1.549 m)    Wt 161 lb 3.2 oz (73.1 kg)    SpO2 98%    BMI 30.46 kg/m     Wt Readings from Last 3 Encounters:  09/01/21 161 lb 3.2 oz (73.1 kg)  08/31/21 163 lb (73.9 kg)  08/03/21 161 lb 6.4 oz (73.2 kg)     GEN:  Well nourished, well developed in no acute distress HEENT: Normal NECK: No JVD; No carotid bruits LYMPHATICS: No lymphadenopathy CARDIAC: RRR, no murmurs, rubs, gallops RESPIRATORY:  Clear to auscultation without rales, wheezing or rhonchi  ABDOMEN: Soft, non-tender, non-distended MUSCULOSKELETAL:  No edema; No deformity  SKIN: Warm and dry NEUROLOGIC:  Alert and oriented x 3 PSYCHIATRIC:  Normal affect    Signed, 08/05/21, MD  09/01/2021 11:59 AM    [facility]

## 2021-09-02 ENCOUNTER — Encounter: Payer: Self-pay | Admitting: Podiatry

## 2021-09-02 ENCOUNTER — Ambulatory Visit (INDEPENDENT_AMBULATORY_CARE_PROVIDER_SITE_OTHER): Payer: Medicare Other | Admitting: Podiatry

## 2021-09-02 ENCOUNTER — Other Ambulatory Visit: Payer: Self-pay

## 2021-09-02 DIAGNOSIS — L84 Corns and callosities: Secondary | ICD-10-CM | POA: Diagnosis not present

## 2021-09-02 DIAGNOSIS — D689 Coagulation defect, unspecified: Secondary | ICD-10-CM

## 2021-09-02 DIAGNOSIS — B351 Tinea unguium: Secondary | ICD-10-CM | POA: Diagnosis not present

## 2021-09-02 NOTE — Progress Notes (Signed)
°  Subjective:  Patient ID: Debra Byrd, female    DOB: 31-Dec-1945,  MRN: [id number]  Chief Complaint  Patient presents with   Callouses    I have some calluses that need to be trimmed    Nail Problem    I have some toenails that need to be trimmed   76 y.o. female presents with the above complaint. History confirmed with patient. Hx as above. On plavix.  Nodal, Joline Salt, PA-C   Objective:  Physical Exam: warm, good capillary refill, nail exam onychomycosis of the toenails, no trophic changes or ulcerative lesions, normal DP and PT pulses, and normal sensory exam. Punctate hyperkeratosis submet 2 bilat, HPK medial 1st MPJ bilat   No images are attached to the encounter.  Assessment:   1. Onychomycosis   2. Coagulation defect (HCC)   3. Callus     Plan:  Patient was evaluated and treated and all questions answered.  Onychomycosis and Coagulation Defect, Porokeratosis -Nails palliatively debrided secondary to pain   Procedure: Nail Debridement Type of Debridement: manual, sharp debridement. Instrumentation: Nail nipper, rotary burr. Number of Nails: 10   Procedure: Paring of Lesion Rationale: painful hyperkeratotic lesion Type of Debridement: manual, sharp debridement. Instrumentation: 312 blade Number of Lesions: 3

## 2021-09-03 ENCOUNTER — Ambulatory Visit: Payer: Medicare Other | Admitting: Cardiology

## 2021-09-20 ENCOUNTER — Other Ambulatory Visit: Payer: Self-pay | Admitting: Pharmacy Technician

## 2021-09-23 ENCOUNTER — Telehealth: Payer: Self-pay | Admitting: Internal Medicine

## 2021-09-23 MED ORDER — ALBUTEROL SULFATE HFA 108 (90 BASE) MCG/ACT IN AERS: 2.0000 | INHALATION_SPRAY | Freq: Four times a day (QID) | RESPIRATORY_TRACT | 4 refills | Status: DC | PRN

## 2021-09-23 MED ORDER — EPINEPHRINE 0.3 MG/0.3ML IJ SOAJ: 0.3000 mg | INTRAMUSCULAR | 4 refills | Status: DC | PRN

## 2021-09-23 MED ORDER — LORATADINE 10 MG PO TABS: 10.0000 mg | ORAL_TABLET | Freq: Every day | ORAL | 4 refills | Status: DC

## 2021-09-23 MED ORDER — FLUTICASONE-SALMETEROL 100-50 MCG/ACT IN AEPB: 1.0000 | INHALATION_SPRAY | Freq: Two times a day (BID) | RESPIRATORY_TRACT | 4 refills | Status: DC

## 2021-09-23 MED ORDER — FLUTICASONE PROPIONATE 50 MCG/ACT NA SUSP: 2.0000 | Freq: Every day | NASAL | 4 refills | Status: DC

## 2021-09-23 NOTE — Telephone Encounter (Signed)
Meds have been sent to pharmacy for pt. Called and spoke with pt letting her know this had been done and she verbalized understanding. Nothing further needed. 

## 2021-09-28 ENCOUNTER — Other Ambulatory Visit: Payer: Self-pay

## 2021-09-28 ENCOUNTER — Ambulatory Visit (INDEPENDENT_AMBULATORY_CARE_PROVIDER_SITE_OTHER): Payer: Medicare Other

## 2021-09-28 VITALS — BP 124/74 | HR 52 | Temp 98.1°F | Resp 18 | Ht 61.0 in | Wt 161.4 lb

## 2021-09-28 DIAGNOSIS — J4540 Moderate persistent asthma, uncomplicated: Secondary | ICD-10-CM | POA: Diagnosis not present

## 2021-09-28 MED ORDER — OMALIZUMAB 150 MG/ML ~~LOC~~ SOSY
150.0000 mg | PREFILLED_SYRINGE | Freq: Once | SUBCUTANEOUS | Status: AC
  Administered 2021-09-28: 150 mg via SUBCUTANEOUS
  Filled 2021-09-28: qty 1

## 2021-09-28 MED ORDER — EPINEPHRINE 0.3 MG/0.3ML IJ SOAJ: 0.3000 mg | Freq: Once | INTRAMUSCULAR | Status: DC | PRN

## 2021-09-28 MED ORDER — SODIUM CHLORIDE 0.9 % IV SOLN: Freq: Once | INTRAVENOUS | Status: DC | PRN

## 2021-09-28 MED ORDER — DIPHENHYDRAMINE HCL 50 MG/ML IJ SOLN: 50.0000 mg | Freq: Once | INTRAMUSCULAR | Status: DC | PRN

## 2021-09-28 MED ORDER — FAMOTIDINE IN NACL 20-0.9 MG/50ML-% IV SOLN: 20.0000 mg | Freq: Once | INTRAVENOUS | Status: DC | PRN

## 2021-09-28 MED ORDER — ALBUTEROL SULFATE HFA 108 (90 BASE) MCG/ACT IN AERS: 2.0000 | INHALATION_SPRAY | Freq: Once | RESPIRATORY_TRACT | Status: DC | PRN

## 2021-09-28 MED ORDER — METHYLPREDNISOLONE SODIUM SUCC 125 MG IJ SOLR: 125.0000 mg | Freq: Once | INTRAMUSCULAR | Status: DC | PRN

## 2021-09-28 NOTE — Progress Notes (Signed)
Diagnosis: Asthma  Provider:  Praveen Mannam, MD  Procedure: Injection  Xolair (Omalizumab), Dose: 150 mg, Site: subcutaneous, Number of injections: 1  Discharge: Condition: Good, Destination: Home . AVS provided to patient.   Performed by:  Tomekia Helton, RN       

## 2021-10-26 ENCOUNTER — Other Ambulatory Visit: Payer: Self-pay

## 2021-10-26 ENCOUNTER — Ambulatory Visit (INDEPENDENT_AMBULATORY_CARE_PROVIDER_SITE_OTHER): Payer: Medicare Other | Admitting: *Deleted

## 2021-10-26 VITALS — BP 154/82 | HR 57 | Temp 98.0°F | Resp 16 | Ht 61.0 in | Wt 160.4 lb

## 2021-10-26 DIAGNOSIS — J4540 Moderate persistent asthma, uncomplicated: Secondary | ICD-10-CM

## 2021-10-26 MED ORDER — OMALIZUMAB 150 MG/ML ~~LOC~~ SOSY
150.0000 mg | PREFILLED_SYRINGE | Freq: Once | SUBCUTANEOUS | Status: AC
  Administered 2021-10-26: 150 mg via SUBCUTANEOUS
  Filled 2021-10-26: qty 1

## 2021-10-26 MED ORDER — SODIUM CHLORIDE 0.9 % IV SOLN: Freq: Once | INTRAVENOUS | Status: DC | PRN

## 2021-10-26 MED ORDER — ALBUTEROL SULFATE HFA 108 (90 BASE) MCG/ACT IN AERS: 2.0000 | INHALATION_SPRAY | Freq: Once | RESPIRATORY_TRACT | Status: DC | PRN

## 2021-10-26 MED ORDER — FAMOTIDINE IN NACL 20-0.9 MG/50ML-% IV SOLN: 20.0000 mg | Freq: Once | INTRAVENOUS | Status: DC | PRN

## 2021-10-26 MED ORDER — DIPHENHYDRAMINE HCL 50 MG/ML IJ SOLN: 50.0000 mg | Freq: Once | INTRAMUSCULAR | Status: DC | PRN

## 2021-10-26 MED ORDER — EPINEPHRINE 0.3 MG/0.3ML IJ SOAJ: 0.3000 mg | Freq: Once | INTRAMUSCULAR | Status: DC | PRN

## 2021-10-26 MED ORDER — METHYLPREDNISOLONE SODIUM SUCC 125 MG IJ SOLR: 125.0000 mg | Freq: Once | INTRAMUSCULAR | Status: DC | PRN

## 2021-10-26 NOTE — Progress Notes (Signed)
Diagnosis: Asthma  Provider:  Praveen Mannam, MD  Procedure: Injection  Xolair (Omalizumab), Dose: 150 mg, Site: subcutaneous, Number of injections: 1  Discharge: Condition: Good, Destination: Home . AVS provided to patient.   Performed by:  Neidy Guerrieri A, RN       

## 2021-11-19 ENCOUNTER — Other Ambulatory Visit: Payer: Self-pay | Admitting: Cardiology

## 2021-11-23 ENCOUNTER — Ambulatory Visit: Payer: Medicare Other | Admitting: Cardiology

## 2021-11-23 ENCOUNTER — Ambulatory Visit (INDEPENDENT_AMBULATORY_CARE_PROVIDER_SITE_OTHER): Payer: Medicare Other

## 2021-11-23 VITALS — BP 148/84 | HR 59 | Temp 98.4°F | Resp 20 | Ht 61.0 in | Wt 160.4 lb

## 2021-11-23 DIAGNOSIS — J4540 Moderate persistent asthma, uncomplicated: Secondary | ICD-10-CM

## 2021-11-23 MED ORDER — OMALIZUMAB 150 MG/ML ~~LOC~~ SOSY
150.0000 mg | PREFILLED_SYRINGE | Freq: Once | SUBCUTANEOUS | Status: AC
  Administered 2021-11-23: 150 mg via SUBCUTANEOUS
  Filled 2021-11-23: qty 1

## 2021-11-23 NOTE — Progress Notes (Signed)
Diagnosis: Asthma ? ?Provider:  Marshell Garfinkel, MD ? ?Procedure: Injection ? ?Xolair (Omalizumab), Dose: 150 mg, Site: subcutaneous, Number of injections: 1 ? ?Discharge: Condition: Good, Destination: Home . AVS declined. ? ?Performed by:  Koren Shiver, RN  ? ? ? ?  ?

## 2021-12-02 ENCOUNTER — Ambulatory Visit (INDEPENDENT_AMBULATORY_CARE_PROVIDER_SITE_OTHER): Payer: Medicare Other | Admitting: Podiatry

## 2021-12-02 ENCOUNTER — Encounter: Payer: Self-pay | Admitting: Podiatry

## 2021-12-02 ENCOUNTER — Ambulatory Visit: Payer: Medicare Other | Admitting: Podiatry

## 2021-12-02 DIAGNOSIS — L84 Corns and callosities: Secondary | ICD-10-CM

## 2021-12-02 DIAGNOSIS — Q828 Other specified congenital malformations of skin: Secondary | ICD-10-CM

## 2021-12-02 DIAGNOSIS — B351 Tinea unguium: Secondary | ICD-10-CM

## 2021-12-02 DIAGNOSIS — D689 Coagulation defect, unspecified: Secondary | ICD-10-CM | POA: Diagnosis not present

## 2021-12-02 DIAGNOSIS — M79675 Pain in left toe(s): Secondary | ICD-10-CM | POA: Diagnosis not present

## 2021-12-02 DIAGNOSIS — M79674 Pain in right toe(s): Secondary | ICD-10-CM

## 2021-12-02 NOTE — Patient Instructions (Addendum)
Shoe and Sneaker Recommendations:  ?*Purchase sneakers with mesh (soft, stretchable) uppers. Avoid leather sneakers.* ? ?Recommend Skechers Loafers with stretchable uppers and memory foam insoles. They can be purchased at [employer], [employer] or [employer]. Also on [employer].   ? ?2. New Balance Sneakers 600 Series or Higher ([email or web address].com) or [employer] ?*Purchase sneakers with mesh (soft, stretchable) uppers. Avoid leather sneakers.* ? ?3.  Brooks Beast: [employer] ?*Purchase sneakers with mesh (soft, stretchable) uppers. Avoid leather sneakers.*  ?

## 2021-12-12 NOTE — Progress Notes (Signed)
?  Subjective:  ?Patient ID: Debra Byrd, female    DOB: 1946/03/27,  MRN: [id number] ? ?Debra Byrd presents to clinic today for at risk foot care with h/o coagulation defect and callus(es) b/l lower extremities and painful thick toenails that are difficult to trim. Painful toenails interfere with ambulation. Aggravating factors include wearing enclosed shoe gear. Pain is relieved with periodic professional debridement. Painful calluses are aggravated when weightbearing with and without shoegear. Pain is relieved with periodic professional debridement. ? ?She is on blood thinner, clopidogrel. ? ?New problem(s): None.  ? ?PCP is Nodal, Alphonzo Dublin, PA-C , and last visit was September 07, 2021. ? ?Allergies  ?Allergen Reactions  ? Iodinated Contrast Media Shortness Of Breath and Swelling  ?  Swelling in facial/No issues with shellfish ?  ? Azithromycin Hives  ?  No problem with Biaxin  ? Iodine Swelling  ? Levofloxacin   ?  Tendon tears  ? Montelukast Other (See Comments)  ? Montelukast Sodium Swelling  ? No Known Allergies Other (See Comments)  ? Cefaclor Rash  ? Penicillins Swelling  ? Sulfa Antibiotics Rash  ? Sulfonamide Derivatives Rash  ? ? ?Review of Systems: Negative except as noted in the HPI. ? ?Objective: No changes noted in today's physical examination. ?Objective:  ? ?Vascular Examination: ?Vascular status intact b/l with palpable pedal pulses. Pedal hair present b/l. CFT immediate b/l. No edema. No pain with calf compression b/l. Skin temperature gradient WNL b/l.  ? ?Neurological Examination: ?Sensation grossly intact b/l with 10 gram monofilament. Vibratory sensation intact b/l.  ? ?Dermatological Examination: ?Pedal skin with normal turgor, texture and tone b/l. Toenails 1-5 b/l thick, discolored, elongated with subungual debris and pain on dorsal palpation.  Hyperkeratotic lesion(s) 1st metatarsal head b/l lower extremities.  No erythema, no edema, no drainage, no fluctuance. Porokeratotic lesion(s)  sulcus area left foot. No erythema, no edema, no drainage, no fluctuance. ? ?Musculoskeletal Examination: ?Muscle strength 5/5 to b/l LE.  Bunion deformity b/l. ? ?Radiographs: None ? ?Assessment/Plan: ?1. Onychomycosis   ?2. Callus   ?3. Porokeratosis   ?4. Coagulation defect ([place])   ?  ?-Patient was evaluated and treated. All patient's and/or POA's questions/concerns answered on today's visit. ?-Mycotic toenails 1-5 bilaterally were debrided in length and girth with sterile nail nippers and dremel without incident. ?-Callus(es) 1st metatarsal head b/l lower extremities pared utilizing mandrel sander without complication or incident. Total number debrided =2. ?-Painful porokeratotic lesion(s) sulcus area left foot pared and enucleated with sterile scalpel blade without incident. Total number of lesions debrided=1. ?-Shoe recommendations given for Skechers, New Balance or Brooks. ?-Patient/POA to call should there be question/concern in the interim.  ? ?Return in about 3 months (around 03/03/2022). ? ?Marzetta Board, DPM  ?

## 2021-12-21 ENCOUNTER — Telehealth: Payer: Self-pay | Admitting: Internal Medicine

## 2021-12-21 ENCOUNTER — Ambulatory Visit (INDEPENDENT_AMBULATORY_CARE_PROVIDER_SITE_OTHER): Payer: Medicare Other

## 2021-12-21 VITALS — BP 112/73 | HR 58 | Temp 97.4°F | Resp 18 | Ht 61.0 in | Wt 159.8 lb

## 2021-12-21 DIAGNOSIS — J4540 Moderate persistent asthma, uncomplicated: Secondary | ICD-10-CM | POA: Diagnosis not present

## 2021-12-21 MED ORDER — OMALIZUMAB 150 MG/ML ~~LOC~~ SOSY
150.0000 mg | PREFILLED_SYRINGE | Freq: Once | SUBCUTANEOUS | Status: AC
  Administered 2021-12-21: 150 mg via SUBCUTANEOUS
  Filled 2021-12-21: qty 1

## 2021-12-21 NOTE — Progress Notes (Signed)
Diagnosis: Asthma ? ?Provider:  Chilton Greathouse, MD ? ?Procedure: Injection ? ?Xolair (Omalizumab), Dose: 150 mg, Site: subcutaneous, Number of injections: 1 ? ?Discharge: Condition: Good, Destination: Home . AVS provided to patient.  ? ?Performed by:  Adriana Mccallum, RN  ? ? ? ?  ?

## 2021-12-21 NOTE — Telephone Encounter (Signed)
Called and spoke with patient regarding prescriptions for prednisone and Biaxin.  She states that Dr. Maple Hudson gives her a prescription to have on hand in case she starts to have respiratory symptoms.  She does not have a f/u with Dr. Maple Hudson until August and she is leaving on a 12 day trip to [place] and [place] and would like a prescription sent to her pharmacy.  I advised her that I would send Dr. Maple Hudson a message and once we hear back from him we will call her back and let her know his recommendations.  I let her know it would likely be tomorrow before we call her back.  She verbalized understanding. ? ?Dr. Maple Hudson, ?Please advise regarding prescription to her pharmacy for prednisone and Biaxin to have on hand for her trip out of state.  Thank you. ?

## 2021-12-22 ENCOUNTER — Other Ambulatory Visit: Payer: Self-pay | Admitting: Internal Medicine

## 2021-12-22 MED ORDER — PREDNISONE 10 MG PO TABS: ORAL_TABLET | ORAL | 0 refills | Status: DC

## 2021-12-22 MED ORDER — CLARITHROMYCIN 500 MG PO TABS: ORAL_TABLET | ORAL | 1 refills | Status: DC

## 2021-12-22 NOTE — Telephone Encounter (Signed)
Called and spoke with patient. She is aware that [facility] has sent in the medications for her.  ? ?Nothing further needed at time of call.  ?

## 2021-12-22 NOTE — Telephone Encounter (Signed)
Scripts sent

## 2022-01-12 ENCOUNTER — Other Ambulatory Visit: Payer: Self-pay | Admitting: Cardiology

## 2022-01-18 ENCOUNTER — Ambulatory Visit (INDEPENDENT_AMBULATORY_CARE_PROVIDER_SITE_OTHER): Payer: Medicare Other

## 2022-01-18 VITALS — BP 146/89 | HR 59 | Temp 97.8°F | Resp 18 | Ht 61.0 in | Wt 156.8 lb

## 2022-01-18 DIAGNOSIS — J4540 Moderate persistent asthma, uncomplicated: Secondary | ICD-10-CM | POA: Diagnosis not present

## 2022-01-18 MED ORDER — OMALIZUMAB 150 MG/ML ~~LOC~~ SOSY
150.0000 mg | PREFILLED_SYRINGE | Freq: Once | SUBCUTANEOUS | Status: AC
  Administered 2022-01-18: 150 mg via SUBCUTANEOUS
  Filled 2022-01-18: qty 1

## 2022-01-18 NOTE — Progress Notes (Signed)
Diagnosis: Asthma ? ?Provider:  Praveen Mannam, MD ? ?Procedure: Injection ? ?Xolair (Omalizumab), Dose: 150 mg, Site: subcutaneous, Number of injections: 1 ? ?Discharge: Condition: Good, Destination: Home . AVS provided to patient.  ? ?Performed by:  Lyndell Gillyard, RN  ? ? ? ?  ?

## 2022-02-15 ENCOUNTER — Ambulatory Visit (INDEPENDENT_AMBULATORY_CARE_PROVIDER_SITE_OTHER): Payer: Medicare Other

## 2022-02-15 VITALS — BP 138/82 | HR 53 | Temp 97.9°F | Resp 18 | Ht 61.0 in | Wt 158.6 lb

## 2022-02-15 DIAGNOSIS — J4540 Moderate persistent asthma, uncomplicated: Secondary | ICD-10-CM

## 2022-02-15 MED ORDER — OMALIZUMAB 150 MG/ML ~~LOC~~ SOSY
150.0000 mg | PREFILLED_SYRINGE | Freq: Once | SUBCUTANEOUS | Status: AC
  Administered 2022-02-15: 150 mg via SUBCUTANEOUS
  Filled 2022-02-15: qty 1

## 2022-02-15 NOTE — Progress Notes (Signed)
Diagnosis: Asthma  Provider:  Chilton Greathouse, MD  Procedure: Injection  Xolair (Omalizumab), Dose: 150 mg, Site: subcutaneous, Number of injections: 1  Discharge: Condition: Good, Destination: Home . AVS provided to patient.   Performed by:  Garnette Czech, RN

## 2022-02-28 ENCOUNTER — Telehealth: Payer: Self-pay | Admitting: Cardiology

## 2022-02-28 NOTE — Telephone Encounter (Signed)
Advised patient that per PharmD she is okay to take vitamin K

## 2022-02-28 NOTE — Telephone Encounter (Signed)
Pt c/o medication issue:  1. Name of Medication: vitamin k  2. How are you currently taking this medication (dosage and times per day)? Not taking yet  3. Are you having a reaction (difficulty breathing--STAT)? no  4. What is your medication issue? Patient states she was diagnosed with osteoporosis and was prescribed calcium D3 and vitamin k. She says she is not sure if it is okay for her to take vitamin k since she is on plavix. She says her pharmacy said it should not be an issue, but to contact cardiology to be sure.

## 2022-02-28 NOTE — Telephone Encounter (Signed)
Ok to take vitamin K, does not affect Plavix.

## 2022-03-03 ENCOUNTER — Ambulatory Visit: Payer: Medicare Other | Admitting: Podiatry

## 2022-03-04 ENCOUNTER — Telehealth: Payer: Self-pay | Admitting: Pharmacist

## 2022-03-04 DIAGNOSIS — J4540 Moderate persistent asthma, uncomplicated: Secondary | ICD-10-CM

## 2022-03-04 MED ORDER — OMALIZUMAB 150 MG/ML ~~LOC~~ SOSY: 150.0000 mg | PREFILLED_SYRINGE | SUBCUTANEOUS | 0 refills | Status: DC

## 2022-03-04 NOTE — Telephone Encounter (Signed)
Refill sent for XOLAIR to  [facility]  Dose: 150 mg SQ every 4 weeks  Last OV: 04/13/21 Provider: Dr. Maple Hudson  Next OV: 04/12/22  Chesley Mires, PharmD, MPH, BCPS Clinical Pharmacist (Rheumatology and Pulmonology)

## 2022-03-10 ENCOUNTER — Encounter: Payer: Self-pay | Admitting: Podiatry

## 2022-03-10 ENCOUNTER — Ambulatory Visit (INDEPENDENT_AMBULATORY_CARE_PROVIDER_SITE_OTHER): Payer: Medicare Other | Admitting: Podiatry

## 2022-03-10 DIAGNOSIS — B351 Tinea unguium: Secondary | ICD-10-CM

## 2022-03-10 DIAGNOSIS — M79674 Pain in right toe(s): Secondary | ICD-10-CM

## 2022-03-10 DIAGNOSIS — M79675 Pain in left toe(s): Secondary | ICD-10-CM | POA: Diagnosis not present

## 2022-03-10 DIAGNOSIS — Q828 Other specified congenital malformations of skin: Secondary | ICD-10-CM | POA: Diagnosis not present

## 2022-03-10 DIAGNOSIS — Z91199 Patient's noncompliance with other medical treatment and regimen due to unspecified reason: Secondary | ICD-10-CM

## 2022-03-10 DIAGNOSIS — D689 Coagulation defect, unspecified: Secondary | ICD-10-CM | POA: Diagnosis not present

## 2022-03-10 DIAGNOSIS — L84 Corns and callosities: Secondary | ICD-10-CM | POA: Diagnosis not present

## 2022-03-10 NOTE — Progress Notes (Signed)
Rescheduled for earlier appointment.

## 2022-03-15 ENCOUNTER — Ambulatory Visit (INDEPENDENT_AMBULATORY_CARE_PROVIDER_SITE_OTHER): Payer: Medicare Other

## 2022-03-15 VITALS — BP 132/76 | HR 61 | Temp 97.6°F | Resp 20 | Ht 61.0 in | Wt 158.8 lb

## 2022-03-15 DIAGNOSIS — J4540 Moderate persistent asthma, uncomplicated: Secondary | ICD-10-CM | POA: Diagnosis not present

## 2022-03-15 MED ORDER — OMALIZUMAB 150 MG/ML ~~LOC~~ SOSY
150.0000 mg | PREFILLED_SYRINGE | Freq: Once | SUBCUTANEOUS | Status: AC
  Administered 2022-03-15: 150 mg via SUBCUTANEOUS
  Filled 2022-03-15: qty 1

## 2022-03-15 NOTE — Progress Notes (Signed)
Diagnosis: Asthma  Provider:  Praveen Mannam, MD  Procedure: Injection  Xolair (Omalizumab), Dose: 150 mg, Site: subcutaneous, Number of injections: 1  Discharge: Condition: Good, Destination: Home . AVS provided to patient.   Performed by:  Jayvien Rowlette E Quinton Voth, LPN       

## 2022-03-18 NOTE — Progress Notes (Signed)
  Subjective:  Patient ID: Debra Byrd, female    DOB: [birth year],  MRN: [id number]  Debra Byrd presents to clinic today for at risk foot care with h/o coagulation defect and painful porokeratotic lesion(s) b/l lower extremities and painful mycotic toenails that limit ambulation. Painful toenails interfere with ambulation. Aggravating factors include wearing enclosed shoe gear. Pain is relieved with periodic professional debridement. Painful porokeratotic lesions are aggravated when weightbearing with and without shoegear. Pain is relieved with periodic professional debridement.  New problem(s): None.   PCP is Nodal, Joline Salt, PA-C , and last visit was  March 08, 2022  Allergies  Allergen Reactions   Iodinated Contrast Media Shortness Of Breath and Swelling    Swelling in facial/No issues with shellfish    Azithromycin Hives    No problem with Biaxin   Iodine Swelling   Levofloxacin     Tendon tears   Misc. Sulfonamide Containing Compounds    Montelukast Other (See Comments)   Montelukast Sodium Swelling   No Known Allergies Other (See Comments)   Cefaclor Rash   Penicillins Swelling   Sulfa Antibiotics Rash   Sulfonamide Derivatives Rash    Review of Systems: Negative except as noted in the HPI.  Objective:   Vascular Examination: Vascular status intact b/l with palpable pedal pulses. Pedal hair present b/l. CFT immediate b/l. No edema. No pain with calf compression b/l. Skin temperature gradient WNL b/l.   Neurological Examination: Sensation grossly intact b/l with 10 gram monofilament. Vibratory sensation intact b/l.   Dermatological Examination: Pedal skin with normal turgor, texture and tone b/l. Toenails 1-5 b/l thick, discolored, elongated with subungual debris and pain on dorsal palpation.  Hyperkeratotic lesion(s) 1st metatarsal head b/l lower extremities.  No erythema, no edema, no drainage, no fluctuance. Porokeratotic lesion(s) sulcus area b/l. No erythema,  no edema, no drainage, no fluctuance.  Musculoskeletal Examination: Muscle strength 5/5 to b/l LE. Bunion deformity b/l. Patient ambulates independent of any assistive aids.  Radiographs: None  Assessment/Plan: 1. Pain due to onychomycosis of toenails of both feet   2. Porokeratosis   3. Callus   4. Coagulation defect (HCC)   -Examined patient. -No new findings. No new orders. -Toenails 1-5 b/l were debrided in length and girth with sterile nail nippers and dremel without iatrogenic bleeding.  -Callus(es) 1st metatarsal head b/l lower extremities pared utilizing sterile scalpel blade without complication or incident. Total number debrided =2. -Porokeratotic lesion(s) sulcus area b/l pared and enucleated with sterile scalpel blade without incident. Total number of lesions debrided=2. -Patient/POA to call should there be question/concern in the interim.   Return in about 3 months (around 06/10/2022).  Freddie Breech, DPM

## 2022-03-30 ENCOUNTER — Telehealth: Payer: Self-pay | Admitting: Cardiology

## 2022-03-30 NOTE — Telephone Encounter (Signed)
I spoke with the patient. I advised her that per Dr. Dulce Sellar- she will need to continue plavix, but he does not recommend she take Advil with this due to her increased risk of bleeding. The patient voices understanding and is agreeable.

## 2022-03-30 NOTE — Telephone Encounter (Signed)
To Dr. Dulce Sellar to review.  It looks like the patient is currently on clopidogrel 75 mg once daily for right carotid artery stenosis.  Thoughts regarding plavix & advil short term?

## 2022-03-30 NOTE — Telephone Encounter (Signed)
Patient just had dental work done and wants to know if she can take Advil with Plavix or can she come off Plavix for a few days while she takes anti-flammatory medication for tooth pain.

## 2022-03-30 NOTE — Telephone Encounter (Signed)
[place], [place] - 03/30/2022  8:19 AM Dulce Sellar Iline Oven, MD  Sent: Wed March 30, 2022 12:06 PM  To: Jefferey Pica, RN          Message  I would not    ----- Message -----  From: Jefferey Pica, RN  Sent: 03/30/2022  11:48 AM EDT  To: Baldo Daub, MD; L[facility]/Hp Triage    ----- Message from Jefferey Pica, RN sent at 03/30/2022 11:48 AM EDT -----  Sorry Dr. Dulce Sellar- please clarify "ok"- thanks!  Ok that she can take plavix and advil together short term?

## 2022-04-09 ENCOUNTER — Other Ambulatory Visit: Payer: Self-pay | Admitting: Cardiology

## 2022-04-10 NOTE — Progress Notes (Signed)
Subjective:    Patient ID: Debra Byrd, female    DOB: Oct 13, 1945, 76 y.o.   MRN: [id number]  HPI Female never smoker followed for chronic obstructive asthma/Xolair/often steroid dependent, complicated by allergic rhinitis, peripheral neuropathy, glaucoma,  Offce Spirometry 08/08/2016-NL. FVC 2.04/77%, FEV1 1.64/81%, FEV1/FVC 0.80 HST 07/26/13- WNL  AHI 4.4/ hr, desaturation to 73%, body weight 175 lbs --------------------------------------------------     04/13/21- 76 year old female never smoker followed for chronic obstructive asthma/Xolair/ often steroid dependent, complicated by allergic rhinitis, peripheral neuropathy, Glaucoma, Covid infection Feb 2022,  -Advair 100, pro-air HFA, Xolair, neb albuterol, benzonatate Covid vax-2 Phizer Had cough and substernal soreness earlier this year.  Cardiology w/u  neg. Second acid blocker added and doing well now. Considers asthma control good and credits Xolair. Addenddum 06/30/21- Patient indicates she cannot self-inject due to glaucoma and peripheral neuropathy. CXR 10/16/20- IMPRESSION: Atelectasis left base. Lungs elsewhere clear. Heart size within normal limits.  04/12/22- 76 year old female never smoker followed for chronic obstructive asthma/Xolair/ often steroid dependent, complicated by allergic rhinitis, peripheral neuropathy, Glaucoma, Covid infection Feb 2022,  -Advair 100, pro-air HFA, Xolair, neb albuterol, benzonatate Covid vax-2 Phizer ACT score-23 -----Pt f/u, had her injection this morning and feels like she is doing well.  She continues to believe Xolair has made a huge difference in her quality of life with dramatic reduction in incidence of asthma exacerbation, systemic steroids and hospitalization.  She describes inability to give her own injections, poor eyesight and other reasons.  Concerns at infusion center. She feels increasing cough is time comes due for next Xolair injection.  Currently doing well.  Again asks standby  prescription for prednisone and Biaxin to hold in case of bronchitis in the coming winter-discussed.  ROS-see HPI-+ = positive Constitutional:   No-   weight loss, night sweats, fevers, chills, +fatigue, lassitude. HEENT:   No-   difficulty swallowing, tooth/dental problems, sore throat,       No-  sneezing, itching, ear ache,  nasal congestion, post nasal drip,  CV:  + Noncardiac chest pain, no- orthopnea, PND, swelling in lower extremities,                                           anasarca, dizziness, palpitations Resp:+shortness of breath with exertion or at rest.               productive cough,   non-productive cough,  No- coughing up of blood.                change in color of mucus.  +Little wheezing.   Skin: No-   rash or lesions. GI:  No-   heartburn, indigestion, abdominal pain, nausea, vomiting,  GU: pain. MS:  +joint pain or swelling.   Neuro-     nothing unusualk pain. Psych:  No- change in mood or affect. No depression or anxiety.  No memory loss.   Objective:   Physical Exam              General- Alert, Oriented, Affect-appropriate, Distress-none acute, + overweight,  Skin- rash-none, lesions- none, excoriation- none Lymphadenopathy- none Head- atraumatic            Eyes- Gross vision intact, PERRLA, conjunctivae clear secretions. Chronic periorbital edema.            Ears- Hearing, canals-normal  Nose- Clear, no-Septal dev, mucus, polyps, erosion, perforation             Throat- Mallampati II-III , mucosa clear , drainage- none, tonsils- atrophic,  Neck- flexible , trachea midline, no stridor , thyroid nl, carotid no bruit Chest - symmetrical excursion , unlabored           Heart/CV- RRR , no murmur , no gallop  , no rub, nl s1 s2                           - JVD- none , edema- none, stasis changes- none, varices- none           Lung- clear, unlabored, dullness-none, rub- none,  Cough- none           Chest wall-  Abd-  Br/ Gen/ Rectal- Not done, not  indicated Extrem- cyanosis- none, clubbing, none, atrophy- none, strength- nl Neuro- grossly intact to observation

## 2022-04-11 ENCOUNTER — Other Ambulatory Visit: Payer: Self-pay

## 2022-04-12 ENCOUNTER — Ambulatory Visit (INDEPENDENT_AMBULATORY_CARE_PROVIDER_SITE_OTHER): Payer: Medicare Other | Admitting: Internal Medicine

## 2022-04-12 ENCOUNTER — Ambulatory Visit (INDEPENDENT_AMBULATORY_CARE_PROVIDER_SITE_OTHER): Payer: Medicare Other | Admitting: *Deleted

## 2022-04-12 ENCOUNTER — Encounter: Payer: Self-pay | Admitting: Internal Medicine

## 2022-04-12 VITALS — BP 123/72 | HR 67 | Temp 97.8°F | Resp 16 | Ht 61.0 in | Wt 155.8 lb

## 2022-04-12 DIAGNOSIS — J4540 Moderate persistent asthma, uncomplicated: Secondary | ICD-10-CM

## 2022-04-12 DIAGNOSIS — I2510 Atherosclerotic heart disease of native coronary artery without angina pectoris: Secondary | ICD-10-CM | POA: Diagnosis not present

## 2022-04-12 DIAGNOSIS — I6521 Occlusion and stenosis of right carotid artery: Secondary | ICD-10-CM | POA: Diagnosis not present

## 2022-04-12 MED ORDER — CLARITHROMYCIN 500 MG PO TABS: ORAL_TABLET | ORAL | 1 refills | Status: DC

## 2022-04-12 MED ORDER — OMALIZUMAB 150 MG/ML ~~LOC~~ SOSY
150.0000 mg | PREFILLED_SYRINGE | Freq: Once | SUBCUTANEOUS | Status: AC
  Administered 2022-04-12: 150 mg via SUBCUTANEOUS
  Filled 2022-04-12: qty 1

## 2022-04-12 MED ORDER — PREDNISONE 10 MG PO TABS: ORAL_TABLET | ORAL | 0 refills | Status: DC

## 2022-04-12 NOTE — Progress Notes (Signed)
Diagnosis: Asthma  Provider:  Chilton Greathouse MD  Procedure: Injection  Xolair (Omalizumab), Dose: 150 mg, Site: subcutaneous, Number of injections: 1  Discharge: Condition: Good, Destination: Home . AVS provided to patient.   Performed by:  Forrest Moron, RN

## 2022-04-12 NOTE — Patient Instructions (Signed)
We can continue current meds. Refills sent as requested for Biaxin and prednisone taper.  Please call if we can help

## 2022-04-14 ENCOUNTER — Ambulatory Visit: Payer: Medicare Other | Admitting: Podiatry

## 2022-04-27 ENCOUNTER — Encounter: Payer: Self-pay | Admitting: Internal Medicine

## 2022-04-27 NOTE — Assessment & Plan Note (Signed)
Continues to benefit significantly from Xolair. Plan-continue current meds.  Standby prescriptions for prednisone and Biaxin as before.

## 2022-04-27 NOTE — Assessment & Plan Note (Signed)
She is aware and feels this problem is being well managed.  No acute issues.

## 2022-04-29 ENCOUNTER — Telehealth: Payer: Self-pay | Admitting: Internal Medicine

## 2022-04-29 NOTE — Telephone Encounter (Signed)
Created in error

## 2022-05-10 ENCOUNTER — Ambulatory Visit (INDEPENDENT_AMBULATORY_CARE_PROVIDER_SITE_OTHER): Payer: Medicare Other

## 2022-05-10 VITALS — BP 133/76 | HR 60 | Temp 98.2°F | Resp 18 | Ht 61.0 in | Wt 155.8 lb

## 2022-05-10 DIAGNOSIS — J4540 Moderate persistent asthma, uncomplicated: Secondary | ICD-10-CM

## 2022-05-10 MED ORDER — OMALIZUMAB 150 MG/ML ~~LOC~~ SOSY
150.0000 mg | PREFILLED_SYRINGE | Freq: Once | SUBCUTANEOUS | Status: AC
  Administered 2022-05-10: 150 mg via SUBCUTANEOUS
  Filled 2022-05-10: qty 1

## 2022-05-10 NOTE — Progress Notes (Signed)
Diagnosis: Asthma  Provider:  Marshell Garfinkel MD  Procedure: Injection  Xolair (Omalizumab), Dose: 150 mg, Site: subcutaneous, Number of injections: 1  Discharge: Condition: Good, Destination: Home . AVS provided to patient.   Performed by:  Arnoldo Morale, RN

## 2022-05-24 ENCOUNTER — Telehealth: Payer: Self-pay | Admitting: Pharmacy Technician

## 2022-05-24 DIAGNOSIS — J4540 Moderate persistent asthma, uncomplicated: Secondary | ICD-10-CM

## 2022-05-24 MED ORDER — OMALIZUMAB 150 MG/ML ~~LOC~~ SOSY: 150.0000 mg | PREFILLED_SYRINGE | SUBCUTANEOUS | 2 refills | Status: DC

## 2022-05-24 NOTE — Telephone Encounter (Signed)
Refill sent for Arvid Right to  [facility] (for [facility])  Dose: 150 mg every 4 weeks  Last OV: 04/12/22 Provider: Dr. Annamaria Boots  Next OV: 04/14/2023  Knox Saliva, PharmD, MPH, BCPS Clinical Pharmacist (Rheumatology and Pulmonology)

## 2022-05-24 NOTE — Telephone Encounter (Signed)
New script:  Patient will need new script for xoliar 150mg . [facility]: [phone] Next scheduled appt: 06/07/22  Thanks Maudie Mercury

## 2022-06-01 ENCOUNTER — Encounter: Payer: Self-pay | Admitting: Cardiology

## 2022-06-01 ENCOUNTER — Ambulatory Visit: Payer: Medicare Other | Attending: Cardiology | Admitting: Cardiology

## 2022-06-01 VITALS — BP 120/78 | HR 62 | Ht 61.0 in | Wt 156.0 lb

## 2022-06-01 DIAGNOSIS — Z01812 Encounter for preprocedural laboratory examination: Secondary | ICD-10-CM | POA: Insufficient documentation

## 2022-06-01 DIAGNOSIS — I491 Atrial premature depolarization: Secondary | ICD-10-CM | POA: Diagnosis present

## 2022-06-01 DIAGNOSIS — I6521 Occlusion and stenosis of right carotid artery: Secondary | ICD-10-CM | POA: Diagnosis present

## 2022-06-01 DIAGNOSIS — E782 Mixed hyperlipidemia: Secondary | ICD-10-CM | POA: Diagnosis present

## 2022-06-01 DIAGNOSIS — I10 Essential (primary) hypertension: Secondary | ICD-10-CM | POA: Insufficient documentation

## 2022-06-01 NOTE — Patient Instructions (Signed)
Medication Instructions:  Your physician recommends that you continue on your current medications as directed. Please refer to the Current Medication list given to you today.  *If you need a refill on your cardiac medications before your next appointment, please call your pharmacy*   Lab Work: Your physician recommends that you return for lab work in: today for a BMP  If you have labs (blood work) drawn today and your tests are completely normal, you will receive your results only by: [place] (if you have MyChart) OR A paper copy in the mail If you have any lab test that is abnormal or we need to change your treatment, we will call you to review the results.   Testing/Procedures: MRA of Neck to check Carotid arteries to be done at [place]: At [facility], you and your health needs are our priority.  As part of our continuing mission to provide you with exceptional heart care, we have created designated Provider Care Teams.  These Care Teams include your primary Cardiologist (physician) and Advanced Practice Providers (APPs -  Physician Assistants and Nurse Practitioners) who all work together to provide you with the care you need, when you need it.  We recommend signing up for the patient portal called "MyChart".  Sign up information is provided on this After Visit Summary.  MyChart is used to connect with patients for Virtual Visits (Telemedicine).  Patients are able to view lab/test results, encounter notes, upcoming appointments, etc.  Non-urgent messages can be sent to your provider as well.   To learn more about what you can do with MyChart, go to [email or web address].    Your next appointment:   6 month(s)  The format for your next appointment:   In Person  Provider:   Shirlee More, MD    Other Instructions Advised to take Protonix which is over the counter Use Maalox or Mylanta 2 tsps hour before bed  Important Information About  S[place]

## 2022-06-01 NOTE — Progress Notes (Signed)
Cardiology Office Note:    Date:  06/01/2022   ID:  Debra Byrd, DOB 14-Sep-1945, MRN [id number]  PCP:  Leane Call, PA-C  Cardiologist:  Norman Herrlich, MD    Referring MD: Leane Call, PA-C    ASSESSMENT:    1. APC (atrial premature contractions)   2. Essential hypertension   3. Mixed hyperlipidemia   4. Stenosis of right carotid artery    PLAN:    In order of problems listed above:  She continues to do quite well and is not having symptomatic APCs and I think in retrospect much of this was precipitated by the severity of her asthma Stable blood pressure is very well controlled and she will continue her ARB Stable well-controlled continue rosuvastatin LDL is at target Follow-up MRA ordered   Next appointment: 1 year   Medication Adjustments/Labs and Tests Ordered: Current medicines are reviewed at length with the patient today.  Concerns regarding medicines are outlined above.  No orders of the defined types were placed in this encounter.  No orders of the defined types were placed in this encounter.   Chief Complaint  Patient presents with   Follow-up  For APCs hypertension hyperlipidemia and right carotid stenosis  History of Present Illness:    Debra Byrd is a 76 y.o. female with a hx of palpitation with atrial premature contractions hypertension hyperlipidemia and mild right internal carotid artery stenosis and mitral regurgitation mild associated lymphedema therapy.    She saw my partner Dr. Bing Matter 12/02/2020 with chest pain high-sensitivity troponin was normal and subsequent Lexiscan Myoview showed EF 73% and normal perfusion low risk test.  Her last carotid MRA performed 03/28/2020 showed less than 50% narrowing of the right internal carotid artery which was stable.  She was last seen 09/01/2021.  Compliance with diet, lifestyle and medications: Yes  Several issues First she was told by the pharmacist that she did have an adverse  consequence of taking Prilosec and clopidogrel I told her this is uncertain significance but she do best to switch to Protonix since they said she discussed with her PCP later today He will has symptoms at night of indigestion and heartburn I told her she would benefit from concentrated antiacid an hour before meal He has no angina palpitation shortness of breath and at this time her asthma is well controlled She tolerates his statin without muscle pain or weakness  Recent labs 02/14/2022 Sodium 138 potassium 4.2 creatinine 0.69 GFR greater than 90 cc Cholesterol 140 triglycerides 94 HDL 57 LDL 61 Hemoglobin 12.6 platelets 246,000 Past Medical History:  Diagnosis Date   Allergic rhinitis, cause unspecified    Asthma    Chronic airway obstruction, not elsewhere classified    Coronary atherosclerosis of unspecified type of vessel, native or graft    COVID-19 09/2020   Mitral valve disorders(424.0)    Mononeuritis of unspecified site    Myalgia and myositis, unspecified    Peripheral vascular disease, unspecified (HCC)    Sleep disturbance, unspecified    Unspecified glaucoma(365.9)     Past Surgical History:  Procedure Laterality Date   ROTATOR CUFF REPAIR      Current Medications: Current Meds  Medication Sig   albuterol (PROAIR HFA) 108 (90 Base) MCG/ACT inhaler Inhale 2 puffs into the lungs every 6 (six) hours as needed for wheezing.   albuterol (PROVENTIL) (2.5 MG/3ML) 0.083% nebulizer solution Take 3 mLs (2.5 mg total) by nebulization 4 (four) times daily as needed for wheezing.  alclomethasone (ACLOVATE) 0.05 % ointment Apply 1 application topically as needed (rash).   Ascorbic Acid (VITAMIN C) 1000 MG tablet Take 1,000 mg by mouth daily.   calcium gluconate 500 MG tablet Take 500 mg by mouth 2 (two) times daily.     cholecalciferol (VITAMIN D) 1000 UNITS tablet Take 3,000 Units by mouth daily.   clarithromycin (BIAXIN) 500 MG tablet 1 twice daily for infection    clindamycin (CLEOCIN) 150 MG capsule Take 150 mg by mouth 4 (four) times daily. Prior to Dental Procedures   clopidogrel (PLAVIX) 75 MG tablet TAKE 1 TABLET BY MOUTH EVERY DAY   Coenzyme Q10 (COQ10) 100 MG CAPS Take 1 capsule by mouth daily.   diclofenac sodium (VOLTAREN) 1 % GEL Apply 4 g topically 4 (four) times daily.   DUREZOL 0.05 % EMUL Apply 1 mL topically as needed (dry skin).   EPINEPHrine 0.3 mg/0.3 mL IJ SOAJ injection Inject 0.3 mg into the muscle as needed for anaphylaxis.   famotidine (PEPCID) 40 MG tablet Take 40 mg by mouth 2 (two) times daily.   finasteride (PROSCAR) 5 MG tablet Take 2.5 mg by mouth daily.   fish oil-omega-3 fatty acids 1000 MG capsule Take 1 g by mouth daily. 4 by mouth daily   fluticasone (FLONASE) 50 MCG/ACT nasal spray Place 2 sprays into both nostrils daily.   fluticasone-salmeterol (ADVAIR) 100-50 MCG/ACT AEPB Inhale 1 puff into the lungs 2 (two) times daily.   gabapentin (NEURONTIN) 600 MG tablet Take 600 mg by mouth at bedtime.   Latanoprost (XELPROS) 0.005 % EMUL Place 1 drop into both eyes daily.   loratadine (CLARITIN) 10 MG tablet Take 1 tablet (10 mg total) by mouth daily.   meclizine (ANTIVERT) 12.5 MG tablet Take 12.5 mg by mouth 3 (three) times daily as needed for dizziness.   methocarbamol (ROBAXIN) 500 MG tablet Take 500 mg by mouth daily.   methocarbamol (ROBAXIN) 750 MG tablet Take 750 mg by mouth every 6 (six) hours as needed for muscle spasms.   Multiple Vitamins-Minerals (ZINC PO) Take 1 tablet by mouth daily. Unknown strength   omalizumab Geoffry Paradise) 150 MG/ML prefilled syringe Inject 150 mg into the skin every 28 (twenty-eight) days.   omeprazole (PRILOSEC) 40 MG capsule Take 40 mg by mouth daily.   potassium chloride (KLOR-CON) 10 MEQ tablet TAKE 1 TABLET BY MOUTH EVERY DAY   predniSONE (DELTASONE) 10 MG tablet 4 X 2 DAYS, 3 X 2 DAYS, 2 X 2 DAYS, 1 X 2 DAYS   pyridOXINE (VITAMIN B-6) 25 MG tablet Take 25 mg by mouth daily.   RESTASIS  0.05 % ophthalmic emulsion Place 1 drop into both eyes 2 (two) times daily.   rosuvastatin (CRESTOR) 5 MG tablet Take 1 tablet (5 mg total) by mouth daily.   telmisartan (MICARDIS) 20 MG tablet Take 1 tablet (20 mg total) by mouth at bedtime.   vitamin B-12 (CYANOCOBALAMIN) 1000 MCG tablet Take 1,000 mcg by mouth daily.     Allergies:   Iodinated contrast media, Azithromycin, Iodine, Levofloxacin, Misc. sulfonamide containing compounds, Montelukast, Montelukast sodium, No known allergies, Cefaclor, Penicillins, Sulfa antibiotics, and Sulfonamide derivatives   Social History   Socioeconomic History   Marital status: Married    Spouse name: Not on file   Number of children: Not on file   Years of education: Not on file   Highest education level: Not on file  Occupational History   Not on file  Tobacco Use   Smoking status: Never  Passive exposure: Never   Smokeless tobacco: Never  Vaping Use   Vaping Use: Never used  Substance and Sexual Activity   Alcohol use: No   Drug use: No   Sexual activity: Not on file  Other Topics Concern   Not on file  Social History Narrative   Not on file   Social Determinants of Health   Financial Resource Strain: Not on file  Food Insecurity: Not on file  Transportation Needs: Not on file  Physical Activity: Not on file  Stress: Not on file  Social Connections: Not on file     Family History: The patient's family history includes Emphysema in her maternal uncle; Heart disease in her father. ROS:   Please see the history of present illness.    All other systems reviewed and are negative.  EKGs/Labs/Other Studies Reviewed:    The following studies were reviewed today:   Recent Labs: No results found for requested labs within last 365 days.  Recent Lipid Panel    Component Value Date/Time   CHOL 145 08/08/2018 0940   TRIG 85 08/08/2018 0940   HDL 67 08/08/2018 0940   CHOLHDL 2.2 08/08/2018 0940   LDLCALC 61 08/08/2018 0940     Physical Exam:    VS:  BP 120/78 (BP Location: Left Arm, Patient Position: Sitting, Cuff Size: Normal)   Pulse 62   Ht 5\' 1"  (1.549 m)   Wt 156 lb (70.8 kg)   SpO2 95%   BMI 29.48 kg/m     Wt Readings from Last 3 Encounters:  06/01/22 156 lb (70.8 kg)  05/10/22 155 lb 12.8 oz (70.7 kg)  04/12/22 155 lb 12.8 oz (70.7 kg)     GEN:  Well nourished, well developed in no acute distress HEENT: Normal NECK: No JVD; No carotid bruits LYMPHATICS: No lymphadenopathy CARDIAC: RRR, no murmurs, rubs, gallops RESPIRATORY:  Clear to auscultation without rales, wheezing or rhonchi  ABDOMEN: Soft, non-tender, non-distended MUSCULOSKELETAL:  No edema; No deformity  SKIN: Warm and dry NEUROLOGIC:  Alert and oriented x 3 PSYCHIATRIC:  Normal affect    Signed, Shirlee More, MD  06/01/2022 1:24 PM    [facility]

## 2022-06-02 ENCOUNTER — Ambulatory Visit: Payer: Medicare Other | Admitting: Podiatry

## 2022-06-02 ENCOUNTER — Ambulatory Visit (INDEPENDENT_AMBULATORY_CARE_PROVIDER_SITE_OTHER): Payer: Medicare Other | Admitting: Podiatry

## 2022-06-02 DIAGNOSIS — B351 Tinea unguium: Secondary | ICD-10-CM | POA: Diagnosis not present

## 2022-06-02 DIAGNOSIS — M79674 Pain in right toe(s): Secondary | ICD-10-CM

## 2022-06-02 DIAGNOSIS — D689 Coagulation defect, unspecified: Secondary | ICD-10-CM | POA: Diagnosis not present

## 2022-06-02 DIAGNOSIS — M79675 Pain in left toe(s): Secondary | ICD-10-CM

## 2022-06-02 DIAGNOSIS — L84 Corns and callosities: Secondary | ICD-10-CM | POA: Diagnosis not present

## 2022-06-02 DIAGNOSIS — Q828 Other specified congenital malformations of skin: Secondary | ICD-10-CM

## 2022-06-02 LAB — BASIC METABOLIC PANEL
BUN/Creatinine Ratio: 19 (ref 12–28)
BUN: 16 mg/dL (ref 8–27)
CO2: 23 mmol/L (ref 20–29)
Calcium: 8.5 mg/dL — ABNORMAL LOW (ref 8.7–10.3)
Chloride: 103 mmol/L (ref 96–106)
Creatinine, Ser: 0.83 mg/dL (ref 0.57–1.00)
Glucose: 93 mg/dL (ref 70–99)
Potassium: 4.3 mmol/L (ref 3.5–5.2)
Sodium: 140 mmol/L (ref 134–144)
eGFR: 73 mL/min/{1.73_m2} (ref >59)

## 2022-06-02 NOTE — Progress Notes (Signed)
  Subjective:  Patient ID: Debra Byrd, female    DOB: 1946/04/08,  MRN: [id number]  Debra Byrd presents to clinic today for:  Chief Complaint  Patient presents with   Nail Problem    Routine foot care(Blood thinner) PCP-[facility] PCP VST-06/01/2022   New problem(s): None.   Patient states she purchased a new pair of [employer] sneakers. She is pleased with them.  PCP is [facility], Alphonzo Dublin, PA-C.  Allergies  Allergen Reactions   Iodinated Contrast Media Shortness Of Breath and Swelling    Swelling in facial/No issues with shellfish    Azithromycin Hives    No problem with Biaxin   Iodine Swelling   Levofloxacin     Tendon tears   Misc. Sulfonamide Containing Compounds    Montelukast Other (See Comments)   Montelukast Sodium Swelling   No Known Allergies Other (See Comments)   Cefaclor Rash   Penicillins Swelling   Sulfa Antibiotics Rash   Sulfonamide Derivatives Rash    Review of Systems: Negative except as noted in the HPI.  Objective:   Debra Byrd is a pleasant 76 y.o. female WD, WN in NAD. AAO x 3. Vascular Examination: Vascular status intact b/l with palpable pedal pulses. Pedal hair present b/l. CFT immediate b/l. No edema. No pain with calf compression b/l. Skin temperature gradient WNL b/l.   Neurological Examination: Sensation grossly intact b/l with 10 gram monofilament. Vibratory sensation intact b/l.   Dermatological Examination: Pedal skin with normal turgor, texture and tone b/l. Toenails 1-5 b/l thick, discolored, elongated with subungual debris and pain on dorsal palpation.  Hyperkeratotic lesion(s) medial IPJ b/l great toes. No erythema, no edema, no drainage, no fluctuance.  Musculoskeletal Examination: Muscle strength 5/5 to b/l LE. Bunion deformity b/l. Patient ambulates independent of any assistive aids.  Radiographs: None  Assessment/Plan: 1. Pain due to onychomycosis of toenails of both feet   2. Callus   3. Coagulation defect ([place])      No orders of the defined types were placed in this encounter.   -Patient was evaluated and treated. All patient's and/or POA's questions/concerns answered on today's visit. -Consent given for treatment as described below: -Patient to continue soft, supportive shoe gear daily. -Mycotic toenails 1-5 bilaterally were debrided in length and girth with sterile nail nippers and dremel without incident. -Callus(es) bilateral great toes pared utilizing sterile scalpel blade without complication or incident. Total number debrided =2. -Patient/POA to call should there be question/concern in the interim.   Return in about 3 months (around 09/02/2022).  Marzetta Board, DPM

## 2022-06-05 ENCOUNTER — Encounter: Payer: Self-pay | Admitting: Podiatry

## 2022-06-07 ENCOUNTER — Telehealth: Payer: Self-pay

## 2022-06-07 ENCOUNTER — Ambulatory Visit (INDEPENDENT_AMBULATORY_CARE_PROVIDER_SITE_OTHER): Payer: Medicare Other

## 2022-06-07 VITALS — BP 138/78 | HR 56 | Temp 98.1°F | Resp 16 | Ht 61.0 in | Wt 155.8 lb

## 2022-06-07 DIAGNOSIS — J4540 Moderate persistent asthma, uncomplicated: Secondary | ICD-10-CM

## 2022-06-07 MED ORDER — OMALIZUMAB 150 MG/ML ~~LOC~~ SOSY
150.0000 mg | PREFILLED_SYRINGE | Freq: Once | SUBCUTANEOUS | Status: AC
  Administered 2022-06-07: 150 mg via SUBCUTANEOUS
  Filled 2022-06-07: qty 1

## 2022-06-07 NOTE — Telephone Encounter (Signed)
-----   Message from Richardo Priest, MD sent at 06/02/2022  7:58 AM EDT ----- Normal or stable result

## 2022-06-07 NOTE — Telephone Encounter (Signed)
Patient notified of results.

## 2022-06-07 NOTE — Progress Notes (Signed)
Diagnosis: Asthma  Provider:  Marshell Garfinkel MD  Procedure: Injection  Xolair (Omalizumab), Dose: 150 mg, Site: subcutaneous, Number of injections: 1  Post Care: Patient declined observation  Discharge: Condition: Good, Destination: Home . AVS provided to patient.   Performed by:  Adelina Mings, LPN

## 2022-06-11 ENCOUNTER — Ambulatory Visit (HOSPITAL_BASED_OUTPATIENT_CLINIC_OR_DEPARTMENT_OTHER)
Admission: RE | Admit: 2022-06-11 | Discharge: 2022-06-11 | Disposition: A | Discharge status: Home / Self Care | Payer: Medicare Other | Source: Ambulatory Visit | Attending: Cardiology | Admitting: Cardiology

## 2022-06-11 DIAGNOSIS — I6521 Occlusion and stenosis of right carotid artery: Secondary | ICD-10-CM | POA: Insufficient documentation

## 2022-06-11 MED ORDER — GADOBUTROL 1 MMOL/ML IV SOLN
7.0000 mL | Freq: Once | INTRAVENOUS | Status: AC | PRN
  Administered 2022-06-11: 7 mL via INTRAVENOUS

## 2022-06-14 ENCOUNTER — Telehealth: Payer: Self-pay | Admitting: Cardiology

## 2022-06-14 NOTE — Telephone Encounter (Signed)
Returned call to patient.  Discussed results of MRI.  Per Dr. Bettina Gavia: This is a good result no progression 50% stenosis right carotid artery stable since 2018  Patient verbalized understanding and expressed appreciation for call.

## 2022-06-14 NOTE — Telephone Encounter (Signed)
Pt is returning call and is requesting call back in regards to results.  

## 2022-07-05 ENCOUNTER — Ambulatory Visit (INDEPENDENT_AMBULATORY_CARE_PROVIDER_SITE_OTHER): Payer: Medicare Other

## 2022-07-05 VITALS — BP 149/85 | HR 55 | Temp 98.4°F | Resp 20 | Ht 61.0 in | Wt 157.2 lb

## 2022-07-05 DIAGNOSIS — J4540 Moderate persistent asthma, uncomplicated: Secondary | ICD-10-CM | POA: Diagnosis not present

## 2022-07-05 MED ORDER — OMALIZUMAB 150 MG/ML ~~LOC~~ SOSY
150.0000 mg | PREFILLED_SYRINGE | Freq: Once | SUBCUTANEOUS | Status: AC
  Administered 2022-07-05: 150 mg via SUBCUTANEOUS
  Filled 2022-07-05: qty 1

## 2022-07-05 NOTE — Progress Notes (Signed)
Diagnosis: Asthma  Provider:  Chilton Greathouse MD  Procedure: Injection  Xolair (Omalizumab), Dose: 150 mg, Site: subcutaneous, Number of injections: 1   Discharge: Condition: Good, Destination: Home . AVS provided to patient.   Performed by:  Nat Math, RN

## 2022-07-06 ENCOUNTER — Telehealth: Payer: Self-pay

## 2022-07-06 NOTE — Telephone Encounter (Signed)
Received notification from infusion center requesting outreach to PAP patients to transition to self-admin. Patient currently receives Xolair at infusion center through [facility] P[facility]. Next dose is 08/02/2022.   Spoke with patient's daughter, Dennard Nip, about if the patient or a family member is open to transitioning to self-administration. Daughter indicated that this is not an option and became emotional throughout the conversation. Told Cheri that we would reach out to infusion center and inform them of this conversation.   Georgeann Oppenheim [employer] [facility] [employer] PharmD Candidate [phone]

## 2022-07-06 NOTE — Telephone Encounter (Signed)
I also spoke with patient's daughter and advised of shift towards self-administration. She did mention that her mother had previously self-administered an allergy shot before Xolair and had a reaction. However I did advise that because she has been on Xolair for many years, a reaction is not expected since she would simply be changing to self-administration sites.   Daughter stated they would be willing to pay for price of Xolair if no longer able to receive through patient assistnace, but I advised that patient's insurance has denied the medication for in-office administration due to pushing everyone towards self-administration. Advised that cash price is several thousand dollars and that we would not make her do this. She also requested if anyone at [facility] could do injections for her however I advised that our office no longer completes injections in clinic. Advised that previous LPN is no longer completing injections.  Message has been sent to infusion center advising of this conversation and that patient is absolutely unwilling and unable to self-administer. Closing encounter for now.  Chesley Mires, PharmD, MPH, BCPS, CPP Clinical Pharmacist (Rheumatology and Pulmonology)

## 2022-07-12 NOTE — Telephone Encounter (Signed)
Returned call to patient about Xolair. Advised that she can continue to receive at infusion center but we are reviewing patients that can be transitioned to self-admin. Advised that we are not going to make her pay cash price and/or come off of Xolair as this is not appropriate treatment.  She has completed the [facility] renewal call.  Chesley Mires, PharmD, MPH, BCPS, CPP Clinical Pharmacist (Rheumatology and Pulmonology)

## 2022-07-21 ENCOUNTER — Encounter: Payer: Self-pay | Admitting: Internal Medicine

## 2022-07-21 ENCOUNTER — Other Ambulatory Visit: Payer: Self-pay | Admitting: Cardiology

## 2022-07-21 ENCOUNTER — Other Ambulatory Visit (HOSPITAL_COMMUNITY): Payer: Self-pay

## 2022-07-22 ENCOUNTER — Telehealth: Payer: Self-pay

## 2022-07-22 NOTE — Telephone Encounter (Signed)
     Primary Cardiologist: Norman Herrlich, MD  Chart reviewed as part of pre-operative protocol coverage. Given past medical history and time since last visit, based on ACC/AHA guidelines, Debra Byrd would be at acceptable risk for the planned procedure without further cardiovascular testing.   Her Plavix may be held for 5 days prior to her procedure.  Please resume as soon as hemostasis is achieved.  I will route this recommendation to the requesting party via Epic fax function and remove from pre-op pool.  Please call with questions.  Thomasene Ripple. Campbell Kray NP-C     07/22/2022, 9:45 AM [facility] [address] Office [phone] Fax [phone]

## 2022-07-22 NOTE — Telephone Encounter (Signed)
   Pre-operative Risk Assessment    Patient Name: Debra Byrd  DOB: 10/08/1945 MRN: [id number]      Request for Surgical Clearance    Procedure:   COLONOSCOPY, EDG  Date of Surgery:  Clearance TBD                                 Surgeon:  DR. Reed Breech  Surgeon's Group or Practice Name:  [facility] Phone number:  [phone] Fax number:  [phone]   Type of Clearance Requested:   - Pharmacy:  Hold Clopidogrel (Plavix) PLAVIX   Type of Anesthesia:  Not Indicated   Additional requests/questions:  Please advise surgeon/provider what medications should be held. Please fax completed request form back to surgeon ( form in chart media)  Signed, Viviano Simas   07/22/2022, 7:37 AM

## 2022-07-25 ENCOUNTER — Other Ambulatory Visit (HOSPITAL_COMMUNITY): Payer: Self-pay

## 2022-08-02 ENCOUNTER — Ambulatory Visit (INDEPENDENT_AMBULATORY_CARE_PROVIDER_SITE_OTHER): Payer: Medicare Other | Admitting: *Deleted

## 2022-08-02 VITALS — BP 143/83 | HR 56 | Temp 97.8°F | Resp 16 | Ht 61.0 in | Wt 159.8 lb

## 2022-08-02 DIAGNOSIS — J4540 Moderate persistent asthma, uncomplicated: Secondary | ICD-10-CM | POA: Diagnosis not present

## 2022-08-02 MED ORDER — OMALIZUMAB 150 MG/ML ~~LOC~~ SOSY
150.0000 mg | PREFILLED_SYRINGE | Freq: Once | SUBCUTANEOUS | Status: AC
  Administered 2022-08-02: 150 mg via SUBCUTANEOUS

## 2022-08-02 NOTE — Progress Notes (Signed)
Diagnosis: Asthma  Provider:  Praveen Mannam MD  Procedure: Injection  Xolair (Omalizumab), Dose: 150 mg, Site: subcutaneous, Number of injections: 1  Post Care: Observation period completed  Discharge: Condition: Good, Destination: Home . AVS provided to patient.   Performed by:  Zuhair Lariccia A, RN       

## 2022-08-12 ENCOUNTER — Ambulatory Visit (INDEPENDENT_AMBULATORY_CARE_PROVIDER_SITE_OTHER): Payer: Medicare Other | Admitting: Podiatry

## 2022-08-12 ENCOUNTER — Ambulatory Visit (INDEPENDENT_AMBULATORY_CARE_PROVIDER_SITE_OTHER): Payer: Medicare Other

## 2022-08-12 ENCOUNTER — Telehealth: Payer: Self-pay | Admitting: Podiatry

## 2022-08-12 DIAGNOSIS — S92514A Nondisplaced fracture of proximal phalanx of right lesser toe(s), initial encounter for closed fracture: Secondary | ICD-10-CM

## 2022-08-12 DIAGNOSIS — M778 Other enthesopathies, not elsewhere classified: Secondary | ICD-10-CM

## 2022-08-12 NOTE — Progress Notes (Unsigned)
Subjective:  Patient ID: Debra Byrd, female    DOB: 1945/10/13,  MRN: [id number]  Chief Complaint  Patient presents with   Foot Pain    Broken right pinky toe    76 y.o. female presents to the office today with concern for injury to her right fifth toe.  She says that earlier this morning she stubbed her fifth toe on the leg of a piece of furniture.  She had the toe bent outwards significantly.  She had significant pain in the fifth toe and some swelling.  Says that has gotten slightly better since the injury.  Says she has full sensation in the toe.  Past Medical History:  Diagnosis Date   Allergic rhinitis, cause unspecified    Asthma    Chronic airway obstruction, not elsewhere classified    Coronary atherosclerosis of unspecified type of vessel, native or graft    COVID-19 09/2020   Mitral valve disorders(424.0)    Mononeuritis of unspecified site    Myalgia and myositis, unspecified    Peripheral vascular disease, unspecified (HCC)    Sleep disturbance, unspecified    Unspecified glaucoma(365.9)     Allergies  Allergen Reactions   Iodinated Contrast Media Shortness Of Breath and Swelling    Swelling in facial/No issues with shellfish    Azithromycin Hives    No problem with Biaxin   Iodine Swelling   Levofloxacin     Tendon tears   Misc. Sulfonamide Containing Compounds    Montelukast Other (See Comments)   Montelukast Sodium Swelling   No Known Allergies Other (See Comments)   Cefaclor Rash   Penicillins Swelling   Sulfa Antibiotics Rash   Sulfonamide Derivatives Rash    ROS: Negative except as per HPI above  Objective:  General: AAO x3, NAD  Dermatological: With inspection and palpation of the right and left lower extremities there are no open sores, no preulcerative lesions, no rash or signs of infection present. Nails are of normal length thickness and coloration.   Vascular:  Dorsalis Pedis artery and Posterior Tibial artery pedal pulses are 2/4  bilateral.  Capillary fill time < 3 sec to all digits.   Neruologic: Grossly intact via light touch bilateral. Protective threshold intact to all sites bilateral.   Musculoskeletal: Mild edema noted to the right fifth toe.  There is pain on palpation of the proximal phalanx.  No gross deformity of the toe is noted it is in rectus alignment.  Gait: Antalgic No images are attached to the encounter.  Radiographs:  Date: 08/12/2022 XR the right foot Weightbearing AP/Lateral/Oblique   Findings: Attention directed to the base of the proximal phalanx of the right fifth toe there is no to be a transverse radiolucency consistent with a transverse fracture with 1 to 2 mm displacement of the distal fragment laterally. Assessment:   1. Closed nondisplaced fracture of proximal phalanx of lesser toe of right foot, initial encounter   2. Capsulitis of foot      Plan:  Patient was evaluated and treated and all questions answered.  # Fracture of proximal phalanx of the right fifth toe transverse at the base of the phalanx with minimal displacement -Discussed that the patient did sustain a fracture to her right fifth toe earlier today -Fortunately this is minimally displaced and will heal well with nonoperative management. -Will proceed with 4 weeks weightbearing as tolerated in a Darco shoe to the right foot. -After 4 weeks she can see how the foot is feeling and  if it is not hurting her at all to walk without the Darco shoe she can transition back to a regular tennis shoe. -Also demonstrated buddy splinting of the fifth toe to the fourth toe with a piece of gauze and Coban.  Patient will do this if she feels that is helpful for pain control. -Ice elevation and ibuprofen as needed for pain control.  Return in about 6 weeks (around 09/23/2022) for Follow-up right fifth toe fracture.          Corinna Gab, DPM [facility]

## 2022-08-12 NOTE — Telephone Encounter (Signed)
Patient called back wants to know is it ok for her to drive with her broken toe?

## 2022-08-16 NOTE — Telephone Encounter (Signed)
Called patient and let her know that it was ok for her to drive. She thanked [place] for letting her know.

## 2022-08-18 ENCOUNTER — Other Ambulatory Visit: Payer: Self-pay | Admitting: Cardiology

## 2022-08-18 NOTE — Telephone Encounter (Signed)
Rx refill sent to pharmacy. 

## 2022-08-24 ENCOUNTER — Ambulatory Visit: Payer: Medicare Other | Admitting: Nurse Practitioner

## 2022-08-26 DIAGNOSIS — N3281 Overactive bladder: Secondary | ICD-10-CM

## 2022-08-26 DIAGNOSIS — N3941 Urge incontinence: Secondary | ICD-10-CM | POA: Insufficient documentation

## 2022-08-26 DIAGNOSIS — N952 Postmenopausal atrophic vaginitis: Secondary | ICD-10-CM

## 2022-08-26 HISTORY — DX: Postmenopausal atrophic vaginitis: N95.2

## 2022-08-26 HISTORY — DX: Urge incontinence: N39.41

## 2022-08-26 HISTORY — DX: Overactive bladder: N32.81

## 2022-08-30 ENCOUNTER — Encounter: Payer: Self-pay | Admitting: Nurse Practitioner

## 2022-08-30 ENCOUNTER — Ambulatory Visit: Payer: Medicare Other

## 2022-08-30 ENCOUNTER — Ambulatory Visit (INDEPENDENT_AMBULATORY_CARE_PROVIDER_SITE_OTHER): Payer: Medicare Other | Admitting: Nurse Practitioner

## 2022-08-30 ENCOUNTER — Ambulatory Visit (INDEPENDENT_AMBULATORY_CARE_PROVIDER_SITE_OTHER): Payer: Medicare Other

## 2022-08-30 VITALS — BP 128/74 | HR 67 | Ht 61.0 in | Wt 159.7 lb

## 2022-08-30 DIAGNOSIS — J302 Other seasonal allergic rhinitis: Secondary | ICD-10-CM | POA: Diagnosis not present

## 2022-08-30 DIAGNOSIS — J4541 Moderate persistent asthma with (acute) exacerbation: Secondary | ICD-10-CM | POA: Diagnosis not present

## 2022-08-30 DIAGNOSIS — K219 Gastro-esophageal reflux disease without esophagitis: Secondary | ICD-10-CM | POA: Diagnosis not present

## 2022-08-30 DIAGNOSIS — J3089 Other allergic rhinitis: Secondary | ICD-10-CM | POA: Diagnosis not present

## 2022-08-30 LAB — CBC WITH DIFFERENTIAL/PLATELET
Basophils Absolute: 0.1 10*3/uL (ref 0.0–0.1)
Basophils Relative: 0.7 % (ref 0.0–3.0)
Eosinophils Absolute: 0.6 10*3/uL (ref 0.0–0.7)
Eosinophils Relative: 7.2 % — ABNORMAL HIGH (ref 0.0–5.0)
HCT: 39.8 % (ref 36.0–46.0)
Hemoglobin: 13 g/dL (ref 12.0–15.0)
Lymphocytes Relative: 29.3 % (ref 12.0–46.0)
Lymphs Abs: 2.3 10*3/uL (ref 0.7–4.0)
MCHC: 32.7 g/dL (ref 30.0–36.0)
MCV: 92.7 fl (ref 78.0–100.0)
Monocytes Absolute: 0.6 10*3/uL (ref 0.1–1.0)
Monocytes Relative: 7.8 % (ref 3.0–12.0)
Neutro Abs: 4.4 10*3/uL (ref 1.4–7.7)
Neutrophils Relative %: 55 % (ref 43.0–77.0)
Platelets: 306 10*3/uL (ref 150.0–400.0)
RBC: 4.3 Mil/uL (ref 3.87–5.11)
RDW: 14 % (ref 11.5–15.5)
WBC: 8 10*3/uL (ref 4.0–10.5)

## 2022-08-30 LAB — POCT EXHALED NITRIC OXIDE: FeNO level (ppb): 48

## 2022-08-30 MED ORDER — BENZONATATE 200 MG PO CAPS: 200.0000 mg | ORAL_CAPSULE | Freq: Three times a day (TID) | ORAL | 1 refills | Status: DC | PRN

## 2022-08-30 MED ORDER — METHYLPREDNISOLONE ACETATE 80 MG/ML IJ SUSP
80.0000 mg | Freq: Once | INTRAMUSCULAR | Status: AC
  Administered 2022-08-30: 80 mg via INTRAMUSCULAR

## 2022-08-30 MED ORDER — FLUTICASONE-SALMETEROL 250-50 MCG/ACT IN AEPB: 1.0000 | INHALATION_SPRAY | Freq: Two times a day (BID) | RESPIRATORY_TRACT | 5 refills | Status: DC

## 2022-08-30 MED ORDER — DOXYCYCLINE HYCLATE 100 MG PO TABS: 100.0000 mg | ORAL_TABLET | Freq: Two times a day (BID) | ORAL | 0 refills | Status: DC

## 2022-08-30 MED ORDER — PREDNISONE 20 MG PO TABS: 40.0000 mg | ORAL_TABLET | Freq: Every day | ORAL | 0 refills | Status: AC

## 2022-08-30 NOTE — Assessment & Plan Note (Signed)
Well-controlled on current regimen. ?

## 2022-08-30 NOTE — Patient Instructions (Addendum)
Increase Advair to 250 mcg dose. 1 puff Twice daily. Brush tongue and rinse mouth  Continue Albuterol inhaler 2 puffs or 3 mL neb every 6 hours as needed for shortness of breath or wheezing. Notify if symptoms persist despite rescue inhaler/neb use. Continue famotidine 40 mg Twice daily  Continue flonase nasal spray 2 sprays each nostril daily Continue loratadine 1 tab daily Continue Xolair injections as scheduled; postpone today's injection a few days to make sure you are feeling better Continue omeprazole 40 mg Twice daily for reflux  Prednisone 40 mg daily for 5 days. Take in AM with food Doxycycline 1 tab Twice daily for 7 days. Take with food. Wear sunscreen when outside; medication increases risk for sunburns Benzonatate 1 capsule Three times a day for cough. Use consistently over the next 3-4 days Delsym cough syrup 2 tsp Twice daily over the counter for cough  Chest x ray and labs today   Follow up in 2 weeks with Dr. Annamaria Boots or Alanson Aly. If symptoms do not improve or worsen, please contact office for sooner follow up or seek emergency care.

## 2022-08-30 NOTE — Assessment & Plan Note (Signed)
She is on twice daily PPI therapy and famotidine for breakthrough. Awaiting EGD for further evaluation.

## 2022-08-30 NOTE — Assessment & Plan Note (Signed)
Acute exacerbation of asthmatic bronchitis with elevated exhaled nitric oxide. GERD likely exacerbating. We will treat her with depo inj 80 mg x 1, prednisone burst, and empiric doxycycline. Step up Advair to 250 mcg dose. Target cough control measures. CBC with diff  to assess eosinophils. May need to consider change in biologic therapy if she has any further flares or cough remains poorly controlled. Asthma action plan in place. She is on multiple regimens for GERD and understands reduction strategies; awaiting EGD for further evaluation.   Patient Instructions  Increase Advair to 250 mcg dose. 1 puff Twice daily. Brush tongue and rinse mouth  Continue Albuterol inhaler 2 puffs or 3 mL neb every 6 hours as needed for shortness of breath or wheezing. Notify if symptoms persist despite rescue inhaler/neb use. Continue famotidine 40 mg Twice daily  Continue flonase nasal spray 2 sprays each nostril daily Continue loratadine 1 tab daily Continue Xolair injections as scheduled; postpone today's injection a few days to make sure you are feeling better Continue omeprazole 40 mg Twice daily for reflux  Prednisone 40 mg daily for 5 days. Take in AM with food Doxycycline 1 tab Twice daily for 7 days. Take with food. Wear sunscreen when outside; medication increases risk for sunburns Benzonatate 1 capsule Three times a day for cough. Use consistently over the next 3-4 days Delsym cough syrup 2 tsp Twice daily over the counter for cough  Chest x ray and labs today   Follow up in 2 weeks with Dr. Annamaria Boots or Alanson Aly. If symptoms do not improve or worsen, please contact office for sooner follow up or seek emergency care.

## 2022-08-30 NOTE — Progress Notes (Signed)
@Patient  ID: , female    DOB: Nov 29, 1945, 77 y.o.   MRN: [id number]  Chief Complaint  Patient presents with   Follow-up    Pt f/u for asthma, she is schedule for colonoscopy/endo on 2/15. She is having an ongoing cough in the morning and at raspy cough at night. She wants to be sure that her cough is reflux related and not pulmonary. Asthma seems under control.    Referring provider: 3/15, PA-C  HPI: 77 year old female, never smoker followed for asthma on Xolair. She is a patient of Dr. 61 and last seen in office 04/12/2022. Past medical history significant for allergic rhinitis, peripheral neuropathy, glaucoma, GERD, mitral valve disease, fibromyalgia, HLD.   TEST/EVENTS:  2017 spirometry: FVC  83, FEV1 90, ratio 80 02/2021: eos 500   04/12/2022: OV with Dr. 04/14/2022.  Chronic obstructive asthma on Xolair and often steroid-dependent.  Currently managed on Advair 100 and biologic therapy.  Feels like Xolair has made a huge difference in her quality of life with dramatic reduction in incidence of asthma exacerbation, systemic steroids and hospitalization.  Unable to give her own injections due to poor eyesight and other reasons.  Feels like her increased cough comes around the time of her next Xolair injection.  Currently doing well.  Asks for standby prescription for prednisone and Biaxin to hold in case of bronchitis in the coming winter months.  08/30/2022: Today-surgical clearance Patient presents today for surgical clearance prior to colonoscopy and endoscopy scheduled for February 15.  She tells me that she was doing well with her asthma up until around Christmas time.  She started having increased cough, especially at night.  Thought that it was related to her reflux but has not noticed a huge change.  She takes omeprazole twice daily, Pepcid twice daily and sleeps with her head of bed elevated.  She also avoids eating anywhere from 2 to 4 hours before bed.   Describes the cough as mostly nonproductive but congested.  She will occasionally produce some yellow sputum in the morning.  Breathing overall feels stable.  Does have some increased chest tightness.  Has not noticed any significant wheezing.  Denies any fever, chills, hemoptysis, difficulties swallowing, N/V. She is on Advair 100 and Xolair, due for injection today.   FeNO 48 ppb  Allergies  Allergen Reactions   Iodinated Contrast Media Shortness Of Breath and Swelling    Swelling in facial/No issues with shellfish    Azithromycin Hives    No problem with Biaxin   Iodine Swelling   Levofloxacin     Tendon tears   Misc. Sulfonamide Containing Compounds    Montelukast Other (See Comments)   Montelukast Sodium Swelling   No Known Allergies Other (See Comments)   Cefaclor Rash   Penicillins Swelling   Sulfa Antibiotics Rash   Sulfonamide Derivatives Rash    Immunization History  Administered Date(s) Administered   Fluad Quad(high Dose 65+) 06/06/2019, 06/10/2020, 05/27/2021   Influenza Split 05/30/2011, 05/31/2012, 06/16/2014, 05/25/2015   Influenza Whole 05/21/2008, 06/07/2010   Influenza, High Dose Seasonal PF 05/26/2016, 06/13/2017, 06/14/2018   Influenza,inj,Quad PF,6+ Mos 06/17/2013   PFIZER(Purple Top)SARS-COV-2 Vaccination 08/24/2019, 09/28/2019   Pneumococcal Conjugate-13 07/10/2013   Pneumococcal Polysaccharide-23 08/22/1998   Tdap 02/28/2011   Zoster, Live 05/09/2016    Past Medical History:  Diagnosis Date   Allergic rhinitis, cause unspecified    Asthma    Chronic airway obstruction, not elsewhere classified  Coronary atherosclerosis of unspecified type of vessel, native or graft    COVID-19 09/2020   Mitral valve disorders(424.0)    Mononeuritis of unspecified site    Myalgia and myositis, unspecified    Peripheral vascular disease, unspecified (HCC)    Sleep disturbance, unspecified    Unspecified glaucoma(365.9)     Tobacco History: Social History    Tobacco Use  Smoking Status Never   Passive exposure: Never  Smokeless Tobacco Never   Counseling given: Not Answered   Outpatient Medications Prior to Visit  Medication Sig Dispense Refill   albuterol (PROAIR HFA) 108 (90 Base) MCG/ACT inhaler Inhale 2 puffs into the lungs every 6 (six) hours as needed for wheezing. 56 g 4   albuterol (PROVENTIL) (2.5 MG/3ML) 0.083% nebulizer solution Take 3 mLs (2.5 mg total) by nebulization 4 (four) times daily as needed for wheezing. 360 mL 12   alclomethasone (ACLOVATE) 0.05 % ointment Apply 1 application topically as needed (rash).     Ascorbic Acid (VITAMIN C) 1000 MG tablet Take 1,000 mg by mouth daily.     calcium gluconate 500 MG tablet Take 500 mg by mouth 2 (two) times daily.       cholecalciferol (VITAMIN D) 1000 UNITS tablet Take 3,000 Units by mouth daily.     clindamycin (CLEOCIN) 150 MG capsule Take 150 mg by mouth 4 (four) times daily. Prior to Dental Procedures     clopidogrel (PLAVIX) 75 MG tablet Take 1 tablet (75 mg total) by mouth daily. 90 tablet 2   Coenzyme Q10 (COQ10) 100 MG CAPS Take 1 capsule by mouth daily.     diclofenac sodium (VOLTAREN) 1 % GEL Apply 4 g topically 4 (four) times daily. 100 g 1   DUREZOL 0.05 % EMUL Apply 1 mL topically as needed (dry skin).     EPINEPHrine 0.3 mg/0.3 mL IJ SOAJ injection Inject 0.3 mg into the muscle as needed for anaphylaxis. 6 each 4   famotidine (PEPCID) 40 MG tablet Take 40 mg by mouth 2 (two) times daily.     finasteride (PROSCAR) 5 MG tablet Take 2.5 mg by mouth daily.     fish oil-omega-3 fatty acids 1000 MG capsule Take 1 g by mouth daily. 4 by mouth daily     fluticasone (FLONASE) 50 MCG/ACT nasal spray Place 2 sprays into both nostrils daily. 48 g 4   gabapentin (NEURONTIN) 600 MG tablet Take 600 mg by mouth at bedtime.     Latanoprost (XELPROS) 0.005 % EMUL Place 1 drop into both eyes daily.     loratadine (CLARITIN) 10 MG tablet Take 1 tablet (10 mg total) by mouth daily.  90 tablet 4   meclizine (ANTIVERT) 12.5 MG tablet Take 12.5 mg by mouth 3 (three) times daily as needed for dizziness.     methocarbamol (ROBAXIN) 500 MG tablet Take 500 mg by mouth daily.     methocarbamol (ROBAXIN) 750 MG tablet Take 750 mg by mouth every 6 (six) hours as needed for muscle spasms.     Multiple Vitamins-Minerals (ZINC PO) Take 1 tablet by mouth daily. Unknown strength     omalizumab Arvid Right) 150 MG/ML prefilled syringe Inject 150 mg into the skin every 28 (twenty-eight) days. 3 mL 2   omeprazole (PRILOSEC) 40 MG capsule Take 40 mg by mouth in the morning and at bedtime.     potassium chloride (KLOR-CON) 10 MEQ tablet TAKE 1 TABLET BY MOUTH EVERY DAY 90 tablet 3   pyridOXINE (VITAMIN B-6) 25  MG tablet Take 25 mg by mouth daily.     RESTASIS 0.05 % ophthalmic emulsion Place 1 drop into both eyes 2 (two) times daily.     rosuvastatin (CRESTOR) 5 MG tablet Take 1 tablet (5 mg total) by mouth daily. 90 tablet 2   telmisartan (MICARDIS) 20 MG tablet Take 1 tablet (20 mg total) by mouth at bedtime. 90 tablet 1   vitamin B-12 (CYANOCOBALAMIN) 1000 MCG tablet Take 1,000 mcg by mouth daily.     clarithromycin (BIAXIN) 500 MG tablet 1 twice daily for infection 14 tablet 1   fluticasone-salmeterol (ADVAIR) 100-50 MCG/ACT AEPB Inhale 1 puff into the lungs 2 (two) times daily. 180 each 4   predniSONE (DELTASONE) 10 MG tablet 4 X 2 DAYS, 3 X 2 DAYS, 2 X 2 DAYS, 1 X 2 DAYS 20 tablet 0   No facility-administered medications prior to visit.     Review of Systems:   Constitutional: No weight loss or gain, night sweats, fevers, chills, fatigue, or lassitude. HEENT: No headaches, difficulty swallowing, tooth/dental problems, or sore throat. No sneezing, itching, ear ache, nasal congestion, or post nasal drip CV:  No chest pain, orthopnea, PND, swelling in lower extremities, anasarca, dizziness, palpitations, syncope Resp: +chest tightness; increased cough. No shortness of breath with exertion  or at rest. No hemoptysis. No wheezing.  No chest wall deformity GI:  No heartburn, indigestion, abdominal pain, loss of appetite GU: No dysuria, change in color of urine, urgency or frequency.  Skin: No rash, lesions, ulcerations MSK:  No joint pain or swelling.   Neuro: No dizziness or lightheadedness.  Psych: No depression or anxiety. Mood stable.     Physical Exam:  BP 128/74   Pulse 67   Ht 5\' 1"  (1.549 m)   Wt 159 lb 11.2 oz (72.4 kg)   SpO2 98%   BMI 30.18 kg/m   GEN: Pleasant, interactive, well-appearing; in no acute distress HEENT:  Normocephalic and atraumatic. PERRLA. Sclera white. Nasal turbinates pink, moist and patent bilaterally. No rhinorrhea present. Oropharynx pink and moist, without exudate or edema. No lesions, ulcerations, or postnasal drip.  NECK:  Supple w/ fair ROM. No JVD present. Normal carotid impulses w/o bruits. Thyroid symmetrical with no goiter or nodules palpated. No lymphadenopathy.   CV: RRR, no m/r/g, no peripheral edema. Pulses intact, +2 bilaterally. No cyanosis, pallor or clubbing. PULMONARY:  Unlabored, regular breathing. Minimal end expiratory wheezes bilaterally A&P. No accessory muscle use.  GI: BS present and normoactive. Soft, non-tender to palpation. No organomegaly or masses detected.  MSK: No erythema, warmth or tenderness. Cap refil <2 sec all extrem. No deformities or joint swelling noted.  Neuro: A/Ox3. No focal deficits noted.   Skin: Warm, no lesions or rashe Psych: Normal affect and behavior. Judgement and thought content appropriate.     Lab Results:  CBC No results found for: "WBC", "RBC", "HGB", "HCT", "PLT", "MCV", "MCH", "MCHC", "RDW", "LYMPHSABS", "MONOABS", "EOSABS", "BASOSABS"  BMET    Component Value Date/Time   NA 140 06/01/2022 1401   K 4.3 06/01/2022 1401   CL 103 06/01/2022 1401   CO2 23 06/01/2022 1401   GLUCOSE 93 06/01/2022 1401   BUN 16 06/01/2022 1401   CREATININE 0.83 06/01/2022 1401   CALCIUM 8.5  (L) 06/01/2022 1401   GFRNONAA 81 03/25/2020 1101   GFRAA 94 03/25/2020 1101    BNP No results found for: "BNP"   Imaging:  DG Chest 2 View  Result Date: 08/30/2022 CLINICAL DATA:  Cough.  Asthma flare EXAM: CHEST - 2 VIEW COMPARISON:  X-ray 01/16/2021 and older FINDINGS: There is some linear opacity at the left lung base likely scar or atelectasis. No consolidation. Mild peribronchial thickening. No pneumothorax or edema. Normal cardiopericardial silhouette. Curvature of the spine with degenerative changes. IMPRESSION: Left basilar scar or atelectasis.  Mild peribronchial thickening Electronically Signed   By: Karen Kays M.D.   On: 08/30/2022 10:28    methylPREDNISolone acetate (DEPO-MEDROL) injection 80 mg     Date Action Dose Route User   08/30/2022 1019 Given 80 mg Intramuscular (Left Ventrogluteal) Morrie Sheldon E, CMA      omalizumab Geoffry Paradise) prefilled syringe 150 mg     Date Action Dose Route User   07/05/2022 1041 Given 150 mg Subcutaneous (Other) Nat Math, RN      omalizumab Geoffry Paradise) prefilled syringe 150 mg     Date Action Dose Route User   08/02/2022 1303 Given 150 mg Subcutaneous (Other) Forrest Moron, RN           No data to display          No results found for: "NITRICOXIDE"      Assessment & Plan:   Moderate persistent asthmatic bronchitis with exacerbation Acute exacerbation of asthmatic bronchitis with elevated exhaled nitric oxide. GERD likely exacerbating. We will treat her with depo inj 80 mg x 1, prednisone burst, and empiric doxycycline. Step up Advair to 250 mcg dose. Target cough control measures. CBC with diff  to assess eosinophils. May need to consider change in biologic therapy if she has any further flares or cough remains poorly controlled. Asthma action plan in place. She is on multiple regimens for GERD and understands reduction strategies; awaiting EGD for further evaluation.   Patient Instructions  Increase Advair to  250 mcg dose. 1 puff Twice daily. Brush tongue and rinse mouth  Continue Albuterol inhaler 2 puffs or 3 mL neb every 6 hours as needed for shortness of breath or wheezing. Notify if symptoms persist despite rescue inhaler/neb use. Continue famotidine 40 mg Twice daily  Continue flonase nasal spray 2 sprays each nostril daily Continue loratadine 1 tab daily Continue Xolair injections as scheduled; postpone today's injection a few days to make sure you are feeling better Continue omeprazole 40 mg Twice daily for reflux  Prednisone 40 mg daily for 5 days. Take in AM with food Doxycycline 1 tab Twice daily for 7 days. Take with food. Wear sunscreen when outside; medication increases risk for sunburns Benzonatate 1 capsule Three times a day for cough. Use consistently over the next 3-4 days Delsym cough syrup 2 tsp Twice daily over the counter for cough  Chest x ray and labs today   Follow up in 2 weeks with Dr. Maple Hudson or Philis Nettle. If symptoms do not improve or worsen, please contact office for sooner follow up or seek emergency care.    Seasonal and perennial allergic rhinitis Well-controlled on current regimen.   GERD (gastroesophageal reflux disease) She is on twice daily PPI therapy and famotidine for breakthrough. Awaiting EGD for further evaluation.    I spent 35 minutes of dedicated to the care of this patient on the date of this encounter to include pre-visit review of records, face-to-face time with the patient discussing conditions above, post visit ordering of testing, clinical documentation with the electronic health record, making appropriate referrals as documented, and communicating necessary findings to members of the patients care team.  Noemi Chapel, NP 08/30/2022  Pt aware and understands NP's role.   

## 2022-08-31 NOTE — Progress Notes (Signed)
Eosinophils remain significantly elevated at 600. We may need to change her Xolair to an alternative biologic therapy. Can discuss at follow up. Continue our plan as discussed yesterday. Thanks.

## 2022-09-01 ENCOUNTER — Ambulatory Visit (INDEPENDENT_AMBULATORY_CARE_PROVIDER_SITE_OTHER): Payer: Medicare Other

## 2022-09-01 VITALS — BP 158/75 | HR 58 | Temp 97.9°F | Resp 16 | Ht 61.0 in | Wt 155.8 lb

## 2022-09-01 DIAGNOSIS — J4541 Moderate persistent asthma with (acute) exacerbation: Secondary | ICD-10-CM

## 2022-09-01 MED ORDER — OMALIZUMAB 150 MG/ML ~~LOC~~ SOSY
150.0000 mg | PREFILLED_SYRINGE | Freq: Once | SUBCUTANEOUS | Status: AC
  Administered 2022-09-01: 150 mg via SUBCUTANEOUS
  Filled 2022-09-01: qty 1

## 2022-09-01 NOTE — Progress Notes (Signed)
Diagnosis: Asthma  Provider:  Marshell Garfinkel MD  Procedure: Injection  Xolair (Omalizumab), Dose: 150 mg, Site: subcutaneous, Number of injections: 1  Post Care:  n/a  Discharge: Condition: Good, Destination: Home . AVS provided to patient.   Performed by:  Adelina Mings, LPN

## 2022-09-05 ENCOUNTER — Telehealth: Payer: Self-pay

## 2022-09-05 NOTE — Telephone Encounter (Signed)
LMOM for Pt to return call for results of blood work.

## 2022-09-05 NOTE — Telephone Encounter (Signed)
-----  Message from Marland Kitchen V, NP sent at 08/31/2022  3:44 PM EST ----- Eosinophils remain significantly elevated at 600. We may need to change her Xolair to an alternative biologic therapy. Can discuss at follow up. Continue our plan as discussed yesterday. Thanks.

## 2022-09-08 ENCOUNTER — Ambulatory Visit: Payer: Medicare Other | Admitting: Podiatry

## 2022-09-13 ENCOUNTER — Encounter: Payer: Self-pay | Admitting: Nurse Practitioner

## 2022-09-13 ENCOUNTER — Telehealth: Payer: Self-pay | Admitting: Pharmacist

## 2022-09-13 ENCOUNTER — Telehealth: Payer: Self-pay | Admitting: Internal Medicine

## 2022-09-13 ENCOUNTER — Telehealth: Payer: Self-pay

## 2022-09-13 ENCOUNTER — Ambulatory Visit (INDEPENDENT_AMBULATORY_CARE_PROVIDER_SITE_OTHER): Payer: Medicare Other | Admitting: Nurse Practitioner

## 2022-09-13 VITALS — BP 126/76 | HR 58 | Ht 61.0 in | Wt 155.6 lb

## 2022-09-13 DIAGNOSIS — Z01818 Encounter for other preprocedural examination: Secondary | ICD-10-CM

## 2022-09-13 DIAGNOSIS — J4540 Moderate persistent asthma, uncomplicated: Secondary | ICD-10-CM | POA: Diagnosis not present

## 2022-09-13 DIAGNOSIS — J4541 Moderate persistent asthma with (acute) exacerbation: Secondary | ICD-10-CM | POA: Diagnosis not present

## 2022-09-13 DIAGNOSIS — K219 Gastro-esophageal reflux disease without esophagitis: Secondary | ICD-10-CM

## 2022-09-13 HISTORY — DX: Encounter for other preprocedural examination: Z01.818

## 2022-09-13 LAB — POCT EXHALED NITRIC OXIDE: FeNO level (ppb): 27

## 2022-09-13 NOTE — Assessment & Plan Note (Addendum)
Resolving asthma exacerbation. Clinically improved today. FeNO remains slightly elevated but given her improvement, would hold off on further intervention. She has had two exacerbations over the past 6-12 months requiring steroids and abx despite biologic therapy with Xolair. She has had increasing eosinophils; now up to 600. Discussed case with Dr. Annamaria Boots. We will stop Xolair and change her to [place]. Message sent to pharmacy team. Medication education and risks reviewed with patient. She will continue high dose ICS/LABA therapy in interim. Asthma action plan in place.  Patient Instructions  Continue Advair to 250 mcg dose. 1 puff Twice daily. Brush tongue and rinse mouth  Continue Albuterol inhaler 2 puffs or 3 mL neb every 6 hours as needed for shortness of breath or wheezing. Notify if symptoms persist despite rescue inhaler/neb use. Continue famotidine 40 mg Twice daily  Continue flonase nasal spray 2 sprays each nostril daily Continue loratadine 1 tab daily Continue omeprazole 40 mg Twice daily for reflux  Continue your Xolair injections until we decide which asthma shot to switch you to.    Follow up in 8-12 weeks with Dr. Annamaria Boots or Alanson Aly. If symptoms do not improve or worsen, please contact office for sooner follow up or seek emergency care.

## 2022-09-13 NOTE — Telephone Encounter (Signed)
Per [facility] can we start the process of getting this pt on Dupixent. Thank you.

## 2022-09-13 NOTE — Telephone Encounter (Signed)
Will start [place]. Left VM for patient to determine if she is amenable to self-administering as this is an autoinjector formulation.  DMW was not completed at [place] today  Knox Saliva, PharmD, MPH, BCPS, CPP Clinical Pharmacist (Rheumatology and Pulmonology)

## 2022-09-13 NOTE — Telephone Encounter (Signed)
Please start [place]. Currently receives Xolair at infusion center and was not open to transiitoning to self-admin but may be willing since Dupixent is pen injector  Dose; 600mg  SQ at Week 0 then 300mg  SQ every 14 days thereafter  Patient has [employer] insurance  Left VM for patient to determine if she is amenable to self-administering as this is an autoinjector formulation.   DMW was not completed at [place] today  Knox Saliva, PharmD, MPH, BCPS, CPP Clinical Pharmacist (Rheumatology and Pulmonology)

## 2022-09-13 NOTE — Progress Notes (Signed)
@Patient  ID: , female    DOB: 1946-04-30, 77 y.o.   MRN: [id number]  Chief Complaint  Patient presents with   Follow-up    Pt f/u for asthmatic bronchitis, she is feeling better and has finished doxycycline.     Referring provider: [id number], PA-C  HPI: 77 year old female, never smoker followed for asthma on Xolair. She is a patient of Dr. 61 and last seen in office 04/12/2022. Past medical history significant for allergic rhinitis, peripheral neuropathy, glaucoma, GERD, mitral valve disease, fibromyalgia, HLD.   TEST/EVENTS:  2017 spirometry: FVC  83, FEV1 90, ratio 80 02/2021: eos 500  08/30/2021: eos 600, 48 ppb  08/30/2022: some linear opacity at left lung base, likely atelectasis or scarring. Mild peribronchial thickening, consistent with bronchitis.  04/12/2022: OV with Dr. 04/14/2022.  Chronic obstructive asthma on Xolair and often steroid-dependent.  Currently managed on Advair 100 and biologic therapy.  Feels like Xolair has made a huge difference in her quality of life with dramatic reduction in incidence of asthma exacerbation, systemic steroids and hospitalization.  Unable to give her own injections due to poor eyesight and other reasons.  Feels like her increased cough comes around the time of her next Xolair injection.  Currently doing well.  Asks for standby prescription for prednisone and Biaxin to hold in case of bronchitis in the coming winter months.  08/30/2022: OV with Rooney Swails NP for surgical clearance prior to colonoscopy and endoscopy scheduled for February 15.  She tells me that she was doing well with her asthma up until around Christmas time.  She started having increased cough, especially at night.  Thought that it was related to her reflux but has not noticed a huge change.  She takes omeprazole twice daily, Pepcid twice daily and sleeps with her head of bed elevated.  She also avoids eating anywhere from 2 to 4 hours before bed.  Describes the cough as  mostly nonproductive but congested.  She will occasionally produce some yellow sputum in the morning.  Breathing overall feels stable.  Does have some increased chest tightness.  Has not noticed any significant wheezing.  Denies any fever, chills, hemoptysis, difficulties swallowing, N/V. She is on Advair 100 and Xolair, due for injection today.  Treated for asthmatic bronchitis with elevated exhaled nitric oxide with depo, prednisone burst and empiric doxycycline. Eosinophils rechecked; significantly elevated at 600  09/13/2022: Today - follow up Patient presents today for follow up after being treated for asthmatic bronchitis and to get cleared before her EGD 2/15. She is feeling much better today. Tells me that after completing the prednisone burst and doxycycline, her cough resolved. She feels like her breathing is at baseline. Denies any wheezing, chest tightness, congestion. She started Advair 250 mcg since she was here last; tolerating well.   FeNO 27 ppb  Allergies  Allergen Reactions   Iodinated Contrast Media Shortness Of Breath and Swelling    Swelling in facial/No issues with shellfish    Azithromycin Hives    No problem with Biaxin   Iodine Swelling   Levofloxacin     Tendon tears   Misc. Sulfonamide Containing Compounds    Montelukast Other (See Comments)   Montelukast Sodium Swelling   No Known Allergies Other (See Comments)   Cefaclor Rash   Penicillins Swelling   Sulfa Antibiotics Rash   Sulfonamide Derivatives Rash    Immunization History  Administered Date(s) Administered   Fluad Quad(high Dose 65+) 06/06/2019, 06/10/2020, 05/27/2021  Influenza Split 05/30/2011, 05/31/2012, 06/16/2014, 05/25/2015   Influenza Whole 05/21/2008, 06/07/2010   Influenza, High Dose Seasonal PF 05/26/2016, 06/13/2017, 06/14/2018   Influenza,inj,Quad PF,6+ Mos 06/17/2013   PFIZER(Purple Top)SARS-COV-2 Vaccination 08/24/2019, 09/28/2019   Pneumococcal Conjugate-13 07/10/2013    Pneumococcal Polysaccharide-23 08/22/1998   Tdap 02/28/2011   Zoster, Live 05/09/2016    Past Medical History:  Diagnosis Date   Allergic rhinitis, cause unspecified    Asthma    Chronic airway obstruction, not elsewhere classified    Coronary atherosclerosis of unspecified type of vessel, native or graft    COVID-19 09/2020   Mitral valve disorders(424.0)    Mononeuritis of unspecified site    Myalgia and myositis, unspecified    Peripheral vascular disease, unspecified (HCC)    Sleep disturbance, unspecified    Unspecified glaucoma(365.9)     Tobacco History: Social History   Tobacco Use  Smoking Status Never   Passive exposure: Never  Smokeless Tobacco Never   Counseling given: Not Answered   Outpatient Medications Prior to Visit  Medication Sig Dispense Refill   albuterol (PROAIR HFA) 108 (90 Base) MCG/ACT inhaler Inhale 2 puffs into the lungs every 6 (six) hours as needed for wheezing. 56 g 4   albuterol (PROVENTIL) (2.5 MG/3ML) 0.083% nebulizer solution Take 3 mLs (2.5 mg total) by nebulization 4 (four) times daily as needed for wheezing. 360 mL 12   alclomethasone (ACLOVATE) 0.05 % ointment Apply 1 application topically as needed (rash).     Ascorbic Acid (VITAMIN C) 1000 MG tablet Take 1,000 mg by mouth daily.     benzonatate (TESSALON) 200 MG capsule Take 1 capsule (200 mg total) by mouth 3 (three) times daily as needed for cough. 30 capsule 1   calcium gluconate 500 MG tablet Take 500 mg by mouth 2 (two) times daily.       cholecalciferol (VITAMIN D) 1000 UNITS tablet Take 3,000 Units by mouth daily.     clindamycin (CLEOCIN) 150 MG capsule Take 150 mg by mouth 4 (four) times daily. Prior to Dental Procedures     clopidogrel (PLAVIX) 75 MG tablet Take 1 tablet (75 mg total) by mouth daily. 90 tablet 2   Coenzyme Q10 (COQ10) 100 MG CAPS Take 1 capsule by mouth daily.     diclofenac sodium (VOLTAREN) 1 % GEL Apply 4 g topically 4 (four) times daily. 100 g 1    DUREZOL 0.05 % EMUL Apply 1 mL topically as needed (dry skin).     EPINEPHrine 0.3 mg/0.3 mL IJ SOAJ injection Inject 0.3 mg into the muscle as needed for anaphylaxis. 6 each 4   famotidine (PEPCID) 40 MG tablet Take 40 mg by mouth 2 (two) times daily.     finasteride (PROSCAR) 5 MG tablet Take 2.5 mg by mouth daily.     fish oil-omega-3 fatty acids 1000 MG capsule Take 1 g by mouth daily. 4 by mouth daily     fluticasone (FLONASE) 50 MCG/ACT nasal spray Place 2 sprays into both nostrils daily. 48 g 4   fluticasone-salmeterol (ADVAIR) 250-50 MCG/ACT AEPB Inhale 1 puff into the lungs in the morning and at bedtime. 60 each 5   gabapentin (NEURONTIN) 600 MG tablet Take 600 mg by mouth at bedtime.     Latanoprost (XELPROS) 0.005 % EMUL Place 1 drop into both eyes daily.     loratadine (CLARITIN) 10 MG tablet Take 1 tablet (10 mg total) by mouth daily. 90 tablet 4   meclizine (ANTIVERT) 12.5 MG tablet Take  12.5 mg by mouth 3 (three) times daily as needed for dizziness.     methocarbamol (ROBAXIN) 500 MG tablet Take 500 mg by mouth daily.     methocarbamol (ROBAXIN) 750 MG tablet Take 750 mg by mouth every 6 (six) hours as needed for muscle spasms.     Multiple Vitamins-Minerals (ZINC PO) Take 1 tablet by mouth daily. Unknown strength     omalizumab Geoffry [place]) 150 MG/ML prefilled syringe Inject 150 mg into the skin every 28 (twenty-eight) days. 3 mL 2   omeprazole (PRILOSEC) 40 MG capsule Take 40 mg by mouth in the morning and at bedtime.     potassium chloride (KLOR-CON) 10 MEQ tablet TAKE 1 TABLET BY MOUTH EVERY DAY 90 tablet 3   pyridOXINE (VITAMIN B-6) 25 MG tablet Take 25 mg by mouth daily.     RESTASIS 0.05 % ophthalmic emulsion Place 1 drop into both eyes 2 (two) times daily.     rosuvastatin (CRESTOR) 5 MG tablet Take 1 tablet (5 mg total) by mouth daily. 90 tablet 2   telmisartan (MICARDIS) 20 MG tablet Take 1 tablet (20 mg total) by mouth at bedtime. 90 tablet 1   vitamin B-12 (CYANOCOBALAMIN)  1000 MCG tablet Take 1,000 mcg by mouth daily.     doxycycline (VIBRA-TABS) 100 MG tablet Take 1 tablet (100 mg total) by mouth 2 (two) times daily. 14 tablet 0   No facility-administered medications prior to visit.     Review of Systems:   Constitutional: No weight loss or gain, night sweats, fevers, chills, fatigue, or lassitude. HEENT: No headaches, difficulty swallowing, tooth/dental problems, or sore throat. No sneezing, itching, ear ache, nasal congestion, or post nasal drip CV:  No chest pain, orthopnea, PND, swelling in lower extremities, anasarca, dizziness, palpitations, syncope Resp: No shortness of breath with exertion or at rest. No cough. No hemoptysis. No wheezing.  No chest wall deformity GI:  No heartburn, indigestion, abdominal pain, loss of appetite GU: No dysuria, change in color of urine, urgency or frequency.  Skin: No rash, lesions, ulcerations MSK:  No joint pain or swelling.   Neuro: No dizziness or lightheadedness.  Psych: No depression or anxiety. Mood stable.     Physical Exam:  BP 126/76   Pulse (!) 58   Ht 5\' 1"  (1.549 m)   Wt 155 lb 9.6 oz (70.6 kg)   SpO2 98%   BMI 29.40 kg/m   GEN: Pleasant, interactive, well-appearing; in no acute distress HEENT:  Normocephalic and atraumatic. PERRLA. Sclera white. Nasal turbinates pink, moist and patent bilaterally. No rhinorrhea present. Oropharynx pink and moist, without exudate or edema. No lesions, ulcerations, or postnasal drip.  NECK:  Supple w/ fair ROM. No JVD present. Normal carotid impulses w/o bruits. Thyroid symmetrical with no goiter or nodules palpated. No lymphadenopathy.   CV: RRR, no m/r/g, no peripheral edema. Pulses intact, +2 bilaterally. No cyanosis, pallor or clubbing. PULMONARY:  Unlabored, regular breathing. Clear bilaterally A&P w/o wheezes/rales/rhonchi. No accessory muscle use.  GI: BS present and normoactive. Soft, non-tender to palpation. No organomegaly or masses detected.  MSK: No  erythema, warmth or tenderness. Cap refil <2 sec all extrem. No deformities or joint swelling noted.  Neuro: A/Ox3. No focal deficits noted.   Skin: Warm, no lesions or rashe Psych: Normal affect and behavior. Judgement and thought content appropriate.     Lab Results:  CBC    Component Value Date/Time   WBC 8.0 08/30/2022 1014   RBC 4.30 08/30/2022 1014  HGB 13.0 08/30/2022 1014   HCT 39.8 08/30/2022 1014   PLT 306.0 08/30/2022 1014   MCV 92.7 08/30/2022 1014   MCHC 32.7 08/30/2022 1014   RDW 14.0 08/30/2022 1014   LYMPHSABS 2.3 08/30/2022 1014   MONOABS 0.6 08/30/2022 1014   EOSABS 0.6 08/30/2022 1014   BASOSABS 0.1 08/30/2022 1014    BMET    Component Value Date/Time   NA 140 06/01/2022 1401   K 4.3 06/01/2022 1401   CL 103 06/01/2022 1401   CO2 23 06/01/2022 1401   GLUCOSE 93 06/01/2022 1401   BUN 16 06/01/2022 1401   CREATININE 0.83 06/01/2022 1401   CALCIUM 8.5 (L) 06/01/2022 1401   GFRNONAA 81 03/25/2020 1101   GFRAA 94 03/25/2020 1101    BNP No results found for: "BNP"   Imaging:  DG Chest 2 View  Result Date: 08/30/2022 CLINICAL DATA:  Cough.  Asthma flare EXAM: CHEST - 2 VIEW COMPARISON:  X-ray 01/16/2021 and older FINDINGS: There is some linear opacity at the left lung base likely scar or atelectasis. No consolidation. Mild peribronchial thickening. No pneumothorax or edema. Normal cardiopericardial silhouette. Curvature of the spine with degenerative changes. IMPRESSION: Left basilar scar or atelectasis.  Mild peribronchial thickening Electronically Signed   By: Jill Side M.D.   On: 08/30/2022 10:28    methylPREDNISolone acetate (DEPO-MEDROL) injection 80 mg     Date Action Dose Route User   08/30/2022 1019 Given 80 mg Intramuscular (Left Ventrogluteal) Mellody Life E, CMA      omalizumab Arvid Right) prefilled syringe 150 mg     Date Action Dose Route User   08/02/2022 1303 Given 150 mg Subcutaneous (Other) Oren Beckmann, RN       omalizumab Arvid Right) prefilled syringe 150 mg     Date Action Dose Route User   09/01/2022 1132 Given 150 mg Subcutaneous (Right Anterior Thigh) Batten, Sherlon Handing, LPN           No data to display          No results found for: "NITRICOXIDE"      Assessment & Plan:   Asthma, moderate persistent Resolving asthma exacerbation. Clinically improved today. FeNO remains slightly elevated but given her improvement, would hold off on further intervention. She has had two exacerbations over the past 6-12 months requiring steroids and abx despite biologic therapy with Xolair. She has had increasing eosinophils; now up to 600. Discussed case with Dr. Annamaria Boots. We will stop Xolair and change her to [place]. Message sent to pharmacy team. Medication education and risks reviewed with patient. She will continue high dose ICS/LABA therapy in interim. Asthma action plan in place.  Patient Instructions  Continue Advair to 250 mcg dose. 1 puff Twice daily. Brush tongue and rinse mouth  Continue Albuterol inhaler 2 puffs or 3 mL neb every 6 hours as needed for shortness of breath or wheezing. Notify if symptoms persist despite rescue inhaler/neb use. Continue famotidine 40 mg Twice daily  Continue flonase nasal spray 2 sprays each nostril daily Continue loratadine 1 tab daily Continue omeprazole 40 mg Twice daily for reflux  Continue your Xolair injections until we decide which asthma shot to switch you to.    Follow up in 8-12 weeks with Dr. Annamaria Boots or Alanson Aly. If symptoms do not improve or worsen, please contact office for sooner follow up or seek emergency care.    GERD (gastroesophageal reflux disease) Poorly controlled. Plan for EGD 2/15 for further evaluation. Evaluation or surgical clearance  completed today.   Preoperative clearance Factors that increase the risk for postoperative pulmonary complications are poorly controlled asthma, age, CAD  Respiratory complications generally  occur in 1% of ASA Class I patients, 5% of ASA Class II and 10% of ASA Class III-IV patients These complications rarely result in mortality and include postoperative pneumonia, atelectasis, pulmonary embolism, ARDS and increased time requiring postoperative mechanical ventilation.   Overall, I recommend proceeding with the surgery if the risk for respiratory complications are outweighed by the potential benefits. This will need to be discussed between the patient and surgeon.   To reduce risks of respiratory complications, I recommend: --Pre- and post-operative incentive spirometry performed frequently while awake --Inpatient use of currently prescribed bronchodilators --Short duration of surgery as much as possible and avoid paralytic if possible --OOB, encourage mobility post-op   1) RISK FOR PROLONGED MECHANICAL VENTILAION - > 48h  1A) Arozullah - Prolonged mech ventilation risk Arozullah Postperative Pulmonary Risk Score - for mech ventilation dependence >48h [employer], Ann Surg 2000, major non-cardiac surgery) Comment Score  Type of surgery - abd ao aneurysm (27), thoracic (21), neurosurgery / upper abdominal / vascular (21), neck (11) endoscopy 3  Emergency Surgery - (11)  0  ALbumin < 3 or poor nutritional state - (9)  0  BUN > 30 -  (8)  0  Partial or completely dependent functional status - (7)  0  COPD -  (6) asthma 6  Age - 60 to 66 (4), > 70  (6) 75 6  TOTAL  12  Risk Stratifcation scores  - < 10 (0.5%), 11-19 (1.8%), 20-27 (4.2%), 28-40 (10.1%), >40 (26.6%)  1.8%      I spent 35 minutes of dedicated to the care of this patient on the date of this encounter to include pre-visit review of records, face-to-face time with the patient discussing conditions above, post visit ordering of testing, clinical documentation with the electronic health record, making appropriate referrals as documented, and communicating necessary findings to members of the patients care  team.  Noemi Chapel, NP 09/13/2022  Pt aware and understands NP's role.

## 2022-09-13 NOTE — Assessment & Plan Note (Signed)
Factors that increase the risk for postoperative pulmonary complications are poorly controlled asthma, age, CAD  Respiratory complications generally occur in 1% of ASA Class I patients, 5% of ASA Class II and 10% of ASA Class III-IV patients These complications rarely result in mortality and include postoperative pneumonia, atelectasis, pulmonary embolism, ARDS and increased time requiring postoperative mechanical ventilation.   Overall, I recommend proceeding with the surgery if the risk for respiratory complications are outweighed by the potential benefits. This will need to be discussed between the patient and surgeon.   To reduce risks of respiratory complications, I recommend: --Pre- and post-operative incentive spirometry performed frequently while awake --Inpatient use of currently prescribed bronchodilators --Short duration of surgery as much as possible and avoid paralytic if possible --OOB, encourage mobility post-op   1) RISK FOR PROLONGED MECHANICAL VENTILAION - > 48h  1A) Arozullah - Prolonged mech ventilation risk Arozullah Postperative Pulmonary Risk Score - for mech ventilation dependence >48h [employer], Ann Surg 2000, major non-cardiac surgery) Comment Score  Type of surgery - abd ao aneurysm (27), thoracic (21), neurosurgery / upper abdominal / vascular (21), neck (11) endoscopy 3  Emergency Surgery - (11)  0  ALbumin < 3 or poor nutritional state - (9)  0  BUN > 30 -  (8)  0  Partial or completely dependent functional status - (7)  0  COPD -  (6) asthma 6  Age - 60 to 69 (4), > 70  (6) 75 6  TOTAL  12  Risk Stratifcation scores  - < 10 (0.5%), 11-19 (1.8%), 20-27 (4.2%), 28-40 (10.1%), >40 (26.6%)  1.8%

## 2022-09-13 NOTE — Telephone Encounter (Signed)
PT saw Ms. Cobb today. On her AVS from [facility] it states:  Continue your Xolair injections until we decide which asthma shot to switch you to.    Pt enjoyed working with Ms. Cobb and wanted Dr. Annamaria Boots specifically to see this switch and make sure that is in keeping with his treatment plan as her Pulmonologist. In other words a second opinion wanted.  Adv PT what she is asking is no problem and creates no conflict.  Pls call at this # to advise: [phone]

## 2022-09-13 NOTE — Assessment & Plan Note (Signed)
Poorly controlled. Plan for EGD 2/15 for further evaluation. Evaluation or surgical clearance completed today.

## 2022-09-13 NOTE — Patient Instructions (Addendum)
Continue Advair to 250 mcg dose. 1 puff Twice daily. Brush tongue and rinse mouth  Continue Albuterol inhaler 2 puffs or 3 mL neb every 6 hours as needed for shortness of breath or wheezing. Notify if symptoms persist despite rescue inhaler/neb use. Continue famotidine 40 mg Twice daily  Continue flonase nasal spray 2 sprays each nostril daily Continue loratadine 1 tab daily Continue omeprazole 40 mg Twice daily for reflux  Continue your Xolair injections until we decide which asthma shot to switch you to.    Follow up in 8-12 weeks with Dr. Annamaria Boots or Alanson Aly. If symptoms do not improve or worsen, please contact office for sooner follow up or seek emergency care.

## 2022-09-14 ENCOUNTER — Other Ambulatory Visit: Payer: Self-pay | Admitting: Pharmacist

## 2022-09-14 NOTE — Telephone Encounter (Signed)
Dupixent is available as a pen injector. This is an entirely different formulation than syringes. ATC - did not pick up home phone. Called mobile number as well. Left VM. Advised patient that I need to speak to her directly for confirmation  Knox Saliva, PharmD, MPH, BCPS, CPP Clinical Pharmacist (Rheumatology and Pulmonology)

## 2022-09-14 NOTE — Progress Notes (Signed)
Order placed for [place]. Her next Xolair dose is on 09/29/2022 and she is to continue Xolair until [place] is approved. If approved by 09/29/2022, then she can be started on Dupixent at that visit.  Dose: 600mg  SQ at Week 0 then 300mg  SQ every 2 weeks thereafter   Knox Saliva, PharmD, MPH, BCPS, CPP Clinical Pharmacist (Rheumatology and Pulmonology)

## 2022-09-14 NOTE — Telephone Encounter (Signed)
I spoke with patient today. She states that she'd like to try to work through infusion center if possible as she is not sure about self-administering. I reviewd that coverage for medical billing may be limited but we will try. I did advise that the autoinjector does not require drawing anything into vial but does still require some level of hand stability in holding injection pen against skin to completion. Will plcae referral to infusion center.   Debra Byrd, PharmD, MPH, BCPS, CPP Clinical Pharmacist (Rheumatology and Pulmonology)     

## 2022-09-14 NOTE — Telephone Encounter (Signed)
Spoke with patient she is fine with the change in medication, but states she is uncomfortable giving herself the Dupixent injections, I asked if anyone at home could help her and she advises her husbands hands shakes to much and would not be able to give her those. Pt also states she read there were side effects that were a possibility with Dupixent and she worried she would have a reaction at home and no one be around top assist. She is hoping she can keep receiving injections here in the infusion clinic.  Devki can you please advise

## 2022-09-14 NOTE — Telephone Encounter (Signed)
I spoke with patient today. She states that she'd like to try to work through infusion center if possible as she is not sure about self-administering. I reviewd that coverage for medical billing may be limited but we will try. I did advise that the autoinjector does not require drawing anything into vial but does still require some level of hand stability in holding injection pen against skin to completion. Will plcae referral to infusion center.   Knox Saliva, PharmD, MPH, BCPS, CPP Clinical Pharmacist (Rheumatology and Pulmonology)

## 2022-09-14 NOTE — Telephone Encounter (Signed)
I've left another VM today to speak with patient regarding Dupixent for self-administration  Knox Saliva, PharmD, MPH, BCPS, CPP Clinical Pharmacist (Rheumatology and Pulmonology)

## 2022-09-14 NOTE — Telephone Encounter (Signed)
I discussed the proposed change from Xolair to [place] with Ms Belenda Cruise, NP. Given how Mrs Lucente has been doing in the past year, I think the change is a good idea. If it doesn't work well for her, we can switch back.

## 2022-09-14 NOTE — Telephone Encounter (Signed)
Dr. Young can you please advise

## 2022-09-16 ENCOUNTER — Telehealth: Payer: Self-pay | Admitting: Pharmacy Technician

## 2022-09-16 DIAGNOSIS — J4540 Moderate persistent asthma, uncomplicated: Secondary | ICD-10-CM

## 2022-09-16 NOTE — Telephone Encounter (Addendum)
Auth Submission: PENDING Payer: MEDICARE / TRICARE/BCBS STATE Medication & CPT/J Code(s) submitted:  Desha of submission (phone, fax, portal):  Phone # Fax # Auth type: pharmacy benefit Units/visits requested: Q2WKS Reference number:  Approval from: 09/16/22 to 08/22/23

## 2022-09-20 ENCOUNTER — Other Ambulatory Visit (HOSPITAL_COMMUNITY): Payer: Self-pay

## 2022-09-20 MED ORDER — DUPIXENT 300 MG/2ML ~~LOC~~ SOAJ
300.0000 mg | SUBCUTANEOUS | 0 refills | Status: DC
  Filled 2022-09-20: qty 12, 84d supply, fill #0

## 2022-09-20 MED ORDER — DUPIXENT 300 MG/2ML ~~LOC~~ SOAJ
SUBCUTANEOUS | 0 refills | Status: DC
  Filled 2022-09-20: qty 8, fill #0

## 2022-09-20 NOTE — Addendum Note (Signed)
Addended by: Cassandria Anger on: 09/20/2022 01:30 PM   Modules accepted: Orders

## 2022-09-20 NOTE — Telephone Encounter (Signed)
Rx for Dupixent sent to [facility] to be couriered to infusion center by 09/29/2022  Knox Saliva, PharmD, MPH, BCPS, CPP Clinical Pharmacist (Rheumatology and Pulmonology)

## 2022-09-21 ENCOUNTER — Other Ambulatory Visit (HOSPITAL_COMMUNITY): Payer: Self-pay

## 2022-09-22 ENCOUNTER — Other Ambulatory Visit (HOSPITAL_COMMUNITY): Payer: Self-pay

## 2022-09-26 ENCOUNTER — Ambulatory Visit (INDEPENDENT_AMBULATORY_CARE_PROVIDER_SITE_OTHER): Payer: Medicare Other

## 2022-09-26 ENCOUNTER — Ambulatory Visit (INDEPENDENT_AMBULATORY_CARE_PROVIDER_SITE_OTHER): Payer: Medicare Other | Admitting: Podiatry

## 2022-09-26 DIAGNOSIS — S92514A Nondisplaced fracture of proximal phalanx of right lesser toe(s), initial encounter for closed fracture: Secondary | ICD-10-CM | POA: Diagnosis not present

## 2022-09-26 DIAGNOSIS — B351 Tinea unguium: Secondary | ICD-10-CM

## 2022-09-26 DIAGNOSIS — M79674 Pain in right toe(s): Secondary | ICD-10-CM | POA: Diagnosis not present

## 2022-09-26 DIAGNOSIS — M79675 Pain in left toe(s): Secondary | ICD-10-CM | POA: Diagnosis not present

## 2022-09-26 NOTE — Progress Notes (Signed)
Subjective:  Patient ID: Debra Byrd, female    DOB: May 01, 1946,  MRN: [id number]  Chief Complaint  Patient presents with   Follow-up    right fifth toe fracture.   Nail Problem    Nail trim     77 y.o. female presents for follow-up of right fifth toe fracture.  She states she has less pain than previously she was last seen approximately 6 weeks ago.  She wore the shoe for 4 weeks and then try to get out of it for the past 2 weeks however when she got out of the postoperative shoe that she previously dispensed she was having some pain so she started using it again.  Pain is not that bad at this time but she wanted to be sure that it was healing before she tried to go back to her regular shoes again.  She is also having difficulty trimming her nails and was hoping to get a nail trim at this visit.  Past Medical History:  Diagnosis Date   Allergic rhinitis, cause unspecified    Asthma    Chronic airway obstruction, not elsewhere classified    Coronary atherosclerosis of unspecified type of vessel, native or graft    COVID-19 09/2020   Mitral valve disorders(424.0)    Mononeuritis of unspecified site    Myalgia and myositis, unspecified    Peripheral vascular disease, unspecified (HCC)    Sleep disturbance, unspecified    Unspecified glaucoma(365.9)     Allergies  Allergen Reactions   Iodinated Contrast Media Shortness Of Breath and Swelling    Swelling in facial/No issues with shellfish    Azithromycin Hives    No problem with Biaxin   Iodine Swelling   Levofloxacin     Tendon tears   Misc. Sulfonamide Containing Compounds    Montelukast Other (See Comments)   Montelukast Sodium Swelling   No Known Allergies Other (See Comments)   Cefaclor Rash   Penicillins Swelling   Sulfa Antibiotics Rash   Sulfonamide Derivatives Rash    ROS: Negative except as per HPI above  Objective:  General: AAO x3, NAD  Dermatological: With inspection and palpation of the right and left  lower extremities there are no open sores, no preulcerative lesions, no rash or signs of infection present.  Nails are thickened dystrophic and elongated x 5 both feet with pain related to length.  Vascular:  Dorsalis Pedis artery and Posterior Tibial artery pedal pulses are 2/4 bilateral.  Capillary fill time < 3 sec to all digits.   Neruologic: Grossly intact via light touch bilateral. Protective threshold intact to all sites bilateral.   Musculoskeletal: Mild edema noted to the right fifth toe.  There is decreased pain on palpation of the proximal phalanx.  No gross deformity of the toe is noted it is in rectus alignment.  Gait: Antalgic No images are attached to the encounter.  Radiographs:  Date: 09/26/2022 X right foot AP lateral oblique.  Weightbearing.  Findings: Attention directed to the fifth toe proximal phalanx base there is noted to be a transverse fracture with osseous bridging across the entire fracture line roughly 80% healed at this time.  No deformity in the transverse or sagittal plane is noted and there is no displacement or evidence of nonhealing of the fracture. Assessment:   1. Closed nondisplaced fracture of proximal phalanx of lesser toe of right foot, initial encounter   2. Pain due to onychomycosis of toenails of both feet  Plan:  Patient was evaluated and treated and all questions answered.  # Fracture of proximal phalanx of the right fifth toe transverse at the base of the phalanx with minimal displacement -Improving from prior visit patient does have radiographic evidence of normal to slightly slow healing of this fracture.  I would expect this given her history of osteoporosis. -Recommend continuing using the postoperative shoe for another 2 weeks and then again try to transition out of it back to regular shoe gear.  Buddy splint the fifth toe to the fourth toe if desired for pain control -Patient will follow-up in 4 weeks for recheck of this issue with  another set of x-rays  #Onychomycosis with pain, unable to trim her own nails due to mobility issues -Nails palliatively debrided as below. -Educated on self-care  Procedure: Nail Debridement Rationale: Pain Type of Debridement: manual, sharp debridement. Instrumentation: Nail nipper, rotary burr. Number of Nails: 10  Return in about 4 weeks (around 10/24/2022) for f/u R 5th toe fx.          Everitt Amber, DPM [facility]

## 2022-09-27 ENCOUNTER — Other Ambulatory Visit (HOSPITAL_COMMUNITY): Payer: Self-pay

## 2022-09-27 NOTE — Addendum Note (Signed)
Addended by: Cassandria Anger on: 09/27/2022 03:52 PM   Modules accepted: Orders

## 2022-09-27 NOTE — Telephone Encounter (Addendum)
Drucie Opitz at [facility] is getting rejection - please advise if PA was approved thru pharmacy or medical benefit?     Knox Saliva, PharmD, MPH, BCPS, CPP Clinical Pharmacist (Rheumatology and Pulmonology)

## 2022-09-28 ENCOUNTER — Other Ambulatory Visit (HOSPITAL_COMMUNITY): Payer: Self-pay

## 2022-09-28 MED ORDER — DUPIXENT 300 MG/2ML ~~LOC~~ SOAJ: SUBCUTANEOUS | 0 refills | Status: DC

## 2022-09-28 NOTE — Telephone Encounter (Signed)
Received notification from [facility] regarding a prior authorization for [place]. Authorization has been APPROVED from 09/28/2022 to 03/29/2023. Approval letter sent to scan center.  [facility] fill [facility]: [phone]  Authorization # [id number]

## 2022-09-28 NOTE — Telephone Encounter (Signed)
Rx for Dupixent loading dose [facility]. Routing to [employer] for follow-up  Knox Saliva, PharmD, MPH, BCPS, CPP Clinical Pharmacist (Rheumatology and Pulmonology)

## 2022-09-28 NOTE — Telephone Encounter (Signed)
Per Maudie Mercury, no PA has been submitted through pharmacy benefits at this time.  Submitted a Prior Authorization request to [place]  for [place] via CoverMyMeds. Will update once we receive a response.  Key: Marylen Ponto

## 2022-09-29 ENCOUNTER — Ambulatory Visit (INDEPENDENT_AMBULATORY_CARE_PROVIDER_SITE_OTHER): Payer: Medicare Other

## 2022-09-29 VITALS — BP 144/72 | HR 60 | Temp 97.6°F | Resp 16 | Ht 61.0 in | Wt 153.4 lb

## 2022-09-29 DIAGNOSIS — J4541 Moderate persistent asthma with (acute) exacerbation: Secondary | ICD-10-CM

## 2022-09-29 MED ORDER — OMALIZUMAB 150 MG/ML ~~LOC~~ SOSY
150.0000 mg | PREFILLED_SYRINGE | Freq: Once | SUBCUTANEOUS | Status: AC
  Administered 2022-09-29: 150 mg via SUBCUTANEOUS
  Filled 2022-09-29: qty 1

## 2022-09-29 NOTE — Progress Notes (Signed)
Diagnosis: Asthma  Provider:  Marshell Garfinkel MD  Procedure: Injection  Xolair (Omalizumab), Dose: 150 mg, Site: subcutaneous, Number of injections: 1  Post Care:  n/a  Discharge: Condition: Good, Destination: Home . AVS Provided  Performed by:  Cleophus Molt, RN

## 2022-09-30 MED ORDER — DUPIXENT 300 MG/2ML ~~LOC~~ SOSY: PREFILLED_SYRINGE | SUBCUTANEOUS | 0 refills | Status: DC

## 2022-09-30 NOTE — Telephone Encounter (Signed)
Rx for Dupixent PFS (previously sent for pen) sent to [place], PharmD, MPH, BCPS, CPP Clinical Pharmacist (Rheumatology and Pulmonology)

## 2022-09-30 NOTE — Addendum Note (Signed)
Addended by: Cassandria Anger on: 09/30/2022 10:45 AM   Modules accepted: Orders

## 2022-10-04 ENCOUNTER — Telehealth: Payer: Self-pay

## 2022-10-04 NOTE — Telephone Encounter (Signed)
     Patient  visit on 09/20/2022  at [facility] was for treatment.  Have you been able to follow up with your primary care physician? Yes  The patient was or was not able to obtain any needed medicine or equipment. Patient was able to obtain medication.  Are there diet recommendations that you are having difficulty following? No  Patient expresses understanding of discharge instructions and education provided has no other needs at this time. Yes   [place] Resource Care Guide   [email or web address]  ?? [phone]   Website: [email or web address]  [email or web address]

## 2022-10-20 NOTE — Telephone Encounter (Signed)
Debra Byrd, No, they did not mention co-pay.

## 2022-10-20 NOTE — Telephone Encounter (Signed)
Per pt, she had been quoted a price of $600+ for [facility]. She was referred to the [place] program for assistance and went through a lengthy process prompting DMW to send a request to the office for a completed enrollment application. I advised the patient that she should have a copay of $0 if her medication was correctly billed through her FEP [place] as primary and [employer] as secondary. I informed the pt that I would personally contact [facility] on her behalf for additional information.   Spoke with rep who informed me that they did NOT have pt's [employer] information on file, which I then provided. After the rep attempted to bill the Rx through both she informed me that [employer] is not contracted with [facility] and will therefore not cover the medication.   Reached back out to pt and shared update, advised her that we would look into potentially getting an override placed with one of her insurances to allow filling with non-contracted pharmacies, but that the worst case scenario we would simply submit her to patient assistance. I reassured her that one way or another we would ensure she is able to get this medication free of charge.

## 2022-10-21 DIAGNOSIS — N812 Incomplete uterovaginal prolapse: Secondary | ICD-10-CM | POA: Insufficient documentation

## 2022-10-21 HISTORY — DX: Incomplete uterovaginal prolapse: N81.2

## 2022-10-24 ENCOUNTER — Ambulatory Visit (INDEPENDENT_AMBULATORY_CARE_PROVIDER_SITE_OTHER): Payer: Medicare Other | Admitting: Podiatry

## 2022-10-24 ENCOUNTER — Ambulatory Visit (INDEPENDENT_AMBULATORY_CARE_PROVIDER_SITE_OTHER): Payer: Medicare Other

## 2022-10-24 DIAGNOSIS — S92514G Nondisplaced fracture of proximal phalanx of right lesser toe(s), subsequent encounter for fracture with delayed healing: Secondary | ICD-10-CM | POA: Diagnosis not present

## 2022-10-24 NOTE — Progress Notes (Signed)
Subjective:  Patient ID: Debra Byrd, female    DOB: [birth year],  MRN: [id number]  Chief Complaint  Patient presents with   Fracture    Right 5th toe fracture     77 y.o. female presents for follow-up of right fifth toe fracture.  She states pain is decreased from prior visit.  She has been getting back to walking in regular shoes at this time and overall doing well with that.  She occasionally needs to the postop shoe for pain relief but that is infrequent.  Past Medical History:  Diagnosis Date   Allergic rhinitis, cause unspecified    Asthma    Chronic airway obstruction, not elsewhere classified    Coronary atherosclerosis of unspecified type of vessel, native or graft    COVID-19 09/2020   Mitral valve disorders(424.0)    Mononeuritis of unspecified site    Myalgia and myositis, unspecified    Peripheral vascular disease, unspecified (HCC)    Sleep disturbance, unspecified    Unspecified glaucoma(365.9)     Allergies  Allergen Reactions   Iodinated Contrast Media Shortness Of Breath and Swelling    Swelling in facial/No issues with shellfish    Azithromycin Hives    No problem with Biaxin   Iodine Swelling   Levofloxacin     Tendon tears   Misc. Sulfonamide Containing Compounds    Montelukast Other (See Comments)   Montelukast Sodium Swelling   No Known Allergies Other (See Comments)   Cefaclor Rash   Penicillins Swelling   Sulfa Antibiotics Rash   Sulfonamide Derivatives Rash    ROS: Negative except as per HPI above  Objective:  General: AAO x3, NAD  Dermatological: With inspection and palpation of the right and left lower extremities there are no open sores, no preulcerative lesions, no rash or signs of infection present.    Vascular:  Dorsalis Pedis artery and Posterior Tibial artery pedal pulses are 2/4 bilateral.  Capillary fill time < 3 sec to all digits.   Neruologic: Grossly intact via light touch bilateral. Protective threshold intact to all  sites bilateral.   Musculoskeletal: Minimal to no edema noted to the right fifth toe.  There is decreased pain on palpation of the proximal phalanx.  No gross deformity of the toe is noted it is in rectus alignment.  Gait: Antalgic No images are attached to the encounter.  Radiographs:  Date: 10/24/2022 X right foot AP lateral oblique.  Weightbearing.  Findings: Attention directed to the fifth toe proximal phalanx base there is noted to be a transverse fracture with osseous bridging across the entire fracture line roughly 80% healed at this time stable from prior without much increase from prior visit.  No deformity in the transverse or sagittal plane is noted and there is no displacement or evidence of nonhealing of the fracture. Assessment:   1. Closed nondisplaced fracture of proximal phalanx of lesser toe of right foot with delayed healing, subsequent encounter      Plan:  Patient was evaluated and treated and all questions answered.  # Fracture of proximal phalanx of the right fifth toe transverse at the base of the phalanx with minimal displacement -Improving from prior visit patient does have radiographic evidence of normal to slightly slow healing of this fracture.  I would expect this given her history of osteoporosis. -Recommend continuing using regular shoe gears as tolerated and go back to the postop shoe if she has any pain -Patient will follow-up as needed for this fracture if  there is any issue after another 4 to 6 weeks of healing   Return in about 2 months (around 12/24/2022) for RFC.          Everitt Amber, DPM [facility] [place] [facility]

## 2022-10-27 ENCOUNTER — Ambulatory Visit (INDEPENDENT_AMBULATORY_CARE_PROVIDER_SITE_OTHER): Payer: Medicare Other | Admitting: *Deleted

## 2022-10-27 ENCOUNTER — Other Ambulatory Visit (HOSPITAL_COMMUNITY): Payer: Self-pay

## 2022-10-27 VITALS — BP 122/79 | HR 50 | Temp 97.6°F | Resp 16 | Ht 61.0 in | Wt 154.6 lb

## 2022-10-27 DIAGNOSIS — J4541 Moderate persistent asthma with (acute) exacerbation: Secondary | ICD-10-CM

## 2022-10-27 MED ORDER — OMALIZUMAB 150 MG/ML ~~LOC~~ SOSY
150.0000 mg | PREFILLED_SYRINGE | Freq: Once | SUBCUTANEOUS | Status: AC
  Administered 2022-10-27: 150 mg via SUBCUTANEOUS

## 2022-10-27 NOTE — Telephone Encounter (Addendum)
Infusion center received Dupixent shipment with copay of $600. Per patient today, she states she did not consent to this copay.  [facility] to determine status of patient's consent. Per rep, they did not have consent as of 10/21/2022. They have notation from [place], infusion center patient advocate, that she did not want medication shipment with balance - this conversation was on 10/21/2022. Rep transferred me to account solutions department with patient conferenced called in (they can only talk to me with patient on the line) . Per rep, case is escalated and may take 4-6 weeks to reach determination. Patient states she called [facility] several times and advised that she cannot and will not pay this copay.  Called [employer] (Phone: [phone]) to request override for patient to fill [facility]. Per rep, pt would have to fill with [facility] but [employer] is her primary plan and [facility] will not be able to bill through her primary. Rep had absolutely no idea what I was talking about after multiple back and forth attempted clarifications on situation.  Will begin PAP process as backup. I spoke with patient regarding PAP application. She states she never signed form for PAP. She requested form be emailed to her daughter's email ([email or web address]). Provider portion completed by Dr. Annamaria Boots today. Letter of attestation explaining coverage situation also printed for pt's application. This has been placed in PAP pending info folder in pharmacy office.  Technically pt has no Medicare rx coverage so may be eligible for copay card. Patient enrolled into Dupixent copay card.  Physical card activated today and billing information provided to [facility]: ID: [id number] BIN: [id number] PCN: [id number] Group: [id number]  Per rep in billing department, will take 5-7 business days. Requested that they call patient to advise if processed successfully then the dispute can be closed out.  Knox Saliva,  PharmD, MPH, BCPS, CPP Clinical Pharmacist (Rheumatology and Pulmonology)

## 2022-10-27 NOTE — Telephone Encounter (Signed)
Created in error, please disregard

## 2022-10-27 NOTE — Progress Notes (Signed)
Diagnosis: Asthma  Provider:  Marshell Garfinkel MD  Procedure: Injection  Xolair (Omalizumab), Dose: 150 mg, Site: subcutaneous, Number of injections: 1  Post Care: Observation period completed  Discharge: Condition: Good, Destination: Home . AVS Provided and AVS Declined  Performed by:  Oren Beckmann, RN

## 2022-11-08 ENCOUNTER — Telehealth: Payer: Self-pay | Admitting: Internal Medicine

## 2022-11-08 NOTE — Telephone Encounter (Signed)
Pt called stating that she has not received any emails about the [place]. Due to this, pt is wanting to have a call from [facility] about this. She said this is concerning the discount card for [place].  Devki, please advise.

## 2022-11-09 ENCOUNTER — Telehealth: Payer: Self-pay | Admitting: Pharmacist

## 2022-11-09 NOTE — Telephone Encounter (Signed)
[facility] to determine status. Per rep, Dupixent copay card did not pay. Rejected for non-matched cardholder ID  Per rep, claim rejected on both 10/27/2022 and 10/31/2022  Called Dupixent copay card to activate card again: ID: [id number] [place]: [id number] PCN: [id number] Group: [id number]  Rep said to allow another 7-10 business days to allow them to re-process. I requested that they call the patient if there are issues as this was not done last time  Knox Saliva, PharmD, MPH, BCPS, CPP Clinical Pharmacist (Rheumatology and Pulmonology)

## 2022-11-09 NOTE — Telephone Encounter (Signed)
Returned call to patient and advised that we had to provide new Dupixent copay card information to pharmacy and they said another 7 business day turnaround. Advised that pharmacy will reach out to her directly with any issues  Knox Saliva, PharmD, MPH, BCPS, CPP Clinical Pharmacist (Rheumatology and Pulmonology)

## 2022-11-09 NOTE — Telephone Encounter (Signed)
Ret your call Md. Devki. Please call cell [phone]

## 2022-11-13 NOTE — Progress Notes (Signed)
Subjective:    Patient ID: Debra Byrd, female    DOB: Dec 02, 1945, 77 y.o.   MRN: [id number]  HPI Female never smoker followed for chronic obstructive asthma/Xolair/often steroid dependent, complicated by allergic rhinitis, peripheral neuropathy, glaucoma,  [place] Spirometry 08/08/2016-NL. FVC 2.04/77%, FEV1 1.64/81%, FEV1/FVC 0.80 HST 07/26/13- WNL  AHI 4.4/ hr, desaturation to 73%, body weight 175 lbs FeNO elevated --------------------------------------------------   04/12/22- 77 year old female never smoker followed for chronic obstructive asthma/Xolair/ often steroid dependent, complicated by allergic rhinitis, peripheral neuropathy, Glaucoma, Covid infection Feb 2022,  -Advair 100, pro-air HFA, Xolair, neb albuterol, benzonatate Covid vax-2 Phizer ACT score-23 -----Pt f/u, had her injection this morning and feels like she is doing well.  She continues to believe Xolair has made a huge difference in her quality of life with dramatic reduction in incidence of asthma exacerbation, systemic steroids and hospitalization.  She describes inability to give her own injections, poor eyesight and other reasons.  Concerns at infusion center. She feels increasing cough is time comes due for next Xolair injection.  Currently doing well.  Again asks standby prescription for prednisone and Biaxin to hold in case of bronchitis in the coming winter-discussed.  11/15/22- 77 year old female never smoker followed for chronic obstructive asthma/Xolair/ often steroid dependent, complicated by allergic rhinitis, peripheral neuropathy, Glaucoma, Covid infection Feb 2022, GERD,  -Advair 250, pro-air HFA, Xolair, neb albuterol, benzonatate Covid vax-2 Phizer Continuing Xolair until Dupixent is approved. Unable to self-inject- blames poor eyesight. LOV Cobb, NP 09/13/22- preop clearance for upper and lower endoscopy ------Pt states she is doing okay Was having increased cough earlier in the spring, much improved  now.  Sleeps with head elevated on a wedge.  Has had workup for aggressive reflux.  Did have endoscopy and is pending swallow evaluation and pH monitor. Had questions about RSV vaccine. Changing to Dupixent.  Has felt very dependent on Biologic therapy, starting with Xolair, for dramatic improvement in asthma control over the years. CXR 08/30/22-  IMPRESSION: Left basilar scar or atelectasis.  Mild peribronchial thickening  ROS-see HPI-+ = positive Constitutional:   No-   weight loss, night sweats, fevers, chills, +fatigue, lassitude. HEENT:   No-   difficulty swallowing, tooth/dental problems, sore throat,       No-  sneezing, itching, ear ache,  nasal congestion, post nasal drip,  CV:  + Noncardiac chest pain, no- orthopnea, PND, swelling in lower extremities,                                           anasarca, dizziness, palpitations Resp:+shortness of breath with exertion or at rest.               productive cough,   non-productive cough,  No- coughing up of blood.                change in color of mucus.  +Little wheezing.   Skin: No-   rash or lesions. GI:  No-   heartburn, indigestion, abdominal pain, nausea, vomiting,  GU: pain. MS:  +joint pain or swelling.   Neuro-     nothing unusualk pain. Psych:  No- change in mood or affect. No depression or anxiety.  No memory loss.   Objective:   Physical Exam              General- Alert, Oriented, Affect-appropriate, Distress-none acute, + overweight,  Skin-  rash-none, lesions- none, excoriation- none Lymphadenopathy- none Head- atraumatic            Eyes- Gross vision intact, PERRLA, conjunctivae clear secretions. Chronic periorbital edema.            Ears- Hearing, canals-normal            Nose- Clear, no-Septal dev, mucus, polyps, erosion, perforation             Throat- Mallampati II-III , mucosa clear , drainage- none, tonsils- atrophic,  Neck- flexible , trachea midline, no stridor , thyroid nl, carotid no bruit Chest -  symmetrical excursion , unlabored           Heart/CV- RRR , no murmur , no gallop  , no rub, nl s1 s2                           - JVD- none , edema- none, stasis changes- none, varices- none           Lung- clear, unlabored, dullness-none, rub- none,  Cough- none           Chest wall-  Abd-  Br/ Gen/ Rectal- Not done, not indicated Extrem- cyanosis- none, clubbing, none, atrophy- none, strength- nl Neuro- grossly intact to observation

## 2022-11-14 NOTE — Telephone Encounter (Signed)
Spoke with patient-  she states she received a [facility] to schedule shipment of [place]. Dupixent was covered. I advised her not to schedule as we have shipment in clinic that was never used and we are waiting to hear back on status update for re-billing claim from earlier this month. She states that she has not received update on this. I again reviewed that the billing dept at [facility] had requested 7 additional business day to re-process rx with copay card. She verbalized understanding  Knox Saliva, PharmD, MPH, BCPS, CPP Clinical Pharmacist (Rheumatology and Pulmonology)

## 2022-11-14 NOTE — Telephone Encounter (Signed)
ATC pt - daughter picked up and requested call back around 2:30p when pt would be back home from appt

## 2022-11-15 ENCOUNTER — Other Ambulatory Visit: Payer: Self-pay

## 2022-11-15 ENCOUNTER — Encounter: Payer: Self-pay | Admitting: Internal Medicine

## 2022-11-15 ENCOUNTER — Ambulatory Visit (INDEPENDENT_AMBULATORY_CARE_PROVIDER_SITE_OTHER): Payer: Medicare Other | Admitting: Internal Medicine

## 2022-11-15 VITALS — BP 124/70 | HR 55 | Ht 61.0 in | Wt 152.6 lb

## 2022-11-15 DIAGNOSIS — K219 Gastro-esophageal reflux disease without esophagitis: Secondary | ICD-10-CM

## 2022-11-15 DIAGNOSIS — J4540 Moderate persistent asthma, uncomplicated: Secondary | ICD-10-CM

## 2022-11-15 NOTE — Patient Instructions (Addendum)
Good luck with the reflux work-up  We can continue current meds.   Please call as needed  We can keep August 23 appointment.

## 2022-11-22 ENCOUNTER — Telehealth: Payer: Self-pay | Admitting: Internal Medicine

## 2022-11-22 NOTE — Telephone Encounter (Signed)
Pt wants update on dupixent order per prev encounter 11/14/2022

## 2022-11-23 NOTE — Telephone Encounter (Signed)
[facility]. They have successfully reprocessed rx for Dupixent and shipment that was receive.d Patient has no balance on her account. Patient's next injection is scheduled for 11/30/2022. She can be started on Dupixent with pharmacy-supplied medication at that visit.  Knox Saliva, PharmD, MPH, BCPS, CPP Clinical Pharmacist (Rheumatology and Pulmonology)

## 2022-11-23 NOTE — Telephone Encounter (Signed)
This is resolved and has been addressed in previous encounter  Knox Saliva, PharmD, MPH, BCPS, CPP Clinical Pharmacist (Rheumatology and Pulmonology)

## 2022-11-30 ENCOUNTER — Ambulatory Visit: Payer: Medicare Other

## 2022-12-02 ENCOUNTER — Other Ambulatory Visit: Payer: Self-pay

## 2022-12-06 DIAGNOSIS — K449 Diaphragmatic hernia without obstruction or gangrene: Secondary | ICD-10-CM | POA: Diagnosis not present

## 2022-12-06 DIAGNOSIS — K219 Gastro-esophageal reflux disease without esophagitis: Secondary | ICD-10-CM

## 2022-12-06 DIAGNOSIS — I10 Essential (primary) hypertension: Secondary | ICD-10-CM | POA: Diagnosis not present

## 2022-12-06 DIAGNOSIS — I2510 Atherosclerotic heart disease of native coronary artery without angina pectoris: Secondary | ICD-10-CM | POA: Diagnosis not present

## 2022-12-06 DIAGNOSIS — E7800 Pure hypercholesterolemia, unspecified: Secondary | ICD-10-CM

## 2022-12-06 DIAGNOSIS — J45909 Unspecified asthma, uncomplicated: Secondary | ICD-10-CM

## 2022-12-06 DIAGNOSIS — I712 Thoracic aortic aneurysm, without rupture, unspecified: Secondary | ICD-10-CM

## 2022-12-06 DIAGNOSIS — R079 Chest pain, unspecified: Secondary | ICD-10-CM | POA: Diagnosis not present

## 2022-12-07 ENCOUNTER — Ambulatory Visit (INDEPENDENT_AMBULATORY_CARE_PROVIDER_SITE_OTHER): Payer: Medicare Other

## 2022-12-07 VITALS — BP 121/81 | HR 66 | Temp 98.7°F | Resp 16

## 2022-12-07 DIAGNOSIS — J4540 Moderate persistent asthma, uncomplicated: Secondary | ICD-10-CM | POA: Diagnosis not present

## 2022-12-07 MED ORDER — DUPILUMAB 300 MG/2ML ~~LOC~~ SOSY
600.0000 mg | PREFILLED_SYRINGE | Freq: Once | SUBCUTANEOUS | Status: AC
  Administered 2022-12-07: 600 mg via SUBCUTANEOUS

## 2022-12-07 NOTE — Progress Notes (Signed)
Diagnosis: Asthma  Provider:  Chilton Greathouse MD  Procedure: Injection  Dupixent (Dupilumab), Dose: 600 mg, Site: subcutaneous, Number of injections: 2  Post Care: Observation period completed  Discharge: Condition: Good, Destination: Home . AVS Provided  Performed by:  Loney Hering, LPN

## 2022-12-08 NOTE — Telephone Encounter (Signed)
Patient received loading dose for Dupixent at Infusion Center on 12/07/22. Appears to have tolerated without issue. Routing to Dr.Young as FYI  F/u already scheduled with Dr. Maple Hudson on 04/14/23  Chesley Mires, PharmD, MPH, BCPS, CPP Clinical Pharmacist (Rheumatology and Pulmonology)

## 2022-12-09 ENCOUNTER — Ambulatory Visit: Payer: Medicare Other

## 2022-12-12 ENCOUNTER — Telehealth: Payer: Self-pay | Admitting: Cardiology

## 2022-12-12 NOTE — Telephone Encounter (Signed)
Patient states she had tests done at [facility] 04/15-04/16. A CT Scan and a stress test. Requesting someone reach out to get these results.   She would also like to discuss medication change.

## 2022-12-13 NOTE — Telephone Encounter (Signed)
Pt calling back for an update. Informed her we have have forwarded to RN and he will call as soon as he can.

## 2022-12-13 NOTE — Telephone Encounter (Signed)
Patient calling in for update. Please advise ?

## 2022-12-14 ENCOUNTER — Telehealth: Payer: Self-pay | Admitting: Internal Medicine

## 2022-12-14 ENCOUNTER — Other Ambulatory Visit: Payer: Self-pay

## 2022-12-14 DIAGNOSIS — J4541 Moderate persistent asthma with (acute) exacerbation: Secondary | ICD-10-CM

## 2022-12-14 MED ORDER — ALBUTEROL SULFATE HFA 108 (90 BASE) MCG/ACT IN AERS: 2.0000 | INHALATION_SPRAY | Freq: Four times a day (QID) | RESPIRATORY_TRACT | 6 refills | Status: DC | PRN

## 2022-12-14 MED ORDER — FLUTICASONE-SALMETEROL 250-50 MCG/ACT IN AEPB
1.0000 | INHALATION_SPRAY | Freq: Two times a day (BID) | RESPIRATORY_TRACT | 6 refills | Status: DC
Start: 2022-12-14 — End: 2023-03-06

## 2022-12-14 MED ORDER — LORATADINE 10 MG PO TABS: 10.0000 mg | ORAL_TABLET | Freq: Every day | ORAL | 4 refills | Status: DC

## 2022-12-14 MED ORDER — FLUTICASONE PROPIONATE 50 MCG/ACT NA SUSP: 2.0000 | Freq: Every day | NASAL | 5 refills | Status: DC

## 2022-12-14 MED ORDER — EPINEPHRINE 0.3 MG/0.3ML IJ SOAJ: 0.3000 mg | INTRAMUSCULAR | 5 refills | Status: AC | PRN

## 2022-12-14 MED ORDER — ALBUTEROL SULFATE (2.5 MG/3ML) 0.083% IN NEBU: 2.5000 mg | INHALATION_SOLUTION | Freq: Four times a day (QID) | RESPIRATORY_TRACT | 12 refills | Status: DC | PRN

## 2022-12-14 NOTE — Telephone Encounter (Signed)
Spoke with patient. Advised refills have been sent to pharmacy. NFN

## 2022-12-14 NOTE — Telephone Encounter (Signed)
Patient called to request that all her medications be sent to the [facility]. [employer].  The meds that she would like are:  Albuterol rescue inhaler Albuterol nebulizer solution Epipen Claritin Flonase Advair If there are any questions, please call patient at [phone] or [phone]

## 2022-12-15 NOTE — Telephone Encounter (Signed)
Patient called the office and asked the question below:  "Patient states she had tests done at [facility] 04/15-04/16. A CT Scan and a stress test. Requesting someone reach out to get these results.    She would also like to discuss medication change."  Spoke to Dr. Dulce Sellar and he recommended that we wait until the office visit on 12/23/22 to make any medication changes. I relayed this information to the patient and she had no further questions at this time.

## 2022-12-22 ENCOUNTER — Ambulatory Visit (INDEPENDENT_AMBULATORY_CARE_PROVIDER_SITE_OTHER): Payer: Medicare Other | Admitting: *Deleted

## 2022-12-22 VITALS — BP 127/80 | HR 53 | Temp 97.6°F | Resp 16 | Ht 61.0 in | Wt 154.8 lb

## 2022-12-22 DIAGNOSIS — J4540 Moderate persistent asthma, uncomplicated: Secondary | ICD-10-CM

## 2022-12-22 MED ORDER — DUPILUMAB 300 MG/2ML ~~LOC~~ SOSY
300.0000 mg | PREFILLED_SYRINGE | Freq: Once | SUBCUTANEOUS | Status: AC
  Administered 2022-12-22: 300 mg via SUBCUTANEOUS
  Filled 2022-12-22: qty 2

## 2022-12-22 NOTE — Progress Notes (Signed)
Diagnosis: Asthma  Provider:  Praveen Mannam MD  Procedure: Injection  Dupixent (Dupilumab), Dose: 300 mg, Site: subcutaneous, Number of injections: 1  Post Care: Observation period completed  Discharge: Condition: Good, Destination: Home . AVS Provided  Performed by:  Kazumi Lachney A, RN       

## 2022-12-22 NOTE — Progress Notes (Signed)
Cardiology Office Note:    Date:  12/23/2022   ID:  Debra Byrd, DOB 10-Feb-1946, MRN [id number]  PCP:  Leane Call, PA-C  Cardiologist:  Norman Herrlich, MD    Referring MD: Leane Call, PA-C    ASSESSMENT:    1. Chest pain, unspecified type   2. Essential hypertension   3. Mixed hyperlipidemia   4. APC (atrial premature contractions)    PLAN:    In order of problems listed above:  Her chest pain certainly is not acute coronary syndrome more consistent with esophageal and I think it is very appropriate to undergo full evaluation for diagnosis and treatment. Continue her current antihypertensive ARB trend record and blood pressures to the office With carotid disease continue antiplatelet clopidogrel along with her high intensity statin  Next appointment: 6 months   Medication Adjustments/Labs and Tests Ordered: Current medicines are reviewed at length with the patient today.  Concerns regarding medicines are outlined above.  No orders of the defined types were placed in this encounter.  Meds ordered this encounter  Medications   sucralfate (CARAFATE) 1 GM/10ML suspension    Sig: Take 10 mLs (1 g total) by mouth at bedtime.    Dispense:  50 mL    Refill:  5    Chief complaint Hospital follow-up many questions   History of Present Illness:    Debra Byrd is a 77 y.o. female with a hx of symptomatic APCs hypertension hyperlipidemia right internal carotid artery stenosis mild mitral regurgitation last seen 06/01/2022.  She was admitted to [facility] discharge 12/06/2022 with complaint of chest pain CBC was normal hemoglobin 12.9 platelets 350,000 creatinine 0.7 GFR greater than 60 cc BNP level was very low at 47 serial troponins were undetectable.  CT of the chest showed no finding of myocardial perfusion study showed mild coronary atherosclerosis ascending aorta was measured at 42 mm.  She had myocardial perfusion study which showed fixed defect EF 72%  and no ischemia she was seen by cardiology during her hospitalization.   Compliance with diet, lifestyle and medications: Yes  Recent admission at [facility] she is having severe reflux some chest discomfort present in the hospital no evidence of ACS and a perfusion study was normal for 1 month She is quite concerned that they noted coronary atherosclerosis on CT scan she has known carotid atherosclerotic disease and explained to her that it does not mean stenosis and she is on appropriate medical therapy He is quite concerned that he used to work aneurysm for ascending aorta was 42 mm and explained to her although it is aneurysmal dilated it is not a aneurysm she is on good medical treatment and I will recheck CT scan in 1 year Continues to have reflux symptoms is going to [facility] and sounds that she can have a full evaluation including endoscopic manometric's and pH monitoring. Following this I gave her prescription for Carafate that she could use once daily if ongoing symptoms at bedtime Past Medical History:  Diagnosis Date   Allergic rhinitis, cause unspecified    Asthma    Chronic airway obstruction, not elsewhere classified    Coronary atherosclerosis of unspecified type of vessel, native or graft    COVID-19 09/2020   Mitral valve disorders(424.0)    Mononeuritis of unspecified site    Myalgia and myositis, unspecified    Peripheral vascular disease, unspecified (HCC)    Sleep disturbance, unspecified    Unspecified glaucoma(365.9)     Past  Surgical History:  Procedure Laterality Date   ROTATOR CUFF REPAIR      Current Medications: Current Meds  Medication Sig   albuterol (PROAIR HFA) 108 (90 Base) MCG/ACT inhaler Inhale 2 puffs into the lungs every 6 (six) hours as needed for wheezing.   albuterol (PROVENTIL) (2.5 MG/3ML) 0.083% nebulizer solution Take 3 mLs (2.5 mg total) by nebulization 4 (four) times daily as needed for wheezing.   alclomethasone (ACLOVATE) 0.05 %  ointment Apply 1 application topically as needed (rash).   Ascorbic Acid (VITAMIN C) 1000 MG tablet Take 1,000 mg by mouth daily.   aspirin EC 81 MG tablet Take 81 mg by mouth daily. Swallow whole.   benzonatate (TESSALON) 200 MG capsule Take 1 capsule (200 mg total) by mouth 3 (three) times daily as needed for cough.   calcium gluconate 500 MG tablet Take 500 mg by mouth 2 (two) times daily.     cholecalciferol (VITAMIN D) 1000 UNITS tablet Take 3,000 Units by mouth daily.   clindamycin (CLEOCIN) 150 MG capsule Take 150 mg by mouth 4 (four) times daily. Prior to Dental Procedures   clopidogrel (PLAVIX) 75 MG tablet Take 1 tablet (75 mg total) by mouth daily.   Coenzyme Q10 (COQ10) 100 MG CAPS Take 1 capsule by mouth daily.   diclofenac sodium (VOLTAREN) 1 % GEL Apply 4 g topically 4 (four) times daily.   dupilumab (DUPIXENT) 300 MG/2ML prefilled syringe Inject 600mg  into the skin at Day 0 then 300mg  SQ every 14 days thereafter. Deliver syringes to infusion clinic   DUREZOL 0.05 % EMUL Apply 1 mL topically as needed (dry skin).   EPINEPHrine 0.3 mg/0.3 mL IJ SOAJ injection Inject 0.3 mg into the muscle as needed for anaphylaxis.   famotidine (PEPCID) 40 MG tablet Take 40 mg by mouth 2 (two) times daily.   ferrous gluconate (FERGON) 240 (27 FE) MG tablet Take by mouth.   finasteride (PROSCAR) 5 MG tablet Take 2.5 mg by mouth daily.   fish oil-omega-3 fatty acids 1000 MG capsule Take 1 g by mouth daily. 4 by mouth daily   fluticasone (FLONASE) 50 MCG/ACT nasal spray Place 2 sprays into both nostrils daily.   fluticasone-salmeterol (ADVAIR) 250-50 MCG/ACT AEPB Inhale 1 puff into the lungs in the morning and at bedtime.   gabapentin (NEURONTIN) 600 MG tablet Take 600 mg by mouth at bedtime.   Latanoprost (XELPROS) 0.005 % EMUL Place 1 drop into both eyes daily.   loratadine (CLARITIN) 10 MG tablet Take 1 tablet (10 mg total) by mouth daily.   meclizine (ANTIVERT) 12.5 MG tablet Take 12.5 mg by  mouth 3 (three) times daily as needed for dizziness.   methocarbamol (ROBAXIN) 500 MG tablet Take 500 mg by mouth daily.   Multiple Vitamins-Minerals (ZINC PO) Take 1 tablet by mouth daily. Unknown strength   nitroGLYCERIN (NITROSTAT) 0.4 MG SL tablet Place 0.4 mg under the tongue every 5 (five) minutes as needed for chest pain.   omeprazole (PRILOSEC) 40 MG capsule Take 40 mg by mouth in the morning and at bedtime.   potassium chloride (KLOR-CON) 10 MEQ tablet TAKE 1 TABLET BY MOUTH EVERY DAY   pyridOXINE (VITAMIN B-6) 25 MG tablet Take 25 mg by mouth daily.   RESTASIS 0.05 % ophthalmic emulsion Place 1 drop into both eyes 2 (two) times daily.   rosuvastatin (CRESTOR) 5 MG tablet Take 1 tablet (5 mg total) by mouth daily.   sucralfate (CARAFATE) 1 GM/10ML suspension Take 10 mLs (1  g total) by mouth at bedtime.   telmisartan (MICARDIS) 20 MG tablet Take 1 tablet (20 mg total) by mouth at bedtime.   vitamin B-12 (CYANOCOBALAMIN) 1000 MCG tablet Take 1,000 mcg by mouth daily.     Allergies:   Iodinated contrast media, Azithromycin, Iodine, Levofloxacin, Misc. sulfonamide containing compounds, Montelukast, Montelukast sodium, No known allergies, Cefaclor, Penicillins, Sulfa antibiotics, and Sulfonamide derivatives   Social History   Socioeconomic History   Marital status: Married    Spouse name: Not on file   Number of children: Not on file   Years of education: Not on file   Highest education level: Not on file  Occupational History   Not on file  Tobacco Use   Smoking status: Never    Passive exposure: Never   Smokeless tobacco: Never  Vaping Use   Vaping Use: Never used  Substance and Sexual Activity   Alcohol use: No   Drug use: No   Sexual activity: Not on file  Other Topics Concern   Not on file  Social History Narrative   Not on file   Social Determinants of Health   Financial Resource Strain: Not on file  Food Insecurity: Not on file  Transportation Needs: Not on  file  Physical Activity: Not on file  Stress: Not on file  Social Connections: Not on file     Family History: The patient's family history includes Emphysema in her maternal uncle; Heart disease in her father. ROS:   Please see the history of present illness.    All other systems reviewed and are negative.  EKGs/Labs/Other Studies Reviewed:    The following studies were reviewed today:  Cardiac Studies & Procedures     STRESS TESTS  MYOCARDIAL PERFUSION IMAGING 12/20/2022              Recent Labs: 06/01/2022: BUN 16; Creatinine, Ser 0.83; Potassium 4.3; Sodium 140 08/30/2022: Hemoglobin 13.0; Platelets 306.0  Recent Lipid Panel    Component Value Date/Time   CHOL 145 08/08/2018 0940   TRIG 85 08/08/2018 0940   HDL 67 08/08/2018 0940   CHOLHDL 2.2 08/08/2018 0940   LDLCALC 61 08/08/2018 0940    Physical Exam:    VS:  BP (!) 154/62 (BP Location: Left Arm, Patient Position: Sitting, Cuff Size: Normal)   Pulse 60   Ht 5\' 1"  (1.549 m)   Wt 155 lb 6.4 oz (70.5 kg)   SpO2 97%   BMI 29.36 kg/m     Wt Readings from Last 3 Encounters:  12/23/22 155 lb 6.4 oz (70.5 kg)  12/22/22 154 lb 12.8 oz (70.2 kg)  11/15/22 152 lb 9.6 oz (69.2 kg)     GEN:  Well nourished, well developed in no acute distress HEENT: Normal NECK: No JVD; No carotid bruits LYMPHATICS: No lymphadenopathy CARDIAC: RRR, no murmurs, rubs, gallops RESPIRATORY:  Clear to auscultation without rales, wheezing or rhonchi  ABDOMEN: Soft, non-tender, non-distended MUSCULOSKELETAL:  No edema; No deformity  SKIN: Warm and dry NEUROLOGIC:  Alert and oriented x 3 PSYCHIATRIC:  Normal affect    Signed, Norman Herrlich, MD  12/23/2022 2:41 PM    [facility]

## 2022-12-23 ENCOUNTER — Encounter: Payer: Self-pay | Admitting: Cardiology

## 2022-12-23 ENCOUNTER — Ambulatory Visit: Payer: Medicare Other | Attending: Cardiology | Admitting: Cardiology

## 2022-12-23 VITALS — BP 154/62 | HR 60 | Ht 61.0 in | Wt 155.4 lb

## 2022-12-23 DIAGNOSIS — R079 Chest pain, unspecified: Secondary | ICD-10-CM | POA: Diagnosis present

## 2022-12-23 DIAGNOSIS — E782 Mixed hyperlipidemia: Secondary | ICD-10-CM | POA: Diagnosis present

## 2022-12-23 DIAGNOSIS — I491 Atrial premature depolarization: Secondary | ICD-10-CM | POA: Insufficient documentation

## 2022-12-23 DIAGNOSIS — I10 Essential (primary) hypertension: Secondary | ICD-10-CM | POA: Diagnosis present

## 2022-12-23 MED ORDER — SUCRALFATE 1 GM/10ML PO SUSP: 1.0000 g | Freq: Every day | ORAL | 5 refills | Status: DC

## 2022-12-23 NOTE — Patient Instructions (Addendum)
Medication Instructions:  Your physician has recommended you make the following change in your medication:   START: Carafate suspension 10 ml use at bedtime daily.  *If you need a refill on your cardiac medications before your next appointment, please call your pharmacy*   Lab Work: None If you have labs (blood work) drawn today and your tests are completely normal, you will receive your results only by: MyChart Message (if you have MyChart) OR A paper copy in the mail If you have any lab test that is abnormal or we need to change your treatment, we will call you to review the results.   Testing/Procedures: None   Follow-Up: At [facility], you and your health needs are our priority.  As part of our continuing mission to provide you with exceptional heart care, we have created designated Provider Care Teams.  These Care Teams include your primary Cardiologist (physician) and Advanced Practice Providers (APPs -  Physician Assistants and Nurse Practitioners) who all work together to provide you with the care you need, when you need it.  We recommend signing up for the patient portal called "MyChart".  Sign up information is provided on this After Visit Summary.  MyChart is used to connect with patients for Virtual Visits (Telemedicine).  Patients are able to view lab/test results, encounter notes, upcoming appointments, etc.  Non-urgent messages can be sent to your provider as well.   To learn more about what you can do with MyChart, go to [email or web address].    Your next appointment:   6 month(s)  Provider:   Norman Herrlich, MD    Other Instructions None

## 2022-12-25 NOTE — Assessment & Plan Note (Signed)
Changing from Xolair to Dupixent for insurance.  Continues with much better control on Biologic therapy.

## 2022-12-25 NOTE — Assessment & Plan Note (Signed)
Having active evaluation by GI.

## 2022-12-26 ENCOUNTER — Ambulatory Visit (INDEPENDENT_AMBULATORY_CARE_PROVIDER_SITE_OTHER): Payer: Medicare Other | Admitting: Podiatry

## 2022-12-26 ENCOUNTER — Other Ambulatory Visit: Payer: Self-pay | Admitting: Internal Medicine

## 2022-12-26 DIAGNOSIS — M79674 Pain in right toe(s): Secondary | ICD-10-CM

## 2022-12-26 DIAGNOSIS — M79675 Pain in left toe(s): Secondary | ICD-10-CM | POA: Diagnosis not present

## 2022-12-26 DIAGNOSIS — B351 Tinea unguium: Secondary | ICD-10-CM | POA: Diagnosis not present

## 2022-12-26 DIAGNOSIS — D689 Coagulation defect, unspecified: Secondary | ICD-10-CM

## 2022-12-26 DIAGNOSIS — J4540 Moderate persistent asthma, uncomplicated: Secondary | ICD-10-CM

## 2022-12-26 NOTE — Progress Notes (Signed)
  Subjective:  Patient ID: Debra Byrd, female    DOB: 1945-12-06,  MRN: [id number]  Chief Complaint  Patient presents with   Nail Problem    Routine Foot Care     77 y.o. female presents with the above complaint. History confirmed with patient. Patient presenting with pain related to dystrophic thickened elongated nails. Patient is unable to trim own nails related to nail dystrophy and/or mobility issues. Patient does not have a history of T2DM.  Patient does have calluses present bilateral foot 2 on the left and 1 on the right that cause pain with pressure  Objective:  Physical Exam: warm, good capillary refill nail exam onychomycosis of the toenails, onycholysis, and dystrophic nails DP pulses palpable, PT pulses palpable, and protective sensation intact Left Foot:  Pain with palpation of nails due to elongation and dystrophic growth.  Hyperkeratotic lesion subsecond metatarsal head as well as medial aspect of the first metatarsophalangeal joint Right Foot: Pain with palpation of nails due to elongation and dystrophic growth.  Hyperkeratotic lesion at the medial aspect of the first MPJ  Assessment:   1. Pain due to onychomycosis of toenails of both feet   2. 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  #Hyperkeratotic lesions/pre ulcerative calluses present bilateral 1st MPJ medially and sub 2nd Met head left foot All symptomatic hyperkeratoses x 3 separate lesions were safely debrided with a sterile #10 blade to patient's level of comfort without incident. We discussed preventative and palliative care of these lesions including supportive and accommodative shoegear, padding, prefabricated and custom molded accommodative orthoses, use of a pumice stone and lotions/creams daily.  #Onychomycosis with pain  -Nails palliatively debrided as below. -Educated on self-care  Procedure: Nail Debridement Rationale: Pain Type of  Debridement: manual, sharp debridement. Instrumentation: Nail nipper, rotary burr. Number of Nails: 10  Return in about 3 months (around 03/28/2023) for RFC.         Corinna Gab, DPM [facility] / [facility]

## 2022-12-27 NOTE — Telephone Encounter (Signed)
R/f for Dupixent pFS sent to [facility] with note to deliver to [employer] and address  Chesley Mires, PharmD, MPH, BCPS, CPP Clinical Pharmacist (Rheumatology and Pulmonology)

## 2023-01-05 ENCOUNTER — Ambulatory Visit (INDEPENDENT_AMBULATORY_CARE_PROVIDER_SITE_OTHER): Payer: Medicare Other | Admitting: *Deleted

## 2023-01-05 VITALS — BP 129/84 | HR 54 | Temp 97.8°F | Resp 16 | Ht 61.0 in | Wt 157.8 lb

## 2023-01-05 DIAGNOSIS — J4540 Moderate persistent asthma, uncomplicated: Secondary | ICD-10-CM

## 2023-01-05 MED ORDER — DUPILUMAB 300 MG/2ML ~~LOC~~ SOSY
300.0000 mg | PREFILLED_SYRINGE | Freq: Once | SUBCUTANEOUS | Status: AC
  Administered 2023-01-05: 300 mg via SUBCUTANEOUS
  Filled 2023-01-05: qty 2

## 2023-01-05 NOTE — Progress Notes (Signed)
Diagnosis: Asthma  Provider:  Chilton Greathouse MD  Procedure: Injection  Dupixent (Dupilumab), Dose: 300 mg, Site: subcutaneous, Number of injections: 1  Post Care: Observation period completed  Discharge: Condition: Good, Destination: Home . AVS Declined  Performed by:  Forrest Moron, RN

## 2023-01-19 ENCOUNTER — Ambulatory Visit (INDEPENDENT_AMBULATORY_CARE_PROVIDER_SITE_OTHER): Payer: Medicare Other

## 2023-01-19 ENCOUNTER — Telehealth: Payer: Self-pay | Admitting: *Deleted

## 2023-01-19 ENCOUNTER — Telehealth: Payer: Self-pay | Admitting: Cardiology

## 2023-01-19 VITALS — BP 123/55 | HR 58 | Temp 98.3°F | Resp 16 | Ht 61.0 in | Wt 155.8 lb

## 2023-01-19 DIAGNOSIS — J4540 Moderate persistent asthma, uncomplicated: Secondary | ICD-10-CM | POA: Diagnosis not present

## 2023-01-19 MED ORDER — DUPILUMAB 300 MG/2ML ~~LOC~~ SOSY
300.0000 mg | PREFILLED_SYRINGE | Freq: Once | SUBCUTANEOUS | Status: AC
  Administered 2023-01-19: 300 mg via SUBCUTANEOUS
  Filled 2023-01-19: qty 2

## 2023-01-19 NOTE — Progress Notes (Signed)
Diagnosis: Asthma  Provider:  Mannam, Praveen MD  Procedure: Injection  Dupixent (Dupilumab), Dose: 300 mg, Site: subcutaneous, Number of injections: 1  Post Care:  Patient Discharged  Discharge: Condition: Good, Destination: Home . AVS Declined  Performed by:  Paytience Bures A Jaedan Huttner, RN       

## 2023-01-19 NOTE — Telephone Encounter (Signed)
     Primary Cardiologist: Norman Herrlich, MD  Chart reviewed as part of pre-operative protocol coverage. Given past medical history and time since last visit, based on ACC/AHA guidelines, Debra Byrd would be at acceptable risk for the planned procedure without further cardiovascular testing.   Her aspirin and Plavix may be held for 5-7 days prior to her procedure.  Please resume as soon as hemostasis is achieved.  I will route this recommendation to the requesting party via Epic fax function and remove from pre-op pool.  Please call with questions.  Thomasene Ripple. Jodie Leiner NP-C     01/19/2023, 3:12 PM [facility] [address] Office [phone] Fax [phone]

## 2023-01-19 NOTE — Telephone Encounter (Signed)
   Pre-operative Risk Assessment    Patient Name: Debra Byrd  DOB: Jan 29, 1946 MRN: [id number]      Request for Surgical Clearance    Procedure:   REPAIR VAGINAL VAULT PROLAPSE  Date of Surgery:  Clearance 02/07/23                                 Surgeon:  DR. Judson Roch Surgeon's Group or Practice Name:  [facility] Phone number:  [phone] Fax number:  [phone]   Type of Clearance Requested:   - Medical  - Pharmacy:  Hold Aspirin and Clopidogrel (Plavix)     Type of Anesthesia:  Not Indicated (GENERAL?)   Additional requests/questions:    [name]   01/19/2023, 2:33 PM

## 2023-01-19 NOTE — Telephone Encounter (Signed)
[facility] to [facility] per request for Pre op

## 2023-01-19 NOTE — Telephone Encounter (Signed)
Debra Byrd with [facility]   Pt Surgery is June the 18th.  They are doing preop work up right now and they need 12/05/22 EKG sent over to them   Fax [phone]

## 2023-02-02 ENCOUNTER — Ambulatory Visit: Payer: Medicare Other

## 2023-02-02 ENCOUNTER — Ambulatory Visit (INDEPENDENT_AMBULATORY_CARE_PROVIDER_SITE_OTHER): Payer: Medicare Other

## 2023-02-02 VITALS — BP 116/70 | HR 56 | Temp 98.1°F | Resp 17 | Ht 61.0 in | Wt 159.2 lb

## 2023-02-02 DIAGNOSIS — J4540 Moderate persistent asthma, uncomplicated: Secondary | ICD-10-CM | POA: Diagnosis not present

## 2023-02-02 MED ORDER — DUPILUMAB 300 MG/2ML ~~LOC~~ SOSY
300.0000 mg | PREFILLED_SYRINGE | Freq: Once | SUBCUTANEOUS | Status: AC
  Administered 2023-02-02: 300 mg via SUBCUTANEOUS

## 2023-02-02 NOTE — Progress Notes (Signed)
Diagnosis: Asthma  Provider:  Chilton Greathouse MD  Procedure: Injection  Dupixent (Dupilumab), Dose: 300 mg, Site: subcutaneous, Number of injections: 1   Discharge: Condition: Good, Destination: Home . AVS Declined  Performed by:  Nat Math, RN

## 2023-02-16 ENCOUNTER — Ambulatory Visit (INDEPENDENT_AMBULATORY_CARE_PROVIDER_SITE_OTHER): Payer: Medicare Other

## 2023-02-16 VITALS — BP 122/60 | HR 58 | Temp 97.5°F | Resp 20 | Ht 61.0 in | Wt 156.0 lb

## 2023-02-16 DIAGNOSIS — J4540 Moderate persistent asthma, uncomplicated: Secondary | ICD-10-CM | POA: Diagnosis not present

## 2023-02-16 MED ORDER — DUPILUMAB 300 MG/2ML ~~LOC~~ SOSY
300.0000 mg | PREFILLED_SYRINGE | Freq: Once | SUBCUTANEOUS | Status: AC
  Administered 2023-02-16: 300 mg via SUBCUTANEOUS
  Filled 2023-02-16: qty 2

## 2023-02-16 NOTE — Progress Notes (Signed)
Diagnosis: Asthma  Provider:  Mannam, Praveen MD  Procedure: Injection  Dupixent (Dupilumab), Dose: 300 mg, Site: subcutaneous, Number of injections: 1  Post Care: Patient declined observation  Discharge: Condition: Good, Destination: Home . AVS Provided  Performed by:  Jamisen Duerson G Pilkington-Burchett, RN       

## 2023-02-27 ENCOUNTER — Ambulatory Visit (INDEPENDENT_AMBULATORY_CARE_PROVIDER_SITE_OTHER): Payer: Medicare Other | Admitting: Podiatry

## 2023-02-27 DIAGNOSIS — M7661 Achilles tendinitis, right leg: Secondary | ICD-10-CM | POA: Diagnosis not present

## 2023-02-27 DIAGNOSIS — M7662 Achilles tendinitis, left leg: Secondary | ICD-10-CM | POA: Diagnosis not present

## 2023-02-27 NOTE — Progress Notes (Signed)
Subjective:  Patient ID: Debra Byrd, female    DOB: 08-20-1946,  MRN: [id number]  Chief Complaint  Patient presents with   Foot Pain    Bilateral achilles pain. Pain only occurs when she applying pressure to that area;example, like sitting on the recliner, wearing sneakers or laying in bed. If patient is using her sandals it does not hurt. Right is worst than left. Started a couple of weeks ago.     77 y.o. female presents with concern for pain at the posterior aspect of the heel bilaterally.  She says he is pain along her Achilles tendons especially when applying pressure to the area and walking.  Started a few weeks ago.  Past Medical History:  Diagnosis Date   Allergic rhinitis, cause unspecified    Asthma    Chronic airway obstruction, not elsewhere classified    Coronary atherosclerosis of unspecified type of vessel, native or graft    COVID-19 09/2020   Mitral valve disorders(424.0)    Mononeuritis of unspecified site    Myalgia and myositis, unspecified    Peripheral vascular disease, unspecified (HCC)    Sleep disturbance, unspecified    Unspecified glaucoma(365.9)     Allergies  Allergen Reactions   Iodinated Contrast Media Shortness Of Breath and Swelling    Swelling in facial/No issues with shellfish    Azithromycin Hives    No problem with Biaxin   Iodine Swelling   Levofloxacin     Tendon tears   Misc. Sulfonamide Containing Compounds    Montelukast Other (See Comments)   Montelukast Sodium Swelling   No Known Allergies Other (See Comments)   Cefaclor Rash   Penicillins Swelling   Sulfa Antibiotics Rash   Sulfonamide Derivatives Rash    ROS: Negative except as per HPI above  Objective:  General: AAO x3, NAD  Dermatological: With inspection and palpation of the right and left lower extremities there are no open sores, no preulcerative lesions, no rash or signs of infection present. Nails are of normal length thickness and coloration.   Vascular:   Dorsalis Pedis artery and Posterior Tibial artery pedal pulses are 2/4 bilateral.  Capillary fill time < 3 sec to all digits.   Neruologic: Grossly intact via light touch bilateral. Protective threshold intact to all sites bilateral.   Musculoskeletal: Pain along the posterior aspect of the heel bilateral early along the mid substance of the Achilles tendon just proximal to the insertion point.  Pain increased with ankle dorsiflexion range of motion  Gait: Unassisted, Nonantalgic.   No images are attached to the encounter.  Radiographs:  deferred Assessment:   1. Achilles tendinitis of both lower extremities      Plan:  Patient was evaluated and treated and all questions answered.  # Bilateral Achilles tendinitis -Discussed with the patient that I recommend treatment with heel lifts for her shoes and provided her with these -Also provided patient with stretching exercises and discussed with her in some daily stretching routine -Offered steroid Dosepak however patient wants to hold off on this until next appointment as she recently had some other surgery work done approximately 20 days ago and does not want to have steroids interfere with that -Recommend icing and anti-inflammatory medication such as ibuprofen as needed for pain  Return for as previously scheduled in 1 mo.          Corinna Gab, DPM [facility] / [facility]

## 2023-02-27 NOTE — Patient Instructions (Signed)
Achilles Tendinitis  with Rehab Achilles tendinitis is a disorder of the Achilles tendon. The Achilles tendon connects the large calf muscles (Gastrocnemius and Soleus) to the heel bone (calcaneus). This tendon is sometimes called the heel cord. It is important for pushing-off and standing on your toes and is important for walking, running, or jumping. Tendinitis is often caused by overuse and repetitive microtrauma. SYMPTOMS  Pain, tenderness, swelling, warmth, and redness may occur over the Achilles tendon even at rest.  Pain with pushing off, or flexing or extending the ankle.  Pain that is worsened after or during activity. CAUSES   Overuse sometimes seen with rapid increase in exercise programs or in sports requiring running and jumping.  Poor physical conditioning (strength and flexibility or endurance).  Running sports, especially training running down hills.  Inadequate warm-up before practice or play or failure to stretch before participation.  Injury to the tendon. PREVENTION   Warm up and stretch before practice or competition.  Allow time for adequate rest and recovery between practices and competition.  Keep up conditioning.  Keep up ankle and leg flexibility.  Improve or keep muscle strength and endurance.  Improve cardiovascular fitness.  Use proper technique.  Use proper equipment (shoes, skates).  To help prevent recurrence, taping, protective strapping, or an adhesive bandage may be recommended for several weeks after healing is complete. PROGNOSIS   Recovery may take weeks to several months to heal.  Longer recovery is expected if symptoms have been prolonged.  Recovery is usually quicker if the inflammation is due to a direct blow as compared with overuse or sudden strain. RELATED COMPLICATIONS   Healing time will be prolonged if the condition is not correctly treated. The injury must be given plenty of time to heal.  Symptoms can reoccur if  activity is resumed too soon.  Untreated, tendinitis may increase the risk of tendon rupture requiring additional time for recovery and possibly surgery. TREATMENT   The first treatment consists of rest anti-inflammatory medication, and ice to relieve the pain.  Stretching and strengthening exercises after resolution of pain will likely help reduce the risk of recurrence. Referral to a physical therapist or athletic trainer for further evaluation and treatment may be helpful.  A walking boot or cast may be recommended to rest the Achilles tendon. This can help break the cycle of inflammation and microtrauma.  Arch supports (orthotics) may be prescribed or recommended by your caregiver as an adjunct to therapy and rest.  Surgery to remove the inflamed tendon lining or degenerated tendon tissue is rarely necessary and has shown less than predictable results. MEDICATION   Nonsteroidal anti-inflammatory medications, such as aspirin and ibuprofen, may be used for pain and inflammation relief. Do not take within 7 days before surgery. Take these as directed by your caregiver. Contact your caregiver immediately if any bleeding, stomach upset, or signs of allergic reaction occur. Other minor pain relievers, such as acetaminophen, may also be used.  Pain relievers may be prescribed as necessary by your caregiver. Do not take prescription pain medication for longer than 4 to 7 days. Use only as directed and only as much as you need. HEAT AND COLD  Cold is used to relieve pain and reduce inflammation for acute and chronic Achilles tendinitis. Cold should be applied for 10 to 15 minutes every 2 to 3 hours for inflammation and pain and immediately after any activity that aggravates your symptoms. Use ice packs or an ice massage.  Heat may be used   before performing stretching and strengthening activities prescribed by your caregiver. Use a heat pack or a warm soak. SEEK MEDICAL CARE IF:  Symptoms get  worse or do not improve in 2 weeks despite treatment.  New, unexplained symptoms develop. Drugs used in treatment may produce side effects.   EXERCISES-- hold each stretch for 30 seconds and repeat 10 times.  Complete each stretch 3 times per day.   RANGE OF MOTION (ROM) AND STRETCHING EXERCISES - Achilles Tendinitis  These exercises may help you when beginning to rehabilitate your injury. Your symptoms may resolve with or without further involvement from your physician, physical therapist or athletic trainer. While completing these exercises, remember:   Restoring tissue flexibility helps normal motion to return to the joints. This allows healthier, less painful movement and activity.  An effective stretch should be held for at least 30 seconds.  A stretch should never be painful. You should only feel a gentle lengthening or release in the stretched tissue. STRETCH  Gastroc, Standing   Place hands on wall.  Extend right / left leg, keeping the front knee somewhat bent.  Slightly point your toes inward on your back foot.  Keeping your right / left heel on the floor and your knee straight, shift your weight toward the wall, not allowing your back to arch.  You should feel a gentle stretch in the right / left calf. Hold this position for __________ seconds. Repeat __________ times. Complete this stretch __________ times per day. STRETCH  Soleus, Standing   Place hands on wall.  Extend right / left leg, keeping the other knee somewhat bent.  Slightly point your toes inward on your back foot.  Keep your right / left heel on the floor, bend your back knee, and slightly shift your weight over the back leg so that you feel a gentle stretch deep in your back calf.  Hold this position for __________ seconds. Repeat __________ times. Complete this stretch __________ times per day. STRETCH  Gastrocsoleus, Standing  Note: This exercise can place a lot of stress on your foot and ankle.  Please complete this exercise only if specifically instructed by your caregiver.   Place the ball of your right / left foot on a step, keeping your other foot firmly on the same step.  Hold on to the wall or a rail for balance.  Slowly lift your other foot, allowing your body weight to press your heel down over the edge of the step.  You should feel a stretch in your right / left calf.  Hold this position for __________ seconds.  Repeat this exercise with a slight bend in your knee. Repeat __________ times. Complete this stretch __________ times per day.  STRENGTHENING EXERCISES - Achilles Tendinitis These exercises may help you when beginning to rehabilitate your injury. They may resolve your symptoms with or without further involvement from your physician, physical therapist or athletic trainer. While completing these exercises, remember:   Muscles can gain both the endurance and the strength needed for everyday activities through controlled exercises.  Complete these exercises as instructed by your physician, physical therapist or athletic trainer. Progress the resistance and repetitions only as guided.  You may experience muscle soreness or fatigue, but the pain or discomfort you are trying to eliminate should never worsen during these exercises. If this pain does worsen, stop and make certain you are following the directions exactly. If the pain is still present after adjustments, discontinue the exercise until you can discuss   the trouble with your clinician. STRENGTH - Plantar-flexors   Sit with your right / left leg extended. Holding onto both ends of a rubber exercise band/tubing, loop it around the ball of your foot. Keep a slight tension in the band.  Slowly push your toes away from you, pointing them downward.  Hold this position for __________ seconds. Return slowly, controlling the tension in the band/tubing. Repeat __________ times. Complete this exercise __________ times  per day.  STRENGTH - Plantar-flexors   Stand with your feet shoulder width apart. Steady yourself with a wall or table using as little support as needed.  Keeping your weight evenly spread over the width of your feet, rise up on your toes.*  Hold this position for __________ seconds. Repeat __________ times. Complete this exercise __________ times per day.  *If this is too easy, shift your weight toward your right / left leg until you feel challenged. Ultimately, you may be asked to do this exercise with your right / left foot only. STRENGTH  Plantar-flexors, Eccentric  Note: This exercise can place a lot of stress on your foot and ankle. Please complete this exercise only if specifically instructed by your caregiver.   Place the balls of your feet on a step. With your hands, use only enough support from a wall or rail to keep your balance.  Keep your knees straight and rise up on your toes.  Slowly shift your weight entirely to your right / left toes and pick up your opposite foot. Gently and with controlled movement, lower your weight through your right / left foot so that your heel drops below the level of the step. You will feel a slight stretch in the back of your calf at the end position.  Use the healthy leg to help rise up onto the balls of both feet, then lower weight only on the right / left leg again. Build up to 15 repetitions. Then progress to 3 consecutive sets of 15 repetitions.*  After completing the above exercise, complete the same exercise with a slight knee bend (about 30 degrees). Again, build up to 15 repetitions. Then progress to 3 consecutive sets of 15 repetitions.* Perform this exercise __________ times per day.  *When you easily complete 3 sets of 15, your physician, physical therapist or athletic trainer may advise you to add resistance by wearing a backpack filled with additional weight. STRENGTH - Plantar Flexors, Seated   Sit on a chair that allows your feet  to rest flat on the ground. If necessary, sit at the edge of the chair.  Keeping your toes firmly on the ground, lift your right / left heel as far as you can without increasing any discomfort in your ankle. Repeat __________ times. Complete this exercise __________ times a day. *If instructed by your physician, physical therapist or athletic trainer, you may add ____________________ of resistance by placing a weighted object on your right / left knee. Document Released: 03/09/2005 Document Revised: 10/31/2011 Document Reviewed: 11/20/2008 ExitCare Patient Information 2014 [employer].    

## 2023-03-01 ENCOUNTER — Telehealth: Payer: Self-pay | Admitting: Student-PharmD

## 2023-03-01 NOTE — Telephone Encounter (Signed)
Submitted a Prior Authorization request to [facility] for DUPIXENT via Fax. Will update once we receive a response.  Fax: [phone] Phone: [phone] Case Number: [id number]  Rickey Primus, PharmD Candidate [employer] 20[employer]

## 2023-03-02 ENCOUNTER — Ambulatory Visit (INDEPENDENT_AMBULATORY_CARE_PROVIDER_SITE_OTHER): Payer: Medicare Other

## 2023-03-02 VITALS — BP 123/67 | HR 60 | Temp 97.5°F | Resp 18 | Ht 61.0 in | Wt 157.6 lb

## 2023-03-02 DIAGNOSIS — J4540 Moderate persistent asthma, uncomplicated: Secondary | ICD-10-CM | POA: Diagnosis not present

## 2023-03-02 MED ORDER — DUPILUMAB 300 MG/2ML ~~LOC~~ SOSY
300.0000 mg | PREFILLED_SYRINGE | Freq: Once | SUBCUTANEOUS | Status: AC
  Administered 2023-03-02: 300 mg via SUBCUTANEOUS
  Filled 2023-03-02: qty 2

## 2023-03-02 NOTE — Telephone Encounter (Signed)
Debra Byrd is booked out as are the Apps. He has hold spots, but they require approval from he and his nurse to use.

## 2023-03-02 NOTE — Telephone Encounter (Signed)
Spoke with patient's daughter Roanna Raider regarding scheduling patient for office visit to approval for Dupixent . 03/06/23 patient will come in.  Sherri's voice was understanding.Nothing else further needed at this time.

## 2023-03-02 NOTE — Progress Notes (Signed)
Diagnosis: Asthma  Provider:  Chilton Greathouse MD  Procedure: Injection  Dupixent (Dupilumab), Dose: 300 mg, Site: subcutaneous, Number of injections: 1  Post Care:     Discharge: Condition: Good, Destination: Home . AVS Provided  Performed by:  Garnette Czech, RN

## 2023-03-02 NOTE — Telephone Encounter (Signed)
PA for Dupixent denied because patient has not been seen since starting Dupixent and we do not have chart notes indicating improvement in asthma control since switching from Xolair. Her appt with Dr. Maple Hudson is not until 04/14/23. Routing to front desk to assist in having patient scheduled sooner if possible so we have updated notes  Chesley Mires, PharmD, MPH, BCPS, CPP Clinical Pharmacist (Rheumatology and Pulmonology)

## 2023-03-04 NOTE — Progress Notes (Signed)
Subjective:    Patient ID: Debra Byrd, female    DOB: [birth year], 77 y.o.   MRN: [id number]  HPI Female never smoker followed for chronic obstructive asthma/Xolair/often steroid dependent, complicated by allergic rhinitis, peripheral neuropathy, glaucoma,  Offce Spirometry 08/08/2016-NL. FVC 2.04/77%, FEV1 1.64/81%, FEV1/FVC 0.80 HST 07/26/13- WNL  AHI 4.4/ hr, desaturation to 73%, body weight 175 lbs FeNO elevated =============================================================   11/15/22- 77 year old female never smoker followed for chronic obstructive asthma/Xolair/ often steroid dependent, complicated by allergic rhinitis, peripheral neuropathy, Glaucoma, Covid infection Feb 2022, GERD,  -Advair 250, pro-air HFA, Xolair, neb albuterol, benzonatate Covid vax-2 [employer] Continuing Xolair until Dupixent is approved. Unable to self-inject- blames poor eyesight. LOV Cobb, NP 09/13/22- preop clearance for upper and lower endoscopy ------Pt states she is doing okay Was having increased cough earlier in the spring, much improved now.  Sleeps with head elevated on a wedge.  Has had workup for aggressive reflux.  Did have endoscopy and is pending swallow evaluation and pH monitor. Had questions about RSV vaccine. Changing to Dupixent.  Has felt very dependent on Biologic therapy, starting with Xolair, for dramatic improvement in asthma control over the years. CXR 08/30/22-  IMPRESSION: Left basilar scar or atelectasis.  Mild peribronchial thickening  03/06/23- 77 year old female never smoker followed for chronic obstructive asthma/Xolair/ often steroid dependent, complicated by allergic rhinitis, peripheral neuropathy, Glaucoma, Covid infection Feb 2022, GERD,  -Wixela 250, pro-air HFA, Dupixent, neb albuterol, benzonatate  -----Breathing has been ok   ACT 19 She does not notice any real difference between Xolair and Dupixent, except that nasal stuffiness seemed to clear faster after Xolair injection.   Both have definitely improved control of her asthma.  Much acid reflux being followed by GI.  ROS-see HPI-+ = positive Constitutional:   No-   weight loss, night sweats, fevers, chills, +fatigue, lassitude. HEENT:   No-   difficulty swallowing, tooth/dental problems, sore throat,       No-  sneezing, itching, ear ache,  nasal congestion, post nasal drip,  CV:  + Noncardiac chest pain, no- orthopnea, PND, swelling in lower extremities,                                           anasarca, dizziness, palpitations Resp:+shortness of breath with exertion or at rest.               productive cough,   non-productive cough,  No- coughing up of blood.                change in color of mucus.  +Little wheezing.   Skin: No-   rash or lesions. GI:  No-   heartburn, indigestion, abdominal pain, nausea, vomiting,  GU: pain. MS:  +joint pain or swelling.   Neuro-     nothing unusualk pain. Psych:  No- change in mood or affect. No depression or anxiety.  No memory loss.   Objective:   Physical Exam              General- Alert, Oriented, Affect-appropriate, Distress-none acute, + overweight,  Skin- rash-none, lesions- none, excoriation- none Lymphadenopathy- none Head- atraumatic            Eyes- Gross vision intact, PERRLA, conjunctivae clear secretions. Chronic periorbital edema.            Ears- Hearing, canals-normal  Nose- Clear, no-Septal dev, mucus, polyps, erosion, perforation             Throat- Mallampati II-III , mucosa clear , drainage- none, tonsils- atrophic,  Neck- flexible , trachea midline, no stridor , thyroid nl, carotid no bruit Chest - symmetrical excursion , unlabored           Heart/CV- RRR , no murmur , no gallop  , no rub, nl s1 s2                           - JVD- none , edema- none, stasis changes- none, varices- none           Lung- clear, unlabored, dullness-none, rub- none,  Cough- none           Chest wall-  Abd-  Br/ Gen/ Rectal- Not done, not  indicated Extrem- cyanosis- none, clubbing, none, atrophy- none, strength- nl Neuro- grossly intact to observation

## 2023-03-06 ENCOUNTER — Encounter: Payer: Self-pay | Admitting: Internal Medicine

## 2023-03-06 ENCOUNTER — Ambulatory Visit: Payer: Medicare Other | Admitting: Internal Medicine

## 2023-03-06 VITALS — BP 124/64 | HR 56 | Ht 61.0 in | Wt 157.4 lb

## 2023-03-06 DIAGNOSIS — K219 Gastro-esophageal reflux disease without esophagitis: Secondary | ICD-10-CM | POA: Diagnosis not present

## 2023-03-06 DIAGNOSIS — J4541 Moderate persistent asthma with (acute) exacerbation: Secondary | ICD-10-CM | POA: Diagnosis not present

## 2023-03-06 DIAGNOSIS — J4540 Moderate persistent asthma, uncomplicated: Secondary | ICD-10-CM | POA: Diagnosis not present

## 2023-03-06 MED ORDER — FLUTICASONE-SALMETEROL 250-50 MCG/ACT IN AEPB
1.0000 | INHALATION_SPRAY | Freq: Two times a day (BID) | RESPIRATORY_TRACT | 4 refills | Status: DC
Start: 2023-03-06 — End: 2023-12-04

## 2023-03-06 NOTE — Assessment & Plan Note (Signed)
Had EGD.  Being managed by GI.

## 2023-03-06 NOTE — Telephone Encounter (Signed)
Submitted a Prior Authorization request to [facility] for DUPIXENT via CoverMyMeds. Will update once we receive a response.  Key: [id number]  Chesley Mires, PharmD, MPH, BCPS, CPP Clinical Pharmacist (Rheumatology and Pulmonology)

## 2023-03-06 NOTE — Patient Instructions (Signed)
Wixela/ Advair refill sent to [facility]  I'm glad Dupixent has been helpful- we can continue.  Please let us know if we can help

## 2023-03-06 NOTE — Assessment & Plan Note (Signed)
Currently controlled but needs to stay out of hot humid weather outdoors.  We reviewed medications.  Dupixent definitely helpful. Plan-continue current meds

## 2023-03-07 NOTE — Telephone Encounter (Signed)
Received notification from [facility] regarding a prior authorization for DUPIXENT. Authorization has been APPROVED from 03/07/23 to 03/06/24. Approval letter sent to scan center.  Patient must continue to fill through [facility]: [phone]  Authorization # [id number]  Chesley Mires, PharmD, MPH, BCPS, CPP Clinical Pharmacist (Rheumatology and Pulmonology)

## 2023-03-08 ENCOUNTER — Ambulatory Visit (HOSPITAL_BASED_OUTPATIENT_CLINIC_OR_DEPARTMENT_OTHER): Payer: Medicare Other | Admitting: Physician Assistant

## 2023-03-09 ENCOUNTER — Ambulatory Visit (HOSPITAL_BASED_OUTPATIENT_CLINIC_OR_DEPARTMENT_OTHER): Payer: Medicare Other | Admitting: Physician Assistant

## 2023-03-09 ENCOUNTER — Encounter (HOSPITAL_BASED_OUTPATIENT_CLINIC_OR_DEPARTMENT_OTHER): Payer: Self-pay | Admitting: Physician Assistant

## 2023-03-09 ENCOUNTER — Ambulatory Visit (HOSPITAL_BASED_OUTPATIENT_CLINIC_OR_DEPARTMENT_OTHER): Payer: Medicare Other

## 2023-03-09 DIAGNOSIS — M25511 Pain in right shoulder: Secondary | ICD-10-CM

## 2023-03-09 DIAGNOSIS — G8929 Other chronic pain: Secondary | ICD-10-CM

## 2023-03-09 NOTE — Progress Notes (Addendum)
Office Visit Note   Patient: Debra Byrd           Date of Birth: 1946-06-09           MRN: [id number] Visit Date: 03/09/2023              Requested by: Leane Call, PA-C [address] [place],  [place] [address] PCP: Leane Call, PA-C   Assessment & Plan: Visit Diagnoses:  1. Chronic right shoulder pain     Plan: Patient is a pleasant 77 year old woman who is a previous patient of Dr. Cleophas Dunker.  She underwent a rotator cuff repair on her right shoulder with distal clavicle excision and subacromial decompression.  She does say that shortly after this surgery she did retear her rotator cuff and it was felt to treat this conservatively because she had issues in her other shoulder.  Couple days ago she was reaching above on a shelf and also doing some repetitive work and began having pain on the top of her shoulder into her scapula.  Denies any neck pain any paresthesias.  She admits its gotten better she has been using ice.  She does want to make sure she did not tear the rotator cuff further.  She does have excellent strength and range of motion.  Really does not have any significant clinical findings today.  Since she is doing so well I recommended observation and to maybe avoid overhead reaching for the next week or so.  If she loses strength or has problems she will follow-up.  Ultimately if she did lose function of this arm I told her that she would be not looking at repairing the rotator cuff but a reverse total shoulder  Follow-Up Instructions: As needed  Orders:  Orders Placed This Encounter  Procedures   DG Shoulder Right  Radiographs of her shoulder were taken today.  She does have some glenohumeral arthritis with some sclerotic changes.  Postsurgical findings consistent with distal clavicle excision and metal anchor in the humeral head from previous surgery.  No acute fractures noted No orders of the defined types were placed in this encounter.      Procedures: No procedures performed   Clinical Data: No additional findings.   Subjective: Chief Complaint  Patient presents with   Right Shoulder - Pain    HPI Pleasant 77 year old woman former patient of Dr. Cleophas Dunker with a 2-day history of right shoulder pain she said this began after reaching up on shells bring taking things down she also did some repetitive work.  While this was quite painful initially she says its only mildly painful today.  Denies any neck pain paresthesias or radicular symptoms Review of Systems  All other systems reviewed and are negative.    Objective: Vital Signs: There were no vitals taken for this visit.  Physical Exam Constitutional:      Appearance: Normal appearance.  Pulmonary:     Effort: Pulmonary effort is normal.  Skin:    General: Skin is warm and dry.  Neurological:     General: No focal deficit present.     Mental Status: She is alert.     Ortho Exam Right shoulder: Well-healed surgical incision no swelling she has full forward elevation internal rotation behind her back.  She has 5 out of 5 strength with resisted abduction external and internal rotation negative empty can test mildly positive speeds test but she does have a history of a proximal biceps tear  no pain with range of motion Specialty Comments:  No specialty comments available.  Imaging: No results found.   PMFS History: Patient Active Problem List   Diagnosis Date Noted   Preoperative clearance 09/13/2022   Atelectasis of left lung 12/04/2020   Atypical chest pain 12/02/2020   Moderate persistent asthmatic bronchitis with exacerbation 10/19/2020   COVID-19 virus infection 10/16/2020   COVID-19 09/2020   Cystocele with prolapse 06/15/2020   Depression 04/18/2020   Acute left flank pain 04/09/2020   Prediabetes 02/28/2020   Pain in right shoulder 11/20/2019   Dysfunction of right eustachian tube 09/07/2019   Vertigo 08/27/2019   Trochanteric bursitis,  left hip 05/28/2019   Low back pain 05/28/2019   Fibromyalgia 05/28/2019   Spondylolisthesis, lumbar region 05/28/2019   SI joint arthritis 05/28/2019   Encounter for Medicare annual wellness exam 01/23/2018   Loose stools 01/09/2018   Non-intractable vomiting with nausea 01/09/2018   Drug allergy, antibiotic 08/24/2017   Lower respiratory infection (e.g., bronchitis, pneumonia, pneumonitis, pulmonitis) 08/03/2017   GERD (gastroesophageal reflux disease) 03/20/2017   Impaired fasting glucose 05/26/2016   Allergic rhinitis with postnasal drip 10/11/2015   APC (atrial premature contractions) 07/06/2015   Essential hypertension 07/06/2015   Hyperlipidemia 07/06/2015   Non-rheumatic mitral regurgitation 07/06/2015   Palpitation 07/06/2015   Stenosis of right carotid artery 07/06/2015   Asthma, moderate persistent 02/12/2010   CAD 08/13/2008   PERIPHERAL VASCULAR DISEASE 08/13/2008   Coronary atherosclerosis 08/13/2008   ACUTE MUCOID OTITIS MEDIA 08/08/2007   NEUROPATHY 06/04/2007   GLAUCOMA 06/04/2007   MITRAL VALVE PROLAPSE 06/04/2007   Seasonal and perennial allergic rhinitis 06/04/2007   Chronic hoarseness 06/04/2007   FIBROMYALGIA 06/04/2007   Mitral valve disease 06/04/2007   Mononeuritis 06/04/2007   Past Medical History:  Diagnosis Date   Allergic rhinitis, cause unspecified    Asthma    Chronic airway obstruction, not elsewhere classified    Coronary atherosclerosis of unspecified type of vessel, native or graft    COVID-19 09/2020   Mitral valve disorders(424.0)    Mononeuritis of unspecified site    Myalgia and myositis, unspecified    Peripheral vascular disease, unspecified (HCC)    Sleep disturbance, unspecified    Unspecified glaucoma(365.9)     Family History  Problem Relation Age of Onset   Heart disease Father    Emphysema Maternal Uncle     Past Surgical History:  Procedure Laterality Date   ROTATOR CUFF REPAIR     Social History   Occupational  History   Not on file  Tobacco Use   Smoking status: Never    Passive exposure: Never   Smokeless tobacco: Never  Vaping Use   Vaping status: Never Used  Substance and Sexual Activity   Alcohol use: No   Drug use: No   Sexual activity: Not on file

## 2023-03-16 ENCOUNTER — Ambulatory Visit (INDEPENDENT_AMBULATORY_CARE_PROVIDER_SITE_OTHER): Payer: Medicare Other

## 2023-03-16 VITALS — BP 108/72 | HR 55 | Temp 98.5°F | Resp 16 | Ht 61.0 in | Wt 156.0 lb

## 2023-03-16 DIAGNOSIS — J4540 Moderate persistent asthma, uncomplicated: Secondary | ICD-10-CM

## 2023-03-16 MED ORDER — DUPILUMAB 300 MG/2ML ~~LOC~~ SOSY
300.0000 mg | PREFILLED_SYRINGE | Freq: Once | SUBCUTANEOUS | Status: AC
  Administered 2023-03-16: 300 mg via SUBCUTANEOUS
  Filled 2023-03-16: qty 2

## 2023-03-16 NOTE — Progress Notes (Signed)
Diagnosis: Asthma  Provider:  Chilton Greathouse MD  Procedure: Injection  Dupixent (Dupilumab), Dose: 300 mg, Site: subcutaneous, Number of injections: 1  Post Care:  No observation period. Left anterior thigh  Discharge: Condition: Good, Destination: Home . AVS Declined  Performed by:  Rico Ala, LPN

## 2023-03-21 ENCOUNTER — Telehealth: Payer: Self-pay | Admitting: Orthopaedic Surgery

## 2023-03-21 NOTE — Telephone Encounter (Signed)
Received call from patient requesting copy of her records from Dr. Cleophas Dunker on her shoulder to go to another doctor. She is going to come in and complete authorization. I advised patient I will send records as soon as I get her auth.

## 2023-03-27 ENCOUNTER — Ambulatory Visit (INDEPENDENT_AMBULATORY_CARE_PROVIDER_SITE_OTHER): Payer: Medicare Other | Admitting: Podiatry

## 2023-03-27 DIAGNOSIS — L84 Corns and callosities: Secondary | ICD-10-CM

## 2023-03-27 DIAGNOSIS — B351 Tinea unguium: Secondary | ICD-10-CM

## 2023-03-27 DIAGNOSIS — D689 Coagulation defect, unspecified: Secondary | ICD-10-CM

## 2023-03-27 DIAGNOSIS — M79674 Pain in right toe(s): Secondary | ICD-10-CM | POA: Diagnosis not present

## 2023-03-27 DIAGNOSIS — M79675 Pain in left toe(s): Secondary | ICD-10-CM | POA: Diagnosis not present

## 2023-03-27 NOTE — Progress Notes (Signed)
  Subjective:  Patient ID: Debra Byrd, female    DOB: 28-Sep-1945,  MRN: [id number]  Chief Complaint  Patient presents with   Nail Problem    RFC    Callouses    CALLUS BILAT BLOOD THINNER    77 y.o. female presents with the above complaint. History confirmed with patient. Patient presenting with pain related to dystrophic thickened elongated nails. Patient is unable to trim own nails related to nail dystrophy and/or mobility issues. Patient does not have a history of T2DM.  Patient does have calluses present bilateral foot 2 on the left and 1 on the right that cause pain with pressure  Objective:  Physical Exam: warm, good capillary refill nail exam onychomycosis of the toenails, onycholysis, and dystrophic nails DP pulses palpable, PT pulses palpable, and protective sensation intact Left Foot:  Pain with palpation of nails due to elongation and dystrophic growth.  Hyperkeratotic lesion subsecond metatarsal head as well as medial aspect of the first metatarsophalangeal joint Right Foot: Pain with palpation of nails due to elongation and dystrophic growth.  Hyperkeratotic lesion at the medial aspect of the first MPJ  Assessment:   1. Pain due to onychomycosis of toenails of both feet   2. Callus   3. Coagulation defect [facility])       Plan:  Patient was evaluated and treated and all questions answered.  #Hyperkeratotic lesions/pre ulcerative calluses present bilateral 1st MPJ medially and sub 2nd Met head left foot All symptomatic hyperkeratoses x 3 separate lesions were safely debrided with a sterile #10 blade to patient's level of comfort without incident. We discussed preventative and palliative care of these lesions including supportive and accommodative shoegear, padding, prefabricated and custom molded accommodative orthoses, use of a pumice stone and lotions/creams daily.  #Onychomycosis with pain  -Nails palliatively debrided as below. -Educated on self-care  Procedure:  Nail Debridement Rationale: Pain Type of Debridement: manual, sharp debridement. Instrumentation: Nail nipper, rotary burr. Number of Nails: 10  Return in about 3 months (around 06/27/2023).         Corinna Gab, DPM [facility] / [facility]

## 2023-03-27 NOTE — Patient Instructions (Signed)
Achilles Tendinitis  with Rehab Achilles tendinitis is a disorder of the Achilles tendon. The Achilles tendon connects the large calf muscles (Gastrocnemius and Soleus) to the heel bone (calcaneus). This tendon is sometimes called the heel cord. It is important for pushing-off and standing on your toes and is important for walking, running, or jumping. Tendinitis is often caused by overuse and repetitive microtrauma. SYMPTOMS  Pain, tenderness, swelling, warmth, and redness may occur over the Achilles tendon even at rest.  Pain with pushing off, or flexing or extending the ankle.  Pain that is worsened after or during activity. CAUSES   Overuse sometimes seen with rapid increase in exercise programs or in sports requiring running and jumping.  Poor physical conditioning (strength and flexibility or endurance).  Running sports, especially training running down hills.  Inadequate warm-up before practice or play or failure to stretch before participation.  Injury to the tendon. PREVENTION   Warm up and stretch before practice or competition.  Allow time for adequate rest and recovery between practices and competition.  Keep up conditioning.  Keep up ankle and leg flexibility.  Improve or keep muscle strength and endurance.  Improve cardiovascular fitness.  Use proper technique.  Use proper equipment (shoes, skates).  To help prevent recurrence, taping, protective strapping, or an adhesive bandage may be recommended for several weeks after healing is complete. PROGNOSIS   Recovery may take weeks to several months to heal.  Longer recovery is expected if symptoms have been prolonged.  Recovery is usually quicker if the inflammation is due to a direct blow as compared with overuse or sudden strain. RELATED COMPLICATIONS   Healing time will be prolonged if the condition is not correctly treated. The injury must be given plenty of time to heal.  Symptoms can reoccur if  activity is resumed too soon.  Untreated, tendinitis may increase the risk of tendon rupture requiring additional time for recovery and possibly surgery. TREATMENT   The first treatment consists of rest anti-inflammatory medication, and ice to relieve the pain.  Stretching and strengthening exercises after resolution of pain will likely help reduce the risk of recurrence. Referral to a physical therapist or athletic trainer for further evaluation and treatment may be helpful.  A walking boot or cast may be recommended to rest the Achilles tendon. This can help break the cycle of inflammation and microtrauma.  Arch supports (orthotics) may be prescribed or recommended by your caregiver as an adjunct to therapy and rest.  Surgery to remove the inflamed tendon lining or degenerated tendon tissue is rarely necessary and has shown less than predictable results. MEDICATION   Nonsteroidal anti-inflammatory medications, such as aspirin and ibuprofen, may be used for pain and inflammation relief. Do not take within 7 days before surgery. Take these as directed by your caregiver. Contact your caregiver immediately if any bleeding, stomach upset, or signs of allergic reaction occur. Other minor pain relievers, such as acetaminophen, may also be used.  Pain relievers may be prescribed as necessary by your caregiver. Do not take prescription pain medication for longer than 4 to 7 days. Use only as directed and only as much as you need. HEAT AND COLD  Cold is used to relieve pain and reduce inflammation for acute and chronic Achilles tendinitis. Cold should be applied for 10 to 15 minutes every 2 to 3 hours for inflammation and pain and immediately after any activity that aggravates your symptoms. Use ice packs or an ice massage.  Heat may be used   before performing stretching and strengthening activities prescribed by your caregiver. Use a heat pack or a warm soak. SEEK MEDICAL CARE IF:  Symptoms get  worse or do not improve in 2 weeks despite treatment.  New, unexplained symptoms develop. Drugs used in treatment may produce side effects.   EXERCISES-- hold each stretch for 30 seconds and repeat 10 times.  Complete each stretch 3 times per day.   RANGE OF MOTION (ROM) AND STRETCHING EXERCISES - Achilles Tendinitis  These exercises may help you when beginning to rehabilitate your injury. Your symptoms may resolve with or without further involvement from your physician, physical therapist or athletic trainer. While completing these exercises, remember:   Restoring tissue flexibility helps normal motion to return to the joints. This allows healthier, less painful movement and activity.  An effective stretch should be held for at least 30 seconds.  A stretch should never be painful. You should only feel a gentle lengthening or release in the stretched tissue. STRETCH  Gastroc, Standing   Place hands on wall.  Extend right / left leg, keeping the front knee somewhat bent.  Slightly point your toes inward on your back foot.  Keeping your right / left heel on the floor and your knee straight, shift your weight toward the wall, not allowing your back to arch.  You should feel a gentle stretch in the right / left calf. Hold this position for __________ seconds. Repeat __________ times. Complete this stretch __________ times per day. STRETCH  Soleus, Standing   Place hands on wall.  Extend right / left leg, keeping the other knee somewhat bent.  Slightly point your toes inward on your back foot.  Keep your right / left heel on the floor, bend your back knee, and slightly shift your weight over the back leg so that you feel a gentle stretch deep in your back calf.  Hold this position for __________ seconds. Repeat __________ times. Complete this stretch __________ times per day. STRETCH  Gastrocsoleus, Standing  Note: This exercise can place a lot of stress on your foot and ankle.  Please complete this exercise only if specifically instructed by your caregiver.   Place the ball of your right / left foot on a step, keeping your other foot firmly on the same step.  Hold on to the wall or a rail for balance.  Slowly lift your other foot, allowing your body weight to press your heel down over the edge of the step.  You should feel a stretch in your right / left calf.  Hold this position for __________ seconds.  Repeat this exercise with a slight bend in your knee. Repeat __________ times. Complete this stretch __________ times per day.  STRENGTHENING EXERCISES - Achilles Tendinitis These exercises may help you when beginning to rehabilitate your injury. They may resolve your symptoms with or without further involvement from your physician, physical therapist or athletic trainer. While completing these exercises, remember:   Muscles can gain both the endurance and the strength needed for everyday activities through controlled exercises.  Complete these exercises as instructed by your physician, physical therapist or athletic trainer. Progress the resistance and repetitions only as guided.  You may experience muscle soreness or fatigue, but the pain or discomfort you are trying to eliminate should never worsen during these exercises. If this pain does worsen, stop and make certain you are following the directions exactly. If the pain is still present after adjustments, discontinue the exercise until you can discuss   the trouble with your clinician. STRENGTH - Plantar-flexors   Sit with your right / left leg extended. Holding onto both ends of a rubber exercise band/tubing, loop it around the ball of your foot. Keep a slight tension in the band.  Slowly push your toes away from you, pointing them downward.  Hold this position for __________ seconds. Return slowly, controlling the tension in the band/tubing. Repeat __________ times. Complete this exercise __________ times  per day.  STRENGTH - Plantar-flexors   Stand with your feet shoulder width apart. Steady yourself with a wall or table using as little support as needed.  Keeping your weight evenly spread over the width of your feet, rise up on your toes.*  Hold this position for __________ seconds. Repeat __________ times. Complete this exercise __________ times per day.  *If this is too easy, shift your weight toward your right / left leg until you feel challenged. Ultimately, you may be asked to do this exercise with your right / left foot only. STRENGTH  Plantar-flexors, Eccentric  Note: This exercise can place a lot of stress on your foot and ankle. Please complete this exercise only if specifically instructed by your caregiver.   Place the balls of your feet on a step. With your hands, use only enough support from a wall or rail to keep your balance.  Keep your knees straight and rise up on your toes.  Slowly shift your weight entirely to your right / left toes and pick up your opposite foot. Gently and with controlled movement, lower your weight through your right / left foot so that your heel drops below the level of the step. You will feel a slight stretch in the back of your calf at the end position.  Use the healthy leg to help rise up onto the balls of both feet, then lower weight only on the right / left leg again. Build up to 15 repetitions. Then progress to 3 consecutive sets of 15 repetitions.*  After completing the above exercise, complete the same exercise with a slight knee bend (about 30 degrees). Again, build up to 15 repetitions. Then progress to 3 consecutive sets of 15 repetitions.* Perform this exercise __________ times per day.  *When you easily complete 3 sets of 15, your physician, physical therapist or athletic trainer may advise you to add resistance by wearing a backpack filled with additional weight. STRENGTH - Plantar Flexors, Seated   Sit on a chair that allows your feet  to rest flat on the ground. If necessary, sit at the edge of the chair.  Keeping your toes firmly on the ground, lift your right / left heel as far as you can without increasing any discomfort in your ankle. Repeat __________ times. Complete this exercise __________ times a day. *If instructed by your physician, physical therapist or athletic trainer, you may add ____________________ of resistance by placing a weighted object on your right / left knee. Document Released: 03/09/2005 Document Revised: 10/31/2011 Document Reviewed: 11/20/2008 ExitCare Patient Information 2014 [employer].    

## 2023-03-29 ENCOUNTER — Other Ambulatory Visit: Payer: Self-pay | Admitting: Cardiology

## 2023-03-30 ENCOUNTER — Ambulatory Visit: Payer: Medicare Other | Admitting: *Deleted

## 2023-03-30 MED ORDER — DUPILUMAB 300 MG/2ML ~~LOC~~ SOSY
300.0000 mg | PREFILLED_SYRINGE | Freq: Once | SUBCUTANEOUS | Status: DC
  Filled 2023-03-30: qty 2

## 2023-03-31 ENCOUNTER — Ambulatory Visit (INDEPENDENT_AMBULATORY_CARE_PROVIDER_SITE_OTHER): Payer: Medicare Other

## 2023-03-31 VITALS — BP 106/69 | HR 60 | Temp 98.4°F | Resp 17 | Ht 61.0 in | Wt 157.2 lb

## 2023-03-31 DIAGNOSIS — J4540 Moderate persistent asthma, uncomplicated: Secondary | ICD-10-CM

## 2023-03-31 MED ORDER — DUPILUMAB 300 MG/2ML ~~LOC~~ SOSY
300.0000 mg | PREFILLED_SYRINGE | Freq: Once | SUBCUTANEOUS | Status: AC
  Administered 2023-03-31: 300 mg via SUBCUTANEOUS
  Filled 2023-03-31: qty 2

## 2023-03-31 NOTE — Progress Notes (Signed)
Diagnosis: Asthma  Provider:  Chilton Greathouse MD  Procedure: Injection  Dupixent (Dupilumab), Dose: 300 mg, Site: subcutaneous, Number of injections: 1  Administered in anterior right thigh.  Post Care: Patient declined observation  Discharge: Condition: Good, Destination: Home . AVS Declined  Performed by:  Wyvonne Lenz, RN

## 2023-04-13 ENCOUNTER — Ambulatory Visit: Payer: Medicare Other

## 2023-04-14 ENCOUNTER — Ambulatory Visit: Payer: Medicare Other | Admitting: Internal Medicine

## 2023-04-16 ENCOUNTER — Other Ambulatory Visit: Payer: Self-pay | Admitting: Cardiology

## 2023-04-17 ENCOUNTER — Ambulatory Visit (INDEPENDENT_AMBULATORY_CARE_PROVIDER_SITE_OTHER): Payer: Medicare Other

## 2023-04-17 VITALS — BP 97/58 | HR 61 | Temp 97.7°F | Resp 16 | Ht 61.0 in | Wt 156.4 lb

## 2023-04-17 DIAGNOSIS — J4540 Moderate persistent asthma, uncomplicated: Secondary | ICD-10-CM

## 2023-04-17 MED ORDER — DUPILUMAB 300 MG/2ML ~~LOC~~ SOSY
300.0000 mg | PREFILLED_SYRINGE | Freq: Once | SUBCUTANEOUS | Status: AC
  Administered 2023-04-17: 300 mg via SUBCUTANEOUS
  Filled 2023-04-17: qty 2

## 2023-04-17 NOTE — Progress Notes (Signed)
Diagnosis: Asthma  Provider:  Chilton Greathouse MD  Procedure: Injection  Dupixent (Dupilumab), Dose: 300 mg, Site: subcutaneous, Number of injections: 1   Discharge: Condition: Good, Destination: Home . AVS Declined  Performed by:  Nat Math, RN

## 2023-05-02 ENCOUNTER — Other Ambulatory Visit: Payer: Self-pay

## 2023-05-02 ENCOUNTER — Ambulatory Visit (INDEPENDENT_AMBULATORY_CARE_PROVIDER_SITE_OTHER): Payer: Medicare Other | Admitting: *Deleted

## 2023-05-02 VITALS — BP 127/76 | HR 58 | Temp 97.7°F | Resp 16 | Ht 61.0 in | Wt 154.0 lb

## 2023-05-02 DIAGNOSIS — J4540 Moderate persistent asthma, uncomplicated: Secondary | ICD-10-CM

## 2023-05-02 MED ORDER — DUPILUMAB 300 MG/2ML ~~LOC~~ SOSY
300.0000 mg | PREFILLED_SYRINGE | Freq: Once | SUBCUTANEOUS | Status: AC
  Administered 2023-05-02: 300 mg via SUBCUTANEOUS
  Filled 2023-05-02: qty 2

## 2023-05-02 NOTE — Progress Notes (Signed)
Diagnosis: Asthma  Provider:  Mannam, Praveen MD  Procedure: Injection  Dupixent (Dupilumab), Dose: 300 mg, Site: subcutaneous, Number of injections: 1  Post Care: Observation period completed  Discharge: Condition: Good, Destination: Home . AVS Declined  Performed by:  Williams, Kathryn A, RN       

## 2023-05-03 ENCOUNTER — Encounter: Payer: Self-pay | Admitting: Internal Medicine

## 2023-05-03 ENCOUNTER — Other Ambulatory Visit: Payer: Self-pay | Admitting: Cardiology

## 2023-05-16 ENCOUNTER — Ambulatory Visit: Payer: Medicare Other | Admitting: *Deleted

## 2023-05-16 VITALS — BP 115/73 | HR 52 | Temp 97.6°F | Resp 14 | Ht 61.0 in | Wt 154.0 lb

## 2023-05-16 DIAGNOSIS — J4540 Moderate persistent asthma, uncomplicated: Secondary | ICD-10-CM | POA: Diagnosis not present

## 2023-05-16 MED ORDER — DUPILUMAB 300 MG/2ML ~~LOC~~ SOSY
300.0000 mg | PREFILLED_SYRINGE | Freq: Once | SUBCUTANEOUS | Status: AC
  Administered 2023-05-16: 300 mg via SUBCUTANEOUS
  Filled 2023-05-16: qty 2

## 2023-05-16 NOTE — Progress Notes (Signed)
Diagnosis: Asthma  Provider:  Chilton Greathouse MD  Procedure: Injection  Dupixent (Dupilumab), Dose: 300 mg, Site: subcutaneous, Number of injections: 1  Post Care: Observation period completed  Discharge: Condition: Good, Destination: Home . AVS Declined  Performed by:  Forrest Moron, RN

## 2023-05-17 ENCOUNTER — Ambulatory Visit (INDEPENDENT_AMBULATORY_CARE_PROVIDER_SITE_OTHER): Payer: Medicare Other

## 2023-05-17 ENCOUNTER — Ambulatory Visit (INDEPENDENT_AMBULATORY_CARE_PROVIDER_SITE_OTHER): Payer: Medicare Other | Admitting: Podiatry

## 2023-05-17 DIAGNOSIS — S99922A Unspecified injury of left foot, initial encounter: Secondary | ICD-10-CM

## 2023-05-17 NOTE — Progress Notes (Signed)
Chief Complaint  Patient presents with   Foot Injury    Patient had foot injury and went to [facility] where they obtained xrays and was negative for fractures. Patient was using surgical shoe. Pain is minimum at this time. Has osteoporosis.    HPI: 77 y.o. female presents today with history of a left foot injury.  This occurred on 05/03/2023 when walking in her driveway.  She does not recall a specific mechanism of injury but does note pain to the top of the foot.  Patient has been wearing a surgical shoe and taking Tylenol since she is on a blood thinner.  She was seen in urgent care 2 days after the pain began.  States that they did obtain x-rays which were negative for fracture.  She does have osteoporosis.  Prolia is her current medication for this.  Past Medical History:  Diagnosis Date   Allergic rhinitis, cause unspecified    Asthma    Chronic airway obstruction, not elsewhere classified    Coronary atherosclerosis of unspecified type of vessel, native or graft    COVID-19 09/2020   Mitral valve disorders(424.0)    Mononeuritis of unspecified site    Myalgia and myositis, unspecified    Peripheral vascular disease, unspecified (HCC)    Sleep disturbance, unspecified    Unspecified glaucoma(365.9)     Past Surgical History:  Procedure Laterality Date   ROTATOR CUFF REPAIR      Allergies  Allergen Reactions   Iodinated Contrast Media Shortness Of Breath and Swelling    Swelling in facial/No issues with shellfish    Azithromycin Hives    No problem with Biaxin   Iodine Swelling   Levofloxacin     Tendon tears   Misc. Sulfonamide Containing Compounds    Montelukast Other (See Comments)   Montelukast Sodium Swelling   No Known Allergies Other (See Comments)   Cefaclor Rash   Penicillins Swelling   Sulfa Antibiotics Rash   Sulfonamide Derivatives Rash    Physical Exam: General: The patient is alert and oriented x3 in no acute  distress.  Dermatology: Skin is warm, dry and supple bilateral lower extremities. Interspaces are clear of maceration and debris.    Vascular: Palpable pedal pulses bilaterally. Capillary refill within normal limits.  No appreciable edema.  No erythema or calor.  Neurological: Light touch sensation grossly intact bilateral feet.   Musculoskeletal Exam: Some tenderness on palpation of the peroneal tendons on the left.  Resisted plantarflexion of the first metatarsal also reproduce some symptoms near the first metatarsal-medial cuneiform joint which could be from the peroneus longus tendon.    Radiographic Exam left foot, 3 weightbearing views, 05/17/2023):  Decreased osseous mineralization.  Mild increase in first IM and hallux abductus angles.  Minimal degenerative joint changes to the dorsal aspect of the midtarsal joints.  No fracture seen.  Assessment/Plan of Care: 1. Foot injury, left, initial encounter     Discussed clinical findings with patient today.  Informed the patient there is no fracture or stress reaction noted to the bones in the area of discomfort.  Will dispense a compressive ankle sleeve to wear during waking hours over the next 2 weeks.  Continue with Tylenol as needed.  She does not need to continue wearing the surgical shoe and can slowly progress to sneakers.  Call in 1 to 2 weeks if she is still having mild discomfort.  Will refer to physical therapy at that time.  Ran Tullis D. Danaka Llera,  DPM, FACFAS [facility]     [address] [place], [place] [address]                Office [phone]  Fax [phone]

## 2023-05-19 ENCOUNTER — Ambulatory Visit: Payer: Medicare Other | Admitting: Podiatry

## 2023-05-30 ENCOUNTER — Ambulatory Visit: Payer: Medicare Other | Admitting: *Deleted

## 2023-05-30 VITALS — BP 133/71 | HR 55 | Temp 98.2°F | Resp 16 | Ht 61.0 in | Wt 153.8 lb

## 2023-05-30 DIAGNOSIS — J4540 Moderate persistent asthma, uncomplicated: Secondary | ICD-10-CM

## 2023-05-30 MED ORDER — DUPILUMAB 300 MG/2ML ~~LOC~~ SOSY
300.0000 mg | PREFILLED_SYRINGE | Freq: Once | SUBCUTANEOUS | Status: AC
  Administered 2023-05-30: 300 mg via SUBCUTANEOUS

## 2023-05-30 NOTE — Progress Notes (Signed)
Diagnosis: Asthma  Provider:  Mannam, Praveen MD  Procedure: Injection  Dupixent (Dupilumab), Dose: 300 mg, Site: subcutaneous, Number of injections: 1  Post Care: Observation period completed  Discharge: Condition: Good, Destination: Home . AVS Declined  Performed by:  Williams, Kathryn A, RN       

## 2023-06-05 ENCOUNTER — Encounter: Payer: Self-pay | Admitting: Podiatry

## 2023-06-05 ENCOUNTER — Ambulatory Visit (INDEPENDENT_AMBULATORY_CARE_PROVIDER_SITE_OTHER): Payer: Medicare Other | Admitting: Podiatry

## 2023-06-05 VITALS — BP 161/71 | HR 60

## 2023-06-05 DIAGNOSIS — S90211A Contusion of right great toe with damage to nail, initial encounter: Secondary | ICD-10-CM

## 2023-06-05 NOTE — Progress Notes (Signed)
       Chief Complaint  Patient presents with   Nail Problem    "I've got a toenail that's splitting in the middle of the toenail." N - toenail splitting L - hallux right D - 5 days O - suddenly C - split, pain off and on A - none T - bandaid    HPI: 77 y.o. female presents today with concern of a cracked right hallux toenail.  She does have neuropathy so she is not having any pain to the area.  She is concerned it may rip all the way back.  Denies any drainage  Past Medical History:  Diagnosis Date   Allergic rhinitis, cause unspecified    Asthma    Chronic airway obstruction, not elsewhere classified    Coronary atherosclerosis of unspecified type of vessel, native or graft    COVID-19 09/2020   Mitral valve disorders(424.0)    Mononeuritis of unspecified site    Myalgia and myositis, unspecified    Peripheral vascular disease, unspecified (HCC)    Sleep disturbance, unspecified    Unspecified glaucoma(365.9)     Past Surgical History:  Procedure Laterality Date   ROTATOR CUFF REPAIR      Allergies  Allergen Reactions   Iodinated Contrast Media Shortness Of Breath and Swelling    Swelling in facial/No issues with shellfish    Azithromycin Hives    No problem with Biaxin   Iodine Swelling   Levofloxacin     Tendon tears   Misc. Sulfonamide Containing Compounds    Montelukast Other (See Comments)   Montelukast Sodium Swelling   No Known Allergies Other (See Comments)   Cefaclor Rash   Penicillins Swelling   Sulfa Antibiotics Rash   Sulfonamide Derivatives Rash    Physical Exam: Vitals:   06/05/23 0840  BP: (!) 161/71  Pulse: 60    Palpable pedal pulses right foot.  There is a horizontal crack that does not extend across the entire nail approximately 40% towards the eponychium.  There is no pain on palpation.  There is slight lifting of the distal portion.  No evidence of drainage is noted.  Appears stable.  Assessment/Plan of Care: 1. Contusion of  right great toe with damage to nail, initial encounter     Discussed clinical findings with patient today.  The nail was cut across to shorten the length to lessen the chance that it will catch on her sock and ripped away.  It was filed smooth across the cracks so that is not prominent or sharp.  She was instructed on how to very carefully apply a dot of crazy glue using a Q-tip to the crack of the nail to allow it to be reinforced as it grows out.  If she has any issues with this she can give our office a call.  Otherwise keep her regularly scheduled appointment for routine footcare    Meghen Akopyan DBurna Mortimer, DPM, FACFAS [facility]     [address] [place], [place] [address]                Office [phone]  Fax [phone]

## 2023-06-13 ENCOUNTER — Ambulatory Visit: Payer: Medicare Other

## 2023-06-13 VITALS — BP 151/79 | HR 64 | Temp 97.9°F | Resp 18 | Ht 61.0 in | Wt 153.4 lb

## 2023-06-13 DIAGNOSIS — J4540 Moderate persistent asthma, uncomplicated: Secondary | ICD-10-CM

## 2023-06-13 MED ORDER — DUPILUMAB 300 MG/2ML ~~LOC~~ SOSY
300.0000 mg | PREFILLED_SYRINGE | Freq: Once | SUBCUTANEOUS | Status: AC
  Administered 2023-06-13: 300 mg via SUBCUTANEOUS
  Filled 2023-06-13: qty 2

## 2023-06-13 NOTE — Progress Notes (Signed)
Diagnosis: Asthma  Provider:  Mannam, Praveen MD  Procedure: Injection  Dupixent (Dupilumab), Dose: 300 mg, Site: subcutaneous, Number of injections: 1  Post Care: Observation period completed  Discharge: Condition: Good, Destination: Home . AVS Declined  Performed by:  Williams, Kathryn A, RN       

## 2023-06-27 ENCOUNTER — Ambulatory Visit: Payer: Medicare Other | Admitting: Podiatry

## 2023-06-27 ENCOUNTER — Other Ambulatory Visit: Payer: Self-pay | Admitting: Internal Medicine

## 2023-06-27 ENCOUNTER — Ambulatory Visit: Payer: Medicare Other

## 2023-06-27 DIAGNOSIS — J4540 Moderate persistent asthma, uncomplicated: Secondary | ICD-10-CM

## 2023-06-28 ENCOUNTER — Ambulatory Visit (INDEPENDENT_AMBULATORY_CARE_PROVIDER_SITE_OTHER): Payer: Medicare Other

## 2023-06-28 VITALS — BP 130/62 | HR 48 | Temp 98.1°F | Resp 20 | Ht 61.0 in | Wt 155.8 lb

## 2023-06-28 DIAGNOSIS — J4540 Moderate persistent asthma, uncomplicated: Secondary | ICD-10-CM | POA: Diagnosis not present

## 2023-06-28 MED ORDER — DUPILUMAB 300 MG/2ML ~~LOC~~ SOSY
300.0000 mg | PREFILLED_SYRINGE | Freq: Once | SUBCUTANEOUS | Status: AC
Start: 2023-06-28 — End: 2023-06-28
  Administered 2023-06-28: 300 mg via SUBCUTANEOUS
  Filled 2023-06-28: qty 2

## 2023-06-28 NOTE — Progress Notes (Signed)
Diagnosis: Iron Deficiency Anemia  Provider:  Chilton Greathouse MD  Procedure: Injection  Dupixent (Dupilumab), Dose: 300 mg, Site: subcutaneous, Number of injections: 1  Post Care:     Discharge: Condition: Good, Destination: Home . AVS Declined  Performed by:  Garnette Czech, RN

## 2023-06-30 NOTE — Telephone Encounter (Signed)
Refill sent for DUPIXENT to [facility]: [phone]  Dose: 300mg  SQ every 2 weeks  Last OV: 03/06/2023 Provider: Dr. Maple Hudson  Next OV: 03/05/2024  Routing to scheduling team for follow-up on appt scheduling  Chesley Mires, PharmD, MPH, BCPS Clinical Pharmacist (Rheumatology and Pulmonology)

## 2023-07-04 ENCOUNTER — Ambulatory Visit (INDEPENDENT_AMBULATORY_CARE_PROVIDER_SITE_OTHER): Payer: Medicare Other | Admitting: Podiatry

## 2023-07-04 DIAGNOSIS — D689 Coagulation defect, unspecified: Secondary | ICD-10-CM

## 2023-07-04 DIAGNOSIS — M79675 Pain in left toe(s): Secondary | ICD-10-CM | POA: Diagnosis not present

## 2023-07-04 DIAGNOSIS — M79674 Pain in right toe(s): Secondary | ICD-10-CM | POA: Diagnosis not present

## 2023-07-04 DIAGNOSIS — B351 Tinea unguium: Secondary | ICD-10-CM | POA: Diagnosis not present

## 2023-07-04 DIAGNOSIS — Q828 Other specified congenital malformations of skin: Secondary | ICD-10-CM

## 2023-07-04 NOTE — Progress Notes (Signed)
d  Subjective:  Patient ID: Debra Byrd, female    DOB: 10-Jul-1946,  MRN: [id number]  Chief Complaint  Patient presents with   Foot Care    Not diabetic. Takes Plavix and ASA. Has a toenail split on the right 1st toe. Also has some calloses.     77 y.o. female presents with the above complaint. History confirmed with patient. Patient presenting with pain related to dystrophic thickened elongated nails. Patient is unable to trim own nails related to nail dystrophy and/or mobility issues. Patient does not have a history of T2DM.  Patient does have calluses present bilateral foot 2 on the left and 1 on the right that cause pain with pressure  Objective:  Physical Exam: warm, good capillary refill nail exam onychomycosis of the toenails, onycholysis, and dystrophic nails DP pulses palpable, PT pulses palpable, and protective sensation intact Left Foot:  Pain with palpation of nails due to elongation and dystrophic growth.  Hyperkeratotic lesion subsecond metatarsal head as well as medial aspect of the first metatarsophalangeal joint Right Foot: Pain with palpation of nails due to elongation and dystrophic growth.  Hyperkeratotic lesion at the medial aspect of the first MPJ  Assessment:   1. Pain due to onychomycosis of toenails of both feet   2. Porokeratosis   3. Coagulation defect [facility])        Plan:  Patient was evaluated and treated and all questions answered.  #Hyperkeratotic lesions/pre ulcerative calluses present bilateral 1st MPJ medially and sub 2nd Met head left foot All symptomatic hyperkeratoses x 3 separate lesions were safely debrided with a sterile #10 blade to patient's level of comfort without incident. We discussed preventative and palliative care of these lesions including supportive and accommodative shoegear, padding, prefabricated and custom molded accommodative orthoses, use of a pumice stone and lotions/creams daily.  #Onychomycosis with pain  -Nails  palliatively debrided as below. -Educated on self-care  Procedure: Nail Debridement Rationale: Pain Type of Debridement: manual, sharp debridement. Instrumentation: Nail nipper, rotary burr. Number of Nails: 10  Return in about 3 months (around 10/04/2023) for RFC.         Corinna Gab, DPM [facility] / [facility]

## 2023-07-05 NOTE — Progress Notes (Unsigned)
Cardiology Office Note:    Date:  07/06/2023   ID:  Debra Byrd, DOB 08/18/1946, MRN [id number]  PCP:  Debra Call, PA-C  Cardiologist:  Debra Herrlich, MD    Referring MD: Debra Call, PA-C    ASSESSMENT:    1. APC (atrial premature contractions)   2. Essential hypertension   3. Mixed hyperlipidemia   4. Non-rheumatic mitral regurgitation   5. Stenosis of right carotid artery    PLAN:    In order of problems listed above:  Uncertain episode prior to the emergency room I suspect is arrhythmic and she may have had an atrial arrhythmia Should purchase the mobile Kardia or a Fitbit watch that she can self monitor in the future she has good healthcare knowledge Will apply a ZIO monitor hopefully for up to 2 weeks to screen for recurrent arrhythmia Her hypertension is very well-managed she is compliant with medications she will continue home self monitor and continue her ARB for hypertension Stable on echocardiogram Follow-up duplex 1 year Lipids are ideal continue her high intensity statin and clopidogrel with carotid stenosis [facility] records requested   Next appointment: 3 months   Medication Adjustments/Labs and Tests Ordered: Current medicines are reviewed at length with the patient today.  Concerns regarding medicines are outlined above.  No orders of the defined types were placed in this encounter.  No orders of the defined types were placed in this encounter.    History of Present Illness:    Debra Byrd is a 77 y.o. female with a hx of symptomatic APCs hypertension hyperlipidemia right internal carotid artery stenosis mild mitral regurgitation last seen 12/23/2022.She was admitted to [facility] discharge 12/06/2022 with complaint of chest pain CBC was normal hemoglobin 12.9 platelets 350,000 creatinine 0.7 GFR greater than 60 cc BNP level was very low at 47 serial troponins were undetectable.  CT of the chest showed  showed mild coronary  atherosclerosis ascending aorta was measured at 42 mm.  She had myocardial perfusion study which showed fixed defect EF 72% and no ischemia she was seen by cardiology during her hospitalization.  Recent labs 03/29/2023 Hemoglobin 12.5 platelets 264,000 potassium 4.8 sodium 140 creatinine 0.8 GFR 77 cc/min hemoglobin A1c 5.8 liver function test normal Lipid profile cholesterol 136 non-HDL cholesterol 81 LDL 65 triglycerides 78 HDL 55 all at target Compliance with diet, lifestyle and medications: Yes  Unfortunately did not know she was in the ED at [facility] Monday night records requested. She woke up and felt lightheaded pounding in her head they tried to check her blood pressure at home and it would not register and they went to the emergency room initially she has hypertensive greater than 180 systolic and subsequently moderated in the ED no arrhythmia was noted There was a comment that she took too much of her antihypertensive she is never made a mistake with her medications in the past She just cannot answer if she thought she was back in her previous tachycardia  Otherwise she has done well not having flares of asthma edema shortness of breath chest pain palpitation or syncope To follow her carotid disease we will plan another MRA in 1 more year Past Medical History:  Diagnosis Date   Allergic rhinitis, cause unspecified    Asthma    Chronic airway obstruction, not elsewhere classified    Coronary atherosclerosis of unspecified type of vessel, native or graft    COVID-19 09/2020   Mitral valve disorders(424.0)    Mononeuritis  of unspecified site    Myalgia and myositis, unspecified    Peripheral vascular disease, unspecified (HCC)    Sleep disturbance, unspecified    Unspecified glaucoma(365.9)     Current Medications: Current Meds  Medication Sig   albuterol (PROAIR HFA) 108 (90 Base) MCG/ACT inhaler Inhale 2 puffs into the lungs every 6 (six) hours as needed for wheezing.    albuterol (PROVENTIL) (2.5 MG/3ML) 0.083% nebulizer solution Take 3 mLs (2.5 mg total) by nebulization 4 (four) times daily as needed for wheezing.   alclomethasone (ACLOVATE) 0.05 % ointment Apply 1 application topically as needed (rash).   Ascorbic Acid (VITAMIN C) 1000 MG tablet Take 1,000 mg by mouth daily.   aspirin EC 81 MG tablet Take 81 mg by mouth daily. Swallow whole.   calcium gluconate 500 MG tablet Take 500 mg by mouth 2 (two) times daily.     cholecalciferol (VITAMIN D) 1000 UNITS tablet Take 3,000 Units by mouth daily.   clindamycin (CLEOCIN) 150 MG capsule Take 150 mg by mouth 4 (four) times daily. Prior to Dental Procedures   clopidogrel (PLAVIX) 75 MG tablet TAKE 1 TABLET BY MOUTH EVERY DAY   Coenzyme Q10 (COQ10) 100 MG CAPS Take 1 capsule by mouth daily.   diclofenac sodium (VOLTAREN) 1 % GEL Apply 4 g topically 4 (four) times daily.   DUPIXENT 300 MG/2ML prefilled syringe INJECT 1 SYRINGE UNDER THE SKIN EVERY 14 DAYS   DUREZOL 0.05 % EMUL Apply 1 mL topically as needed (dry skin).   EPINEPHrine 0.3 mg/0.3 mL IJ SOAJ injection Inject 0.3 mg into the muscle as needed for anaphylaxis.   famotidine (PEPCID) 40 MG tablet Take 40 mg by mouth 2 (two) times daily.   finasteride (PROSCAR) 5 MG tablet Take 2.5 mg by mouth daily.   fish oil-omega-3 fatty acids 1000 MG capsule Take 1 g by mouth daily. 4 by mouth daily   fluticasone (FLONASE) 50 MCG/ACT nasal spray Place 2 sprays into both nostrils daily.   fluticasone-salmeterol (ADVAIR) 250-50 MCG/ACT AEPB Inhale 1 puff into the lungs in the morning and at bedtime.   gabapentin (NEURONTIN) 600 MG tablet Take 600 mg by mouth at bedtime.   loratadine (CLARITIN) 10 MG tablet Take 1 tablet (10 mg total) by mouth daily.   meclizine (ANTIVERT) 12.5 MG tablet Take 12.5 mg by mouth 3 (three) times daily as needed for dizziness.   methocarbamol (ROBAXIN) 500 MG tablet Take 500 mg by mouth daily.   Multiple Vitamins-Minerals (ZINC PO) Take 1  tablet by mouth daily. Unknown strength   nitroGLYCERIN (NITROSTAT) 0.4 MG SL tablet Place 0.4 mg under the tongue every 5 (five) minutes as needed for chest pain.   omeprazole (PRILOSEC) 40 MG capsule Take 40 mg by mouth in the morning and at bedtime.   potassium chloride (KLOR-CON) 10 MEQ tablet TAKE 1 TABLET BY MOUTH EVERY DAY   pyridOXINE (VITAMIN B-6) 25 MG tablet Take 25 mg by mouth daily.   RESTASIS 0.05 % ophthalmic emulsion Place 1 drop into both eyes 2 (two) times daily.   rosuvastatin (CRESTOR) 5 MG tablet TAKE 1 TABLET (5 MG TOTAL) BY MOUTH DAILY.   Tafluprost, PF, 0.0015 % SOLN Apply to eye.   telmisartan (MICARDIS) 20 MG tablet Take 1 tablet (20 mg total) by mouth at bedtime.   vitamin B-12 (CYANOCOBALAMIN) 1000 MCG tablet Take 1,000 mcg by mouth daily.      EKGs/Labs/Other Studies Reviewed:    The following studies were reviewed today:  Cardiac Studies & Procedures     STRESS TESTS  MYOCARDIAL PERFUSION IMAGING 12/06/2022                  Recent Labs: 08/30/2022: Hemoglobin 13.0; Platelets 306.0  Recent Lipid Panel    Component Value Date/Time   CHOL 145 08/08/2018 0940   TRIG 85 08/08/2018 0940   HDL 67 08/08/2018 0940   CHOLHDL 2.2 08/08/2018 0940   LDLCALC 61 08/08/2018 0940    Physical Exam:    VS:  BP 118/73   Pulse 66   Ht 5\' 1"  (1.549 m)   Wt 154 lb 9.6 oz (70.1 kg)   SpO2 100%   BMI 29.21 kg/m     Wt Readings from Last 3 Encounters:  07/06/23 154 lb 9.6 oz (70.1 kg)  06/28/23 155 lb 12.8 oz (70.7 kg)  06/13/23 153 lb 6.4 oz (69.6 kg)     GEN:  Well nourished, well developed in no acute distress HEENT: Normal NECK: No JVD; No carotid bruits LYMPHATICS: No lymphadenopathy CARDIAC: RRR, no murmurs, rubs, gallops RESPIRATORY:  Clear to auscultation without rales, wheezing or rhonchi  ABDOMEN: Soft, non-tender, non-distended MUSCULOSKELETAL:  No edema; No deformity  SKIN: Warm and dry NEUROLOGIC:  Alert and oriented x 3 PSYCHIATRIC:   Normal affect    Signed, Debra Herrlich, MD  07/06/2023 10:30 AM    [facility]

## 2023-07-06 ENCOUNTER — Encounter: Payer: Self-pay | Admitting: Internal Medicine

## 2023-07-06 ENCOUNTER — Ambulatory Visit: Payer: Medicare Other | Attending: Cardiology | Admitting: Cardiology

## 2023-07-06 ENCOUNTER — Ambulatory Visit: Payer: Medicare Other | Attending: Cardiology

## 2023-07-06 VITALS — BP 118/73 | HR 66 | Ht 61.0 in | Wt 154.6 lb

## 2023-07-06 DIAGNOSIS — I6521 Occlusion and stenosis of right carotid artery: Secondary | ICD-10-CM | POA: Insufficient documentation

## 2023-07-06 DIAGNOSIS — I491 Atrial premature depolarization: Secondary | ICD-10-CM

## 2023-07-06 DIAGNOSIS — I340 Nonrheumatic mitral (valve) insufficiency: Secondary | ICD-10-CM | POA: Diagnosis present

## 2023-07-06 DIAGNOSIS — I10 Essential (primary) hypertension: Secondary | ICD-10-CM | POA: Insufficient documentation

## 2023-07-06 DIAGNOSIS — E782 Mixed hyperlipidemia: Secondary | ICD-10-CM | POA: Diagnosis present

## 2023-07-06 NOTE — Addendum Note (Signed)
Addended by: Roxanne Mins I on: 07/06/2023 10:57 AM   Modules accepted: Orders

## 2023-07-06 NOTE — Addendum Note (Signed)
Addended by: Roxanne Mins I on: 07/06/2023 11:47 AM   Modules accepted: Orders

## 2023-07-06 NOTE — Patient Instructions (Addendum)
Medication Instructions:  Your physician recommends that you continue on your current medications as directed. Please refer to the Current Medication list given to you today.  *If you need a refill on your cardiac medications before your next appointment, please call your pharmacy*   Lab Work: None If you have labs (blood work) drawn today and your tests are completely normal, you will receive your results only by: MyChart Message (if you have MyChart) OR A paper copy in the mail If you have any lab test that is abnormal or we need to change your treatment, we will call you to review the results.   Testing/Procedures: A zio monitor was ordered today. It will remain on for 14 days. You will then return monitor and event diary in provided box. It takes 1-2 weeks for report to be downloaded and returned to [place]. We will call you with the results. If monitor falls off or has orange flashing light, please call Zio for further instructions.     Follow-Up: At [facility], you and your health needs are our priority.  As part of our continuing mission to provide you with exceptional heart care, we have created designated Provider Care Teams.  These Care Teams include your primary Cardiologist (physician) and Advanced Practice Providers (APPs -  Physician Assistants and Nurse Practitioners) who all work together to provide you with the care you need, when you need it.  We recommend signing up for the patient portal called "MyChart".  Sign up information is provided on this After Visit Summary.  MyChart is used to connect with patients for Virtual Visits (Telemedicine).  Patients are able to view lab/test results, encounter notes, upcoming appointments, etc.  Non-urgent messages can be sent to your provider as well.   To learn more about what you can do with MyChart, go to [email or web address].    Your next appointment:   3 month(s)  Provider:   Norman Herrlich, MD    Other  Instructions Get a Mobile Kardia or a FitBit watch

## 2023-07-07 DIAGNOSIS — I491 Atrial premature depolarization: Secondary | ICD-10-CM | POA: Diagnosis not present

## 2023-07-10 DIAGNOSIS — R102 Pelvic and perineal pain: Secondary | ICD-10-CM

## 2023-07-10 DIAGNOSIS — N8189 Other female genital prolapse: Secondary | ICD-10-CM | POA: Insufficient documentation

## 2023-07-10 DIAGNOSIS — R3914 Feeling of incomplete bladder emptying: Secondary | ICD-10-CM | POA: Insufficient documentation

## 2023-07-10 HISTORY — DX: Feeling of incomplete bladder emptying: R39.14

## 2023-07-10 HISTORY — DX: Other female genital prolapse: N81.89

## 2023-07-10 HISTORY — DX: Pelvic and perineal pain: R10.2

## 2023-07-11 ENCOUNTER — Ambulatory Visit: Payer: TRICARE For Life (TFL)

## 2023-07-12 ENCOUNTER — Ambulatory Visit: Payer: Medicare Other

## 2023-07-12 VITALS — BP 158/66 | HR 56 | Temp 97.8°F | Resp 16 | Ht 61.0 in | Wt 154.8 lb

## 2023-07-12 DIAGNOSIS — J4540 Moderate persistent asthma, uncomplicated: Secondary | ICD-10-CM | POA: Diagnosis not present

## 2023-07-12 MED ORDER — DUPILUMAB 300 MG/2ML ~~LOC~~ SOSY
300.0000 mg | PREFILLED_SYRINGE | Freq: Once | SUBCUTANEOUS | Status: AC
Start: 2023-07-12 — End: 2023-07-12
  Administered 2023-07-12: 300 mg via SUBCUTANEOUS

## 2023-07-12 NOTE — Progress Notes (Signed)
Diagnosis: Asthma  Provider:  Chilton Greathouse MD  Procedure: Injection  Dupixent (Dupilumab), Dose: 300 mg, Site: subcutaneous, Number of injections: 1  Post Care:     Discharge: Condition: Good, Destination: Home . AVS Declined  Performed by:  Garnette Czech, RN

## 2023-07-24 ENCOUNTER — Telehealth: Payer: Self-pay | Admitting: Internal Medicine

## 2023-07-24 NOTE — Telephone Encounter (Signed)
Patient is on dupixent injections. States she has been on it since spring 2024. She has also started Perlia over the summer Her joints are aching and her hands are swelling. No other symptoms. She is unsure which medication is causing it or if this ia related to arthritis. No acutes available at time of call.

## 2023-07-25 NOTE — Telephone Encounter (Signed)
Dupixent can cause joint pains. Prolia can cause a variety of problems. I can't tell why she is having these symptoms now.  Since her breathing is good, suggest she skip Dupixent for next couple of doses, till current symptoms resolve. Then retry Dupixent and see how she does.

## 2023-07-25 NOTE — Telephone Encounter (Signed)
Primary Pulmonologist: Young Last office visit and with whom: 03/06/2023 Young What do we see them for (pulmonary problems): Asthma, asthmatic bronchitis with acute exacerbation, GI reflux Last OV assessment/plan:   Currently controlled but needs to stay out of hot humid weather outdoors.  We reviewed medications.  Dupixent definitely helpful. Plan-continue current meds      Patient Instructions by Waymon Budge, MD at 03/06/2023 11:30 AM  Author: Waymon Budge, MD Author Type: Physician Filed: 03/06/2023 11:43 AM  Note Status: Signed Cosign: Cosign Not Required Encounter Date: 03/06/2023  Editor: Waymon Budge, MD (Physician)             Wixela/ Advair refill sent to [facility]   I'm glad Dupixent has been helpful- we can continue.   Please let us know if we can help       Orthostatic Vitals Recorded in This Encounter   03/06/2023 1125     Patient Position: Sitting  BP Location: Left Arm  Cuff Size: Normal   Instructions   Return in about 1 year (around 03/05/2024). Wixela/ Advair refill sent to [facility]   I'm glad Dupixent has been helpful- we can continue.   Please let us know if we can help      Was appointment offered to patient (explain)?  No, none available   Reason for call: I called and spoke with patient, she started Prolia in July, she has been on dupixent every 2 weeks since spring of 2024.  She started taking magnesium 325 mg several weeks ago. A week ago Sunday (11/24), she could not get out of chair, she just ached all over.  She sat there for hours until it got better.  She has sciatia and it was worse than normal.  Her arthritis is worse than normal.  Her hands and joints swelled and hurt. She has been having trouble sleeping at night.  She felt like she had pin pricks Sunday and Monday.  She has fibromyalgia, but never had anything like this.  Today she is doing better, but this has been very scary for her. She called  Orthopedics and PCP.  No one could see her.  Her last dose of dupixent ion 11/21, next dose due on Thursday (12/5).  She has no breathing problems at all.  She wants to know what is causing this extreme muscle pain/swelling.  Advised I would send a message to Dr. Maple Hudson and once we hear back from him we will call her back with his recommendations.   (examples of things to ask: : When did symptoms start? Fever? Cough? Productive? Color to sputum? More sputum than usual? Wheezing? Have you needed increased oxygen? Are you taking your respiratory medications? What over the counter measures have you tried?)  Allergies  Allergen Reactions   Iodinated Contrast Media Shortness Of Breath and Swelling    Swelling in facial/No issues with shellfish    Azithromycin Hives    No problem with Biaxin   Levofloxacin     Tendon tears   Misc. Sulfonamide Containing Compounds    Montelukast Other (See Comments)   Montelukast Sodium Swelling   Cefaclor Rash   Penicillins Swelling   Sulfa Antibiotics Rash   Sulfonamide Derivatives Rash    Immunization History  Administered Date(s) Administered   Fluad Quad(high Dose 65+) 06/06/2019, 06/10/2020, 05/27/2021, 07/17/2022   Influenza Split 05/30/2011, 05/31/2012, 06/16/2014, 05/25/2015   Influenza Whole 05/21/2008, 06/07/2010   Influenza, High Dose Seasonal PF 05/26/2016, 06/13/2017,  06/14/2018   Influenza,inj,Quad PF,6+ Mos 06/17/2013   PFIZER Comirnaty(Gray Top)Covid-19 Tri-Sucrose Vaccine 08/24/2019, 09/28/2019   Pneumococcal Conjugate-13 07/10/2013   Pneumococcal Polysaccharide-23 08/22/1998   Tdap 02/28/2011   Zoster, Live 05/09/2016

## 2023-07-27 ENCOUNTER — Encounter: Payer: Self-pay | Admitting: *Deleted

## 2023-07-27 MED ORDER — DUPILUMAB 300 MG/2ML ~~LOC~~ SOSY
300.0000 mg | PREFILLED_SYRINGE | Freq: Once | SUBCUTANEOUS | Status: DC
  Filled 2023-07-27: qty 2

## 2023-07-27 NOTE — Telephone Encounter (Signed)
Nothing further needed 

## 2023-07-27 NOTE — Telephone Encounter (Signed)
Patient said sorry she missed the call and will cancel her dupixent shot for today based upon MyChart message.

## 2023-07-27 NOTE — Telephone Encounter (Signed)
ATC patient.  No answer.  Left detailed vm per DPR.  Sent mychart message as well.  Will leave open in case she calls back with questions.

## 2023-07-28 ENCOUNTER — Encounter (HOSPITAL_BASED_OUTPATIENT_CLINIC_OR_DEPARTMENT_OTHER): Payer: Self-pay

## 2023-07-28 ENCOUNTER — Ambulatory Visit (HOSPITAL_BASED_OUTPATIENT_CLINIC_OR_DEPARTMENT_OTHER)
Admission: EM | Admit: 2023-07-28 | Discharge: 2023-07-28 | Disposition: A | Discharge status: Home / Self Care | Payer: Medicare Other | Attending: Internal Medicine | Admitting: Internal Medicine

## 2023-07-28 DIAGNOSIS — M62831 Muscle spasm of calf: Secondary | ICD-10-CM | POA: Diagnosis not present

## 2023-07-28 DIAGNOSIS — M25562 Pain in left knee: Secondary | ICD-10-CM | POA: Diagnosis not present

## 2023-07-28 MED ORDER — BACLOFEN 5 MG PO TABS: 5.0000 mg | ORAL_TABLET | Freq: Three times a day (TID) | ORAL | 0 refills | Status: AC | PRN

## 2023-07-28 NOTE — Discharge Instructions (Addendum)
Take tylenol 1,000mg  every 6 hours as needed for pain.  Take baclofen 5mg  every 8 hours as needed for muscle spasms and calf pain. Baclofen can make you sleepy, so make sure you do not take this medicine with other medications that can cause drowsiness too such as your gabapentin.  Apply heat 20 minutes on 20 minutes off to the left knee/calf and perform gentle stretches.  If you develop any new or worsening symptoms or if your symptoms do not start to improve, please return here or follow-up with your primary care provider. If your symptoms are severe, please go to the emergency room.

## 2023-07-28 NOTE — ED Provider Notes (Signed)
[facility]    CSN: [id number] Arrival date & time: 07/28/23  1750      History   Chief Complaint Chief Complaint  Patient presents with   Knee Pain    HPI Debra Byrd is a 77 y.o. female.   Debra Byrd is a 77 y.o. female presenting for chief complaint of left Knee Pain that started approximately 1 week ago. Pain started without trauma/injury to the left knee and has been worsening gradually since onset. Left knee pain is triggered and worsened by movement and certain resting positions. Additionally reports generalized pain to the body, particularly to both hands, but relates this to increased time spent standing to cook, clean, and bake for the recent Thanksgiving holiday. Denies recent falls, trauma, and injuries to the body. Pain is mostly localized to the left calf and skin is without redness, warmth, or rash. No recent long periods of travel or sitting reported. History of osteoarthritis and osteoporosis. She is concerned this may have been caused/worsened by Dupixent injection that she received in April 2024 (asthma) and Prolea injection she received in July 2024 (osteoporosis). Denies history of DVT. Pain improves slightly after she takes her Gabapentin (neuropathy/PAD at baseline) and tylenol.    Knee Pain   Past Medical History:  Diagnosis Date   Allergic rhinitis, cause unspecified    Asthma    Chronic airway obstruction, not elsewhere classified    Coronary atherosclerosis of unspecified type of vessel, native or graft    COVID-19 09/2020   Mitral valve disorders(424.0)    Mononeuritis of unspecified site    Myalgia and myositis, unspecified    Peripheral vascular disease, unspecified (HCC)    Sleep disturbance, unspecified    Unspecified glaucoma(365.9)     Patient Active Problem List   Diagnosis Date Noted   Preoperative clearance 09/13/2022   Atelectasis of left lung 12/04/2020   Atypical chest pain 12/02/2020   Moderate persistent asthmatic  bronchitis with exacerbation 10/19/2020   COVID-19 virus infection 10/16/2020   COVID-19 09/2020   Cystocele with prolapse 06/15/2020   Depression 04/18/2020   Acute left flank pain 04/09/2020   Prediabetes 02/28/2020   Pain in right shoulder 11/20/2019   Dysfunction of right eustachian tube 09/07/2019   Vertigo 08/27/2019   Trochanteric bursitis, left hip 05/28/2019   Low back pain 05/28/2019   Fibromyalgia 05/28/2019   Spondylolisthesis, lumbar region 05/28/2019   SI joint arthritis (HCC) 05/28/2019   Encounter for Medicare annual wellness exam 01/23/2018   Loose stools 01/09/2018   Non-intractable vomiting with nausea 01/09/2018   Drug allergy, antibiotic 08/24/2017   Lower respiratory infection (e.g., bronchitis, pneumonia, pneumonitis, pulmonitis) 08/03/2017   GERD (gastroesophageal reflux disease) 03/20/2017   Impaired fasting glucose 05/26/2016   Allergic rhinitis with postnasal drip 10/11/2015   APC (atrial premature contractions) 07/06/2015   Essential hypertension 07/06/2015   Hyperlipidemia 07/06/2015   Non-rheumatic mitral regurgitation 07/06/2015   Palpitation 07/06/2015   Stenosis of right carotid artery 07/06/2015   Asthma, moderate persistent 02/12/2010   Coronary atherosclerosis 08/13/2008   PERIPHERAL VASCULAR DISEASE 08/13/2008   Coronary atherosclerosis 08/13/2008   ACUTE MUCOID OTITIS MEDIA 08/08/2007   Mononeuritis 06/04/2007   Unspecified glaucoma 06/04/2007   Mitral valve disease 06/04/2007   Seasonal and perennial allergic rhinitis 06/04/2007   Chronic hoarseness 06/04/2007   FIBROMYALGIA 06/04/2007   Mitral valve disease 06/04/2007   Mononeuritis 06/04/2007    Past Surgical History:  Procedure Laterality Date   ROTATOR CUFF REPAIR  OB History   No obstetric history on file.      Home Medications    Prior to Admission medications   Medication Sig Start Date End Date Taking? Authorizing Provider  Baclofen 5 MG TABS Take 1 tablet  (5 mg total) by mouth every 8 (eight) hours as needed. 07/28/23  Yes Carlisle Beers, FNP  albuterol (PROAIR HFA) 108 (90 Base) MCG/ACT inhaler Inhale 2 puffs into the lungs every 6 (six) hours as needed for wheezing. 12/14/22   Waymon Budge, MD  albuterol (PROVENTIL) (2.5 MG/3ML) 0.083% nebulizer solution Take 3 mLs (2.5 mg total) by nebulization 4 (four) times daily as needed for wheezing. 12/14/22   Waymon Budge, MD  alclomethasone (ACLOVATE) 0.05 % ointment Apply 1 application topically as needed (rash). 10/31/19   [provider]  Ascorbic Acid (VITAMIN C) 1000 MG tablet Take 1,000 mg by mouth daily.    [provider]  aspirin EC 81 MG tablet Take 81 mg by mouth daily. Swallow whole.    [provider]  calcium gluconate 500 MG tablet Take 500 mg by mouth 2 (two) times daily.      [provider]  cholecalciferol (VITAMIN D) 1000 UNITS tablet Take 3,000 Units by mouth daily.    [provider]  clindamycin (CLEOCIN) 150 MG capsule Take 150 mg by mouth 4 (four) times daily. Prior to Dental Procedures 05/31/21   [provider]  clopidogrel (PLAVIX) 75 MG tablet TAKE 1 TABLET BY MOUTH EVERY DAY 03/29/23   Baldo Daub, MD  Coenzyme Q10 (COQ10) 100 MG CAPS Take 1 capsule by mouth daily.    [provider]  diclofenac sodium (VOLTAREN) 1 % GEL Apply 4 g topically 4 (four) times daily. 05/29/19   Valeria Batman, MD  DUPIXENT 300 MG/2ML prefilled syringe INJECT 1 SYRINGE UNDER THE SKIN EVERY 14 DAYS 06/30/23   Jetty Duhamel D, MD  DUREZOL 0.05 % EMUL Apply 1 mL topically as needed (dry skin). 02/21/19   [provider]  EPINEPHrine 0.3 mg/0.3 mL IJ SOAJ injection Inject 0.3 mg into the muscle as needed for anaphylaxis. 12/14/22   Waymon Budge, MD  famotidine (PEPCID) 40 MG tablet Take 40 mg by mouth 2 (two) times daily. 01/21/21   [provider]  finasteride (PROSCAR) 5 MG tablet Take 2.5 mg by mouth daily.  10/16/19   [provider]  fish oil-omega-3 fatty acids 1000 MG capsule Take 1 g by mouth daily. 4 by mouth daily    [provider]  fluticasone (FLONASE) 50 MCG/ACT nasal spray Place 2 sprays into both nostrils daily. 12/14/22   Jetty Duhamel D, MD  fluticasone-salmeterol (ADVAIR) 250-50 MCG/ACT AEPB Inhale 1 puff into the lungs in the morning and at bedtime. 03/06/23   Waymon Budge, MD  gabapentin (NEURONTIN) 600 MG tablet Take 600 mg by mouth at bedtime. 06/02/21   [provider]  loratadine (CLARITIN) 10 MG tablet Take 1 tablet (10 mg total) by mouth daily. 12/14/22   Waymon Budge, MD  meclizine (ANTIVERT) 12.5 MG tablet Take 12.5 mg by mouth 3 (three) times daily as needed for dizziness. 08/27/19   [provider]  methocarbamol (ROBAXIN) 500 MG tablet Take 500 mg by mouth daily. 08/26/21   [provider]  Multiple Vitamins-Minerals (ZINC PO) Take 1 tablet by mouth daily. Unknown strength    [provider]  nitroGLYCERIN (NITROSTAT) 0.4 MG SL tablet Place 0.4 mg under the  tongue every 5 (five) minutes as needed for chest pain. 12/06/22   [provider]  omeprazole (PRILOSEC) 40 MG capsule Take 40 mg by mouth in the morning and at bedtime. 03/13/22   [provider]  potassium chloride (KLOR-CON) 10 MEQ tablet TAKE 1 TABLET BY MOUTH EVERY DAY 05/03/23   Baldo Daub, MD  pyridOXINE (VITAMIN B-6) 25 MG tablet Take 25 mg by mouth daily.    [provider]  RESTASIS 0.05 % ophthalmic emulsion Place 1 drop into both eyes 2 (two) times daily. 01/14/20   [provider]  rosuvastatin (CRESTOR) 5 MG tablet TAKE 1 TABLET (5 MG TOTAL) BY MOUTH DAILY. 04/17/23   Baldo Daub, MD  sucralfate (CARAFATE) 1 GM/10ML suspension Take 10 mLs (1 g total) by mouth at bedtime. Patient not taking: Reported on 07/06/2023 12/23/22   Baldo Daub, MD  Tafluprost, PF, 0.0015 % SOLN Apply to eye.    [provider]   telmisartan (MICARDIS) 20 MG tablet Take 1 tablet (20 mg total) by mouth at bedtime. 03/26/19   Baldo Daub, MD  vitamin B-12 (CYANOCOBALAMIN) 1000 MCG tablet Take 1,000 mcg by mouth daily.    [provider]    Family History Family History  Problem Relation Age of Onset   Heart disease Father    Emphysema Maternal Uncle     Social History Social History   Tobacco Use   Smoking status: Never    Passive exposure: Never   Smokeless tobacco: Never  Vaping Use   Vaping status: Never Used  Substance Use Topics   Alcohol use: No   Drug use: No     Allergies   Iodinated contrast media, Azithromycin, Levofloxacin, Misc. sulfonamide containing compounds, Montelukast, Montelukast sodium, Cefaclor, Penicillins, Sulfa antibiotics, and Sulfonamide derivatives   Review of Systems Review of Systems Per HPI  Physical Exam Triage Vital Signs ED Triage Vitals [07/28/23 1804]  Encounter Vitals Group     BP (!) 178/78     Systolic BP Percentile      Diastolic BP Percentile      Pulse Rate 68     Resp 20     Temp 97.7 F (36.5 C)     Temp Source Oral     SpO2 98 %     Weight      Height      Head Circumference      Peak Flow      Pain Score      Pain Loc      Pain Education      Exclude from Growth Chart    No data found.  Updated Vital Signs BP (!) 178/78 (BP Location: Right Arm)   Pulse 68   Temp 97.7 F (36.5 C) (Oral)   Resp 20   Wt 153 lb (69.4 kg)   SpO2 98%   BMI 28.91 kg/m   Visual Acuity Right Eye Distance:   Left Eye Distance:   Bilateral Distance:    Right Eye Near:   Left Eye Near:    Bilateral Near:     Physical Exam Vitals and nursing note reviewed.  Constitutional:      Appearance: She is not ill-appearing or toxic-appearing.  HENT:     Head: Normocephalic and atraumatic.     Right Ear: Hearing and external ear normal.     Left Ear: Hearing and external ear normal.     Nose: Nose normal.     Mouth/Throat:  Lips: Pink.   Eyes:     General: Lids are normal. Vision grossly intact. Gaze aligned appropriately.     Extraocular Movements: Extraocular movements intact.     Conjunctiva/sclera: Conjunctivae normal.  Pulmonary:     Effort: Pulmonary effort is normal.  Musculoskeletal:     Cervical back: Neck supple.     Right knee: Normal.     Left knee: No swelling, deformity, effusion, erythema, ecchymosis, lacerations, bony tenderness or crepitus. Decreased range of motion (decreased ROM with flexion at the left knee joint secondary to pain "tightness" to the anterior left knee). No tenderness. Normal alignment, normal meniscus and normal patellar mobility.     Left lower leg: Swelling (Trace non-pitting soft tissue swelling of left calf) and tenderness (Tender to palpation at the left proximal posterior calf) present. No bony tenderness. No edema.       Legs:     Comments: +1 left popliteal pulse, +1 left dorsalis pedis pulse. No warmth, erythema, or rash overlying skin. Homans sign negative bilateral lower extremities.   Skin:    General: Skin is warm and dry.     Capillary Refill: Capillary refill takes less than 2 seconds.     Findings: No rash.  Neurological:     General: No focal deficit present.     Mental Status: She is alert and oriented to person, place, and time. Mental status is at baseline.     Cranial Nerves: No dysarthria or facial asymmetry.     Motor: No weakness.     Gait: Gait (ambulatory with steady gait without assistance) normal.     Comments: Strength and sensation intact to bilateral upper and lower extremities (5/5). Moves all 4 extremities with normal coordination voluntarily. Non-focal neuro exam.   Psychiatric:        Mood and Affect: Mood normal.        Speech: Speech normal.        Behavior: Behavior normal.        Thought Content: Thought content normal.        Judgment: Judgment normal.      UC Treatments / Results  Labs (all labs ordered are listed, but only abnormal  results are displayed) Labs Reviewed - No data to display  EKG   Radiology No results found.  Procedures Procedures (including critical care time)  Medications Ordered in UC Medications - No data to display  Initial Impression / Assessment and Plan / UC Course  I have reviewed the triage vital signs and the nursing notes.  Pertinent labs & imaging results that were available during my care of the patient were reviewed by me and considered in my medical decision making (see chart for details).   1. Muscle spasm of calf, acute pain of left knee Suspect left knee pain and spasm secondary to recent increase in physical activity over the recent holiday. This has likely triggered osteoarthritis flare.  Hesitant to prescribe steroid given osteoporosis. Will treat muscle spasm component with baclofen 5mg  every 8 hours as needed for spasm, drowsiness precautions discussed.  Low suspicion for DVT. Neurovascularly intact distally to pain/injury.  May follow-up with PCP as needed for worsening symptoms.  Counseled patient on potential for adverse effects with medications prescribed/recommended today, strict ER and return-to-clinic precautions discussed, patient verbalized understanding.    Final Clinical Impressions(s) / UC Diagnoses   Final diagnoses:  Muscle spasm of left calf  Acute pain of left knee     Discharge Instructions  Take tylenol 1,000mg  every 6 hours as needed for pain.  Take baclofen 5mg  every 8 hours as needed for muscle spasms and calf pain. Baclofen can make you sleepy, so make sure you do not take this medicine with other medications that can cause drowsiness too such as your gabapentin.  Apply heat 20 minutes on 20 minutes off to the left knee/calf and perform gentle stretches.  If you develop any new or worsening symptoms or if your symptoms do not start to improve, please return here or follow-up with your primary care provider. If your symptoms are  severe, please go to the emergency room.     ED Prescriptions     Medication Sig Dispense Auth. Provider   Baclofen 5 MG TABS Take 1 tablet (5 mg total) by mouth every 8 (eight) hours as needed. 20 tablet Carlisle Beers, FNP      PDMP not reviewed this encounter.   Carlisle Beers, [place] 07/28/23 [phone]

## 2023-07-28 NOTE — ED Triage Notes (Signed)
Pain behind left knee x 1 week. Very painful over last few days. States pain to lower part of left leg as well. No redness noted to lower extremity. Patient take 2 meds for Asthma and Osteoporosis. Dupixent and Prolea. Both can cause joint pain at any time during treatment. Good pedal pulse.

## 2023-07-30 ENCOUNTER — Encounter: Payer: Self-pay | Admitting: Cardiology

## 2023-08-03 ENCOUNTER — Telehealth: Payer: Self-pay | Admitting: Internal Medicine

## 2023-08-03 NOTE — Telephone Encounter (Signed)
PT calling she is off Dupixant and wanted to follow up with Dr. Maple Hudson in person. Can he call or can we use an held spot? [phone]

## 2023-08-03 NOTE — Telephone Encounter (Signed)
Ok to use held spot 

## 2023-08-04 NOTE — Telephone Encounter (Signed)
Made appt. PT very grateful to Dr. Jeannie Fend. NFN

## 2023-08-06 NOTE — Progress Notes (Unsigned)
Subjective:    Patient ID: Debra Byrd, female    DOB: [birth year], 77 y.o.   MRN: [id number]  HPI Female never smoker followed for chronic obstructive asthma/Xolair/often steroid dependent, complicated by allergic rhinitis, peripheral neuropathy, glaucoma,  Offce Spirometry 08/08/2016-NL. FVC 2.04/77%, FEV1 1.64/81%, FEV1/FVC 0.80 HST 07/26/13- WNL  AHI 4.4/ hr, desaturation to 73%, body weight 175 lbs FeNO elevated =============================================================   11/15/22- 77 year old female never smoker followed for chronic obstructive asthma/Xolair/ often steroid dependent, complicated by allergic rhinitis, peripheral neuropathy, Glaucoma, Covid infection Feb 2022, GERD,  -Advair 250, pro-air HFA, Xolair, neb albuterol, benzonatate Covid vax-2 [employer] Continuing Xolair until Dupixent is approved. Unable to self-inject- blames poor eyesight. LOV Cobb, NP 09/13/22- preop clearance for upper and lower endoscopy ------Pt states she is doing okay Was having increased cough earlier in the spring, much improved now.  Sleeps with head elevated on a wedge.  Has had workup for aggressive reflux.  Did have endoscopy and is pending swallow evaluation and pH monitor. Had questions about RSV vaccine. Changing to Dupixent.  Has felt very dependent on Biologic therapy, starting with Xolair, for dramatic improvement in asthma control over the years. CXR 08/30/22-  IMPRESSION: Left basilar scar or atelectasis.  Mild peribronchial thickening  03/06/23- 77 year old female never smoker followed for chronic obstructive asthma/Xolair/ often steroid dependent, complicated by allergic rhinitis, peripheral neuropathy, Glaucoma, Covid infection Feb 2022, GERD,  -Wixela 250, pro-air HFA, Dupixent, neb albuterol, benzonatate  -----Breathing has been ok   ACT 19 She does not notice any real difference between Xolair and Dupixent, except that nasal stuffiness seemed to clear faster after Xolair injection.   Both have definitely improved control of her asthma.  Much acid reflux being followed by GI.  08/08/23- 77 year old female never smoker followed for chronic obstructive asthma/Xolair/ often steroid dependent, complicated by allergic rhinitis, peripheral neuropathy, Glaucoma, Covid infection Feb 2022, GERD,  -Wixela 250/ Advair, pro-air HFA, Dupixent, neb albuterol, benzonatate Off Dupixent??    PCP office visit 08/02/23- Pt presents to office to discuss sleep/joint pain. All started when she got a dupixent injection on 07/13/2023 then started o have swelling in hands and felt like legs were tight and swelling. Could not get out of the chair. Right toes hurt, left knee pain could not bend the knee. Went to UC on 07/28/23 and got a muscle relaxer and on 08/01/23 she has burning sensation on skin, feels like pen pricks on skin. Burning sensation on spine. Not able to sleep.  Was advised to stop dupixent for now.  Discussed the use of AI scribe software for clinical note transcription with the patient, who gave verbal consent to proceed.  History of Present Illness   The patient, with a history of asthma and fibromyalgia, presents with severe and widespread joint pains. The pains were so severe that the patient compared the experience to being in a car wreck. The patient was given a Solu Medrol injection by their family doctor to help manage the pain. The patient also has a history of peripheral neuropathy and sciatic pain, which have been ongoing for years. The patient was previously on Dupixent, but has stopped taking it due to concerns that it might be causing the joint pains. The patient's asthma is currently under control.     ROS-see HPI-+ = positive Constitutional:   No-   weight loss, night sweats, fevers, chills, +fatigue, lassitude. HEENT:   No-   difficulty swallowing, tooth/dental problems, sore throat,  No-  sneezing, itching, ear ache,  nasal congestion, post nasal drip,  CV:  +  Noncardiac chest pain, no- orthopnea, PND, swelling in lower extremities,                                           anasarca, dizziness, palpitations Resp:+shortness of breath with exertion or at rest.               productive cough,   non-productive cough,  No- coughing up of blood.                change in color of mucus.  +Little wheezing.   Skin: No-   rash or lesions. GI:  No-   heartburn, indigestion, abdominal pain, nausea, vomiting,  GU: pain. MS:  +joint pain or swelling.   Neuro-     nothing unusualk pain. Psych:  No- change in mood or affect. No depression or anxiety.  No memory loss.   Objective:   Physical Exam              General- Alert, Oriented, Affect-appropriate, Distress-none acute, + overweight,  Skin- rash-none, lesions- none, excoriation- none Lymphadenopathy- none Head- atraumatic            Eyes- Gross vision intact, PERRLA, conjunctivae clear secretions. Chronic periorbital edema.            Ears- Hearing, canals-normal            Nose- Clear, no-Septal dev, mucus, polyps, erosion, perforation             Throat- Mallampati II-III , mucosa clear , drainage- none, tonsils- atrophic,  Neck- flexible , trachea midline, no stridor , thyroid nl, carotid no bruit Chest - symmetrical excursion , unlabored           Heart/CV- RRR , no murmur , no gallop  , no rub, nl s1 s2                           - JVD- none , edema- none, stasis changes- none, varices- none           Lung- clear, unlabored, dullness-none, rub- none,  Cough- none           Chest wall-  Abd-  Br/ Gen/ Rectal- Not done, not indicated Extrem- cyanosis- none, clubbing, none, atrophy- none, strength- nl Neuro- grossly intact to observation  Assessment and Plan    Polyarthralgia Severe joint pain following Dupixent injection on 07/13/2023. Pain improved with Solu-Medrol injection on 08/02/2023 and Glycophos on 07/28/2023. Possible association with Dupixent. -Discontinue Dupixent. -Return for  follow-up after the first of the year to reassess joint pain.  Asthma Currently well-controlled. -Continue current asthma management plan. -Consider alternative biologic therapies if needed in the future.  Peripheral Neuropathy and Sciatica Chronic conditions with recent exacerbation of symptoms, including a burning sensation. -Continue current management strategies. -Return for follow-up after the first of the year to reassess symptoms.

## 2023-08-08 ENCOUNTER — Encounter: Payer: Self-pay | Admitting: Internal Medicine

## 2023-08-08 ENCOUNTER — Ambulatory Visit: Payer: Medicare Other | Admitting: Internal Medicine

## 2023-08-08 VITALS — BP 130/78 | HR 65 | Temp 97.3°F | Ht 61.0 in | Wt 154.0 lb

## 2023-08-08 DIAGNOSIS — M2550 Pain in unspecified joint: Secondary | ICD-10-CM

## 2023-08-08 DIAGNOSIS — J4541 Moderate persistent asthma with (acute) exacerbation: Secondary | ICD-10-CM

## 2023-08-08 HISTORY — DX: Pain in unspecified joint: M25.50

## 2023-08-08 NOTE — Assessment & Plan Note (Signed)
Polyarthralgia after Dupixent, with hx fibromyalgia, sciatica and ?osteoarthritis. Recent steroids, so unable to check labs for inflammatory markers.  Asthma is currently controlled Plan- stay off Dupixent. Reconsider on f/u.

## 2023-08-08 NOTE — Patient Instructions (Signed)
We will stay off Dupixent now. Let's get you back in a few weeks and see how you are doing.

## 2023-08-08 NOTE — Assessment & Plan Note (Signed)
Currently uncomplicated Plan- continue current meds except Dupixent

## 2023-08-10 ENCOUNTER — Ambulatory Visit: Payer: Medicare Other

## 2023-08-21 ENCOUNTER — Encounter: Payer: Self-pay | Admitting: Internal Medicine

## 2023-08-24 ENCOUNTER — Ambulatory Visit: Payer: Medicare Other

## 2023-09-05 ENCOUNTER — Other Ambulatory Visit (HOSPITAL_COMMUNITY): Payer: Self-pay

## 2023-09-05 ENCOUNTER — Ambulatory Visit: Payer: Medicare Other | Admitting: Internal Medicine

## 2023-09-05 ENCOUNTER — Encounter: Payer: Self-pay | Admitting: Internal Medicine

## 2023-09-05 VITALS — BP 132/74 | HR 51 | Temp 98.1°F | Ht 61.0 in | Wt 150.8 lb

## 2023-09-05 DIAGNOSIS — M2550 Pain in unspecified joint: Secondary | ICD-10-CM | POA: Diagnosis not present

## 2023-09-05 DIAGNOSIS — J45909 Unspecified asthma, uncomplicated: Secondary | ICD-10-CM

## 2023-09-05 DIAGNOSIS — Q782 Osteopetrosis: Secondary | ICD-10-CM

## 2023-09-05 NOTE — Patient Instructions (Signed)
 Order- Rheumatology referral to [facility] - Dr Deanne Coffer and associates Dx myalgias and arthralgias after Dupixent.  We can continue current asthma meds

## 2023-09-05 NOTE — Progress Notes (Signed)
 Subjective:    Patient ID: Debra Byrd, female    DOB: 1946-06-11, 78 y.o.   MRN: [id number]  HPI Female never smoker followed for chronic obstructive asthma/Xolair /often steroid dependent, complicated by allergic rhinitis, peripheral neuropathy, glaucoma,  Offce Spirometry 08/08/2016-NL. FVC 2.04/77%, FEV1 1.64/81%, FEV1/FVC 0.80 HST 07/26/13- WNL  AHI 4.4/ hr, desaturation to 73%, body weight 175 lbs FeNO elevated =============================================================   08/08/23- 78 year old female never smoker followed for chronic obstructive asthma/Xolair / often steroid dependent, complicated by allergic rhinitis, peripheral neuropathy, Glaucoma, Covid infection Feb 2022, GERD,  -Wixela 250/ Advair, pro-air HFA, Dupixent , neb albuterol , benzonatate  Off Dupixent ??    PCP office visit 08/02/23- Pt presents to office to discuss sleep/joint pain. All started when she got a dupixent  injection on 07/13/2023 then started o have swelling in hands and felt like legs were tight and swelling. Could not get out of the chair. Right toes hurt, left knee pain could not bend the knee. Went to UC on 07/28/23 and got a muscle relaxer and on 08/01/23 she has burning sensation on skin, feels like pen pricks on skin. Burning sensation on spine. Not able to sleep.  Was advised to stop dupixent  for now.  Discussed the use of AI scribe software for clinical note transcription with the patient, who gave verbal consent to proceed. History of Present Illness   The patient, with a history of asthma and fibromyalgia, presents with severe and widespread joint pains. The pains were so severe that the patient compared the experience to being in a car wreck. The patient was given a Solu Medrol  injection by their family doctor to help manage the pain. The patient also has a history of peripheral neuropathy and sciatic pain, which have been ongoing for years. The patient was previously on Dupixent , but has stopped  taking it due to concerns that it might be causing the joint pains. The patient's asthma is currently under control.   09/05/23- 78 year old female never smoker followed for chronic obstructive asthma/Xolair / often steroid dependent, complicated by allergic rhinitis, peripheral neuropathy, Glaucoma, Covid infection Feb 2022, GERD,  -Wixela 250/ Advair, pro-air HFA, Dupixent , neb albuterol , benzonatate  Off Dupixent  after ?? Allergic Reaction with arthralgias after Dupixent  injection 07/13/23. Ongoing but shifting pain and stiffness especially in the hands (now improved) and legs.  Discussed the use of AI scribe software for clinical note transcription with the patient, who gave verbal consent to proceed. History of Present Illness   The patient, with a history of asthma and osteoporosis/ Prolia, presents with joint pain that started after receiving a Dupixent  injection in November. The patient reports intense thigh pain that woke her up in the middle of the night, fearing a fracture. The patient also experienced severe knee pain and swelling in the hands, which has since improved. The pain was so severe that the patient needed assistance to get out of bed and has been using a cane for mobility. The patient also reports body pain, describing it as deep.   The patient had been managing her asthma with Dupixent , but has stopped the medication due to the joint pain. The patient reports only noticing wheezing two days ago and has been careful to avoid triggers. The patient also has osteoporosis and has been on Prolia since July of the previous year. The patient suspects that Prolia might be causing the joint and muscle pain. The patient is due for a bone density test later in the month. There has been concern that her muscle and joint pains  were triggered by Dupixent  and/ or Prolia, although it has been several months since she got either med.  Her Sports Medicine doctor suggested she see a Rheumatologist and  she asks me for that referral.    ROS-see HPI-+ = positive Constitutional:   No-   weight loss, night sweats, fevers, chills, +fatigue, lassitude. HEENT:   No-   difficulty swallowing, tooth/dental problems, sore throat,       No-  sneezing, itching, ear ache,  nasal congestion, post nasal drip,  CV:  + Noncardiac chest pain, no- orthopnea, PND, swelling in lower extremities,                                           anasarca, dizziness, palpitations Resp:+shortness of breath with exertion or at rest.               productive cough,   non-productive cough,  No- coughing up of blood.                change in color of mucus.  +Little wheezing.   Skin: No-   rash or lesions. GI:  No-   heartburn, indigestion, abdominal pain, nausea, vomiting,  GU: pain. MS:  +joint pain or swelling.   Neuro-     nothing unusualk pain. Psych:  No- change in mood or affect. No depression or anxiety.  No memory loss.   Objective:   Physical Exam              General- Alert, Oriented, Affect-appropriate, Distress-none acute, + overweight,  Skin- rash-none, lesions- none, excoriation- none Lymphadenopathy- none Head- atraumatic            Eyes- Gross vision intact, PERRLA, conjunctivae clear secretions. Chronic periorbital edema.            Ears- Hearing, canals-normal            Nose- Clear, no-Septal dev, mucus, polyps, erosion, perforation             Throat- Mallampati II-III , mucosa clear , drainage- none, tonsils- atrophic,  Neck- flexible , trachea midline, no stridor , thyroid nl, carotid no bruit Chest - symmetrical excursion , unlabored           Heart/CV- RRR , no murmur , no gallop  , no rub, nl s1 s2                           - JVD- none , edema- none, stasis changes- none, varices- none           Lung- clear, unlabored, dullness-none, rub- none,  Cough- none           Chest wall-  Abd-  Br/ Gen/ Rectal- Not done, not indicated Extrem- cyanosis- none, clubbing, none, atrophy- none, strength-  nl Neuro- grossly intact to observation  Assessment and Plan    Assessment and Plan    Joint Pain and Swelling Severe pain and swelling in the hands and legs, initially thought to be an allergic reaction to Dupixent . However, the pain persisted and new pains developed even after discontinuation of Dupixent . Recent blood work by Dr. Midge showed normal inflammatory markers, except for slightly low sodium. -She requests we refer to Rheumatologist Dr. Curt for further evaluation and management.  Osteoporosis Currently managed with Prolia injections by Dr. Midge. Patient  has concerns about potential side effects of Prolia, including joint pain and risk of fractures. -Continue current management with Prolia as per Dr. Arie advice. -Scheduled for a bone density test on January 28th.  Asthma Stable with no recent flare-ups since discontinuation of Dupixent . Patient reported one episode of wheezing but has been managing well with regular asthma medications. -Continue current asthma medications. -Schedule follow-up in three months or sooner if symptoms worsen.

## 2023-09-06 ENCOUNTER — Telehealth: Payer: Self-pay | Admitting: Internal Medicine

## 2023-09-06 ENCOUNTER — Telehealth: Payer: Self-pay | Admitting: Pharmacy Technician

## 2023-09-06 NOTE — Telephone Encounter (Signed)
 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

## 2023-09-06 NOTE — Telephone Encounter (Signed)
 Received notification from [facility] regarding a prior authorization for DUPIXENT . Authorization has been APPROVED from 03/07/23 to 03/06/24. Approval letter sent to scan center.   Patient must continue to fill through [facility]: [phone]   Authorization # [id number]

## 2023-09-08 ENCOUNTER — Ambulatory Visit: Payer: Medicare Other | Admitting: Internal Medicine

## 2023-09-14 NOTE — Telephone Encounter (Signed)
FYI:  Pt referred to two different rheumatologist. Dr. Corliss Skains could not see pt until 01/2024 She has been scheduled for an appt with Dr. Deanne Coffer on 10/30/23.

## 2023-10-04 ENCOUNTER — Other Ambulatory Visit: Payer: Self-pay | Admitting: Cardiology

## 2023-10-04 ENCOUNTER — Encounter: Payer: Self-pay | Admitting: Cardiology

## 2023-10-04 DIAGNOSIS — S20419A Abrasion of unspecified back wall of thorax, initial encounter: Secondary | ICD-10-CM

## 2023-10-04 DIAGNOSIS — S300XXA Contusion of lower back and pelvis, initial encounter: Secondary | ICD-10-CM | POA: Insufficient documentation

## 2023-10-04 DIAGNOSIS — S60012A Contusion of left thumb without damage to nail, initial encounter: Secondary | ICD-10-CM

## 2023-10-04 HISTORY — DX: Contusion of lower back and pelvis, initial encounter: S30.0XXA

## 2023-10-04 HISTORY — DX: Contusion of left thumb without damage to nail, initial encounter: S60.012A

## 2023-10-04 HISTORY — DX: Abrasion of unspecified back wall of thorax, initial encounter: S20.419A

## 2023-10-04 NOTE — Telephone Encounter (Signed)
Prescription sent to pharmacy.

## 2023-10-05 NOTE — Progress Notes (Unsigned)
Cardiology Office Note:    Date:  10/06/2023   ID:  Debra Byrd, DOB October 31, 1945, MRN [id number]  PCP:  Leane Call, PA-C  Cardiologist:  Norman Herrlich, MD    Referring MD: Leane Call, PA-C please do magnesium and potassium with her upcoming labs.   ASSESSMENT:    1. APC (atrial premature contractions)   2. Essential hypertension   3. Mixed hyperlipidemia   4. Stenosis of right carotid artery    PLAN:    In order of problems listed above:  Stable having no sustained atrial arrhythmia She will continue to self monitor with the mobile Kardia device With controlled continue her ARB Continue her current lipid-lowering rosuvastatin and she has upcoming labs 10/26/2023 with her PCP She is quite concerned about excessive bruising and with stable carotid disease I think we can drop aspirin and continue clopidogrel Follow-up MRA in 2 years October this year for progression of her carotid stenosis   Next appointment: I will see her in 9 months   Medication Adjustments/Labs and Tests Ordered: Current medicines are reviewed at length with the patient today.  Concerns regarding medicines are outlined above.  No orders of the defined types were placed in this encounter.  No orders of the defined types were placed in this encounter.    History of Present Illness:    Debra Byrd is a 78 y.o. female with a hx of symptomatic APCs hypertension hyperlipidemia right internal carotid artery stenosis and mild mitral regurgitation last seen 07/06/2023.  Following that visit she had a event monitor reported 07/30/2023 with rare ventricular ectopy occasional supraventricular ectopy and brief runs of brief he did APCs a longest 5 complexes.  There were no episodes of atrial fibrillation or flutter.  Triggered events were associated with occasional APCs.  Her last MRA was performed 1024 2023 x 50% stenosis of the proximal right ICA with no interval progression since  2018.  Compliance with diet, lifestyle and medications: Yes Several issues she purchased mobile Lourena Simmonds shows me strips there were 2 possible atrial fibrillation are sinus with APCs She has palpitation but not severe or sustained Home blood pressures run 126/69 132/69 She has had no chest discomfort is never used her nitroglycerin She is due for labs in a few weeks with her PCP I will ask him to go ahead and check a potassium and magnesium if normal I think she could stop her K supplements. Past Medical History:  Diagnosis Date   Acute left flank pain 04/09/2020   ACUTE MUCOID OTITIS MEDIA 08/08/2007   Qualifier: Diagnosis of   By: Maple Hudson MD, Clinton D        Allergic rhinitis with postnasal drip 10/11/2015   Allergic rhinitis, cause unspecified    APC (atrial premature contractions) 07/06/2015   Arthralgia 08/08/2023   Asthma    Asthma, moderate persistent 02/12/2010   PFT 01/2011- FEV1/FVC 0.72; mild to mod obstruction w/ response to bronchodilator  Offce Spirometry 08/08/2016-NL. FVC 2.04/77%, FEV1 1.64/81%, FEV1/FVC 0.80        Atelectasis of left lung 12/04/2020   Atypical chest pain 12/02/2020   Back abrasion 10/04/2023   Chronic airway obstruction, not elsewhere classified    Chronic hoarseness 06/04/2007   Annotation: probable  Qualifier: Diagnosis of   By: Gilman Buttner CMA, Katie         Coccyx contusion 10/04/2023   Contusion of left thumb 10/04/2023   Coronary atherosclerosis 08/13/2008   Qualifier: Diagnosis of   By: Maple Hudson  MD, Joni Fears D     IMO SNOMED Dx Update Oct 2024     Coronary atherosclerosis of unspecified type of vessel, native or graft    COVID-19 09/2020   COVID-19 virus infection 10/16/2020   Cystocele with prolapse 06/15/2020   Depression 04/18/2020   Drug allergy, antibiotic 08/24/2017   Penicillin caused edema, Z-Pak may have caused rash     Dysfunction of right eustachian tube 09/07/2019   Encounter for Medicare annual wellness exam 01/23/2018   Essential  hypertension 07/06/2015   Feeling of incomplete bladder emptying 07/10/2023   Fibromyalgia 05/28/2019   GERD (gastroesophageal reflux disease) 03/20/2017   Hyperlipidemia 07/06/2015   Impaired fasting glucose 05/26/2016   Formatting of this note might be different from the original.  Stable with diet changes     Loose stools 01/09/2018   Low back pain 05/28/2019   Lower respiratory infection (e.g., bronchitis, pneumonia, pneumonitis, pulmonitis) 08/03/2017   Mitral valve disease 06/04/2007   Qualifier: Diagnosis of   By: Gilman Buttner CMA, Katie      IMO SNOMED Dx Update Oct 2024     Mitral valve disorders(424.0)    Moderate persistent asthmatic bronchitis with exacerbation 10/19/2020   Mononeuritis 06/04/2007   Qualifier: Diagnosis of   By: Gilman Buttner CMA, Katie      IMO SNOMED Dx Update Oct 2024     Mononeuritis of unspecified site    Myalgia and myositis, unspecified    Non-intractable vomiting with nausea 01/09/2018   Non-rheumatic mitral regurgitation 07/06/2015   OAB (overactive bladder) 08/26/2022   Pain in right shoulder 11/20/2019   Palpitation 07/06/2015   Pelvic floor weakness 07/10/2023   Pelvic pain 07/10/2023   Peripheral vascular disease, unspecified (HCC)    Prediabetes 02/28/2020   Preoperative clearance 09/13/2022   Seasonal and perennial allergic rhinitis 06/04/2007   Qualifier: Diagnosis of   By: Gilman Buttner CMA, Katie         SI joint arthritis (HCC) 05/28/2019   Sleep disturbance, unspecified    Spondylolisthesis, lumbar region 05/28/2019   Stenosis of right carotid artery 07/06/2015   Overview:   MRA 07/10/14:  At the RIGHT carotid bifurcation, there is estimated 50% stenosis at the RIGHT internal carotid artery origin. Minimal non stenotic atheromatous change LEFT carotid bifurcation. No measurable LEFT ICA disease  Overview:   Overview:   MRA 07/10/14:  At the RIGHT carotid bifurcation, there is estimated 50% stenosis at the RIGHT internal carotid artery origin. Minimal  non s   Trochanteric bursitis, left hip 05/28/2019   Unspecified glaucoma 06/04/2007   Qualifier: Diagnosis of   By: Gilman Buttner CMA, Katie      IMO SNOMED Dx Update Oct 2024     Unspecified glaucoma(365.9)    Urge incontinence 08/26/2022   Uterovaginal prolapse, incomplete 10/21/2022   Vaginal atrophy 08/26/2022   Vertigo 08/27/2019    Current Medications: Current Meds  Medication Sig   albuterol (PROAIR HFA) 108 (90 Base) MCG/ACT inhaler Inhale 2 puffs into the lungs every 6 (six) hours as needed for wheezing.   albuterol (PROVENTIL) (2.5 MG/3ML) 0.083% nebulizer solution Take 3 mLs (2.5 mg total) by nebulization 4 (four) times daily as needed for wheezing.   alclomethasone (ACLOVATE) 0.05 % ointment Apply 1 application topically as needed (rash).   Ascorbic Acid (VITAMIN C) 1000 MG tablet Take 1,000 mg by mouth daily.   aspirin EC 81 MG tablet Take 81 mg by mouth daily. Swallow whole.   Baclofen 5 MG TABS Take 1 tablet (5 mg  total) by mouth every 8 (eight) hours as needed.   calcium gluconate 500 MG tablet Take 500 mg by mouth 2 (two) times daily.     cholecalciferol (VITAMIN D) 1000 UNITS tablet Take 3,000 Units by mouth daily.   clindamycin (CLEOCIN) 150 MG capsule Take 150 mg by mouth 4 (four) times daily. Prior to Dental Procedures   clopidogrel (PLAVIX) 75 MG tablet TAKE 1 TABLET BY MOUTH EVERY DAY   Coenzyme Q10 (COQ10) 100 MG CAPS Take 1 capsule by mouth daily.   diclofenac sodium (VOLTAREN) 1 % GEL Apply 4 g topically 4 (four) times daily.   DUREZOL 0.05 % EMUL Apply 1 mL topically as needed (dry skin).   EPINEPHrine 0.3 mg/0.3 mL IJ SOAJ injection Inject 0.3 mg into the muscle as needed for anaphylaxis.   famotidine (PEPCID) 40 MG tablet Take 40 mg by mouth 2 (two) times daily.   finasteride (PROSCAR) 5 MG tablet Take 2.5 mg by mouth daily.   fish oil-omega-3 fatty acids 1000 MG capsule Take 1 g by mouth daily. 4 by mouth daily   fluticasone (FLONASE) 50 MCG/ACT nasal spray  Place 2 sprays into both nostrils daily.   fluticasone-salmeterol (ADVAIR) 250-50 MCG/ACT AEPB Inhale 1 puff into the lungs in the morning and at bedtime.   gabapentin (NEURONTIN) 600 MG tablet Take 600 mg by mouth at bedtime.   lidocaine (LIDODERM) 5 % Place 1 patch onto the skin daily.   loratadine (CLARITIN) 10 MG tablet Take 1 tablet (10 mg total) by mouth daily.   meclizine (ANTIVERT) 12.5 MG tablet Take 12.5 mg by mouth 3 (three) times daily as needed for dizziness.   methocarbamol (ROBAXIN) 500 MG tablet Take 500 mg by mouth daily.   Multiple Vitamins-Minerals (ZINC PO) Take 1 tablet by mouth daily. Unknown strength   nitroGLYCERIN (NITROSTAT) 0.4 MG SL tablet Place 0.4 mg under the tongue every 5 (five) minutes as needed for chest pain.   omeprazole (PRILOSEC) 40 MG capsule Take 40 mg by mouth in the morning and at bedtime.   potassium chloride (KLOR-CON) 10 MEQ tablet TAKE 1 TABLET BY MOUTH EVERY DAY   promethazine-dextromethorphan (PROMETHAZINE-DM) 6.25-15 MG/5ML syrup Take 5-10 mLs by mouth every 4 (four) hours as needed.   pyridOXINE (VITAMIN B-6) 25 MG tablet Take 25 mg by mouth daily.   RESTASIS 0.05 % ophthalmic emulsion Place 1 drop into both eyes 2 (two) times daily.   rosuvastatin (CRESTOR) 5 MG tablet TAKE 1 TABLET (5 MG TOTAL) BY MOUTH DAILY.   Tafluprost, PF, 0.0015 % SOLN Apply to eye.   telmisartan (MICARDIS) 20 MG tablet Take 1 tablet (20 mg total) by mouth at bedtime.   vitamin B-12 (CYANOCOBALAMIN) 1000 MCG tablet Take 1,000 mcg by mouth daily.      EKGs/Labs/Other Studies Reviewed:    The following studies were reviewed today:  Cardiac Studies & Procedures   ______________________________________________________________________________________________   STRESS TESTS  MYOCARDIAL PERFUSION IMAGING 12/06/2022      MONITORS  LONG TERM MONITOR (3-14 DAYS) 07/28/2023  Narrative Patch Wear Time:  13 days and 3 hours (2024-11-15T03:29:59-0500 to  2024-11-28T07:29:07-499)  Patient had a min HR of 42 bpm, max HR of 138 bpm, and avg HR of 57 bpm. Predominant underlying rhythm was Sinus Rhythm.  There were 4 triggered and 6 diary events.  All sinus rhythm and at times associated with occasional APC.  There were no sinus pauses of 3 seconds or greater and no episodes of second or third-degree AV  nodal block.  There were no episodes of atrial fibrillation or flutter.  3 episode of consecutive APCs occurred, the run with the fastest interval lasting 6 beats with a max rate of 138 bpm, the longest lasting 5 beats with an avg rate of 114 bpm.  Isolated SVEs were occasional (3.1%, 23584), SVE Couplets were rare (<1.0%, 1113), and SVE Triplets were rare (<1.0%, 118).  Isolated VEs were rare (<1.0%), and no VE Couplets or VE Triplets were present. Ventricular Bigeminy was present.       ______________________________________________________________________________________________          Recent Labs: No results found for requested labs within last 365 days.  Recent Lipid Panel    Component Value Date/Time   CHOL 145 08/08/2018 0940   TRIG 85 08/08/2018 0940   HDL 67 08/08/2018 0940   CHOLHDL 2.2 08/08/2018 0940   LDLCALC 61 08/08/2018 0940    Physical Exam:    VS:  BP 138/76   Pulse (!) 56   Ht 5\' 1"  (1.549 m)   Wt 151 lb (68.5 kg)   SpO2 97%   BMI 28.53 kg/m     Wt Readings from Last 3 Encounters:  10/06/23 151 lb (68.5 kg)  09/05/23 150 lb 12.8 oz (68.4 kg)  08/08/23 154 lb (69.9 kg)     GEN:  Well nourished, well developed in no acute distress HEENT: Normal NECK: No JVD; No carotid bruits LYMPHATICS: No lymphadenopathy CARDIAC: RRR, no murmurs, rubs, gallops RESPIRATORY:  Clear to auscultation without rales, wheezing or rhonchi  ABDOMEN: Soft, non-tender, non-distended MUSCULOSKELETAL:  No edema; No deformity  SKIN: Warm and dry NEUROLOGIC:  Alert and oriented x 3 PSYCHIATRIC:  Normal affect     Signed, Norman Herrlich, MD  10/06/2023 3:23 PM    [facility]

## 2023-10-06 ENCOUNTER — Ambulatory Visit: Payer: Medicare Other | Attending: Cardiology | Admitting: Cardiology

## 2023-10-06 ENCOUNTER — Encounter: Payer: Self-pay | Admitting: Internal Medicine

## 2023-10-06 ENCOUNTER — Encounter: Payer: Self-pay | Admitting: Cardiology

## 2023-10-06 ENCOUNTER — Ambulatory Visit (INDEPENDENT_AMBULATORY_CARE_PROVIDER_SITE_OTHER): Payer: Medicare Other | Admitting: Podiatry

## 2023-10-06 VITALS — BP 138/76 | HR 56 | Ht 61.0 in | Wt 151.0 lb

## 2023-10-06 DIAGNOSIS — M79675 Pain in left toe(s): Secondary | ICD-10-CM

## 2023-10-06 DIAGNOSIS — I10 Essential (primary) hypertension: Secondary | ICD-10-CM | POA: Insufficient documentation

## 2023-10-06 DIAGNOSIS — M79674 Pain in right toe(s): Secondary | ICD-10-CM | POA: Diagnosis not present

## 2023-10-06 DIAGNOSIS — I739 Peripheral vascular disease, unspecified: Secondary | ICD-10-CM

## 2023-10-06 DIAGNOSIS — E782 Mixed hyperlipidemia: Secondary | ICD-10-CM | POA: Diagnosis present

## 2023-10-06 DIAGNOSIS — L84 Corns and callosities: Secondary | ICD-10-CM

## 2023-10-06 DIAGNOSIS — I491 Atrial premature depolarization: Secondary | ICD-10-CM | POA: Diagnosis present

## 2023-10-06 DIAGNOSIS — B351 Tinea unguium: Secondary | ICD-10-CM

## 2023-10-06 DIAGNOSIS — I6521 Occlusion and stenosis of right carotid artery: Secondary | ICD-10-CM | POA: Insufficient documentation

## 2023-10-06 NOTE — Patient Instructions (Signed)
Medication Instructions:  Your physician has recommended you make the following change in your medication:   STOP: Aspirin  *If you need a refill on your cardiac medications before your next appointment, please call your pharmacy*   Lab Work: None If you have labs (blood work) drawn today and your tests are completely normal, you will receive your results only by: MyChart Message (if you have MyChart) OR A paper copy in the mail If you have any lab test that is abnormal or we need to change your treatment, we will call you to review the results.   Testing/Procedures: MR angio of carotids in October 2025   Follow-Up: At [facility], you and your health needs are our priority.  As part of our continuing mission to provide you with exceptional heart care, we have created designated Provider Care Teams.  These Care Teams include your primary Cardiologist (physician) and Advanced Practice Providers (APPs -  Physician Assistants and Nurse Practitioners) who all work together to provide you with the care you need, when you need it.  We recommend signing up for the patient portal called "MyChart".  Sign up information is provided on this After Visit Summary.  MyChart is used to connect with patients for Virtual Visits (Telemedicine).  Patients are able to view lab/test results, encounter notes, upcoming appointments, etc.  Non-urgent messages can be sent to your provider as well.   To learn more about what you can do with MyChart, go to [email or web address].    Your next appointment:   9 month(s)  Provider:   Norman Herrlich, MD    Other Instructions None

## 2023-10-06 NOTE — Addendum Note (Signed)
Addended by: Roxanne Mins I on: 10/06/2023 04:21 PM   Modules accepted: Orders

## 2023-10-06 NOTE — Progress Notes (Signed)
Subjective:  Patient ID: Debra Byrd, female    DOB: 18-Aug-1946,  MRN: [id number]  Debra Byrd presents to clinic today for:  Chief Complaint  Patient presents with   [facility]    RFC today with callous care. She is not diabetic and takes ASA 81 and Plavix   Patient notes nails are thick and elongated, causing pain in shoe gear when ambulating.  She has painful calluses at the great toe joint bilateral as well as left submet 2.  She also noted that she is requesting her nails be cut straight across.  She states that most foot doctors like to round off the corners which she does not want.  PCP is Nodal, Joline Salt, PA-C.  Last seen on 08/02/2023  Past Medical History:  Diagnosis Date   Acute left flank pain 04/09/2020   ACUTE MUCOID OTITIS MEDIA 08/08/2007   Qualifier: Diagnosis of   By: Maple Hudson MD, Clinton D        Allergic rhinitis with postnasal drip 10/11/2015   Allergic rhinitis, cause unspecified    APC (atrial premature contractions) 07/06/2015   Arthralgia 08/08/2023   Asthma    Asthma, moderate persistent 02/12/2010   PFT 01/2011- FEV1/FVC 0.72; mild to mod obstruction w/ response to bronchodilator  Offce Spirometry 08/08/2016-NL. FVC 2.04/77%, FEV1 1.64/81%, FEV1/FVC 0.80        Atelectasis of left lung 12/04/2020   Atypical chest pain 12/02/2020   Back abrasion 10/04/2023   Chronic airway obstruction, not elsewhere classified    Chronic hoarseness 06/04/2007   Annotation: probable  Qualifier: Diagnosis of   By: Gilman Buttner CMA, Katie         Coccyx contusion 10/04/2023   Contusion of left thumb 10/04/2023   Coronary atherosclerosis 08/13/2008   Qualifier: Diagnosis of   By: Maple Hudson MD, Clinton D     IMO SNOMED Dx Update Oct 2024     Coronary atherosclerosis of unspecified type of vessel, native or graft    COVID-19 09/2020   COVID-19 virus infection 10/16/2020   Cystocele with prolapse 06/15/2020   Depression 04/18/2020   Drug allergy, antibiotic 08/24/2017    Penicillin caused edema, Z-Pak may have caused rash     Dysfunction of right eustachian tube 09/07/2019   Encounter for Medicare annual wellness exam 01/23/2018   Essential hypertension 07/06/2015   Feeling of incomplete bladder emptying 07/10/2023   Fibromyalgia 05/28/2019   GERD (gastroesophageal reflux disease) 03/20/2017   Hyperlipidemia 07/06/2015   Impaired fasting glucose 05/26/2016   Formatting of this note might be different from the original.  Stable with diet changes     Loose stools 01/09/2018   Low back pain 05/28/2019   Lower respiratory infection (e.g., bronchitis, pneumonia, pneumonitis, pulmonitis) 08/03/2017   Mitral valve disease 06/04/2007   Qualifier: Diagnosis of   By: Gilman Buttner CMA, Katie      IMO SNOMED Dx Update Oct 2024     Mitral valve disorders(424.0)    Moderate persistent asthmatic bronchitis with exacerbation 10/19/2020   Mononeuritis 06/04/2007   Qualifier: Diagnosis of   By: Gilman Buttner CMA, Katie      IMO SNOMED Dx Update Oct 2024     Mononeuritis of unspecified site    Myalgia and myositis, unspecified    Non-intractable vomiting with nausea 01/09/2018   Non-rheumatic mitral regurgitation 07/06/2015   OAB (overactive bladder) 08/26/2022   Pain in right shoulder 11/20/2019   Palpitation 07/06/2015   Pelvic floor weakness 07/10/2023  Pelvic pain 07/10/2023   Peripheral vascular disease, unspecified (HCC)    Prediabetes 02/28/2020   Preoperative clearance 09/13/2022   Seasonal and perennial allergic rhinitis 06/04/2007   Qualifier: Diagnosis of   By: Gilman Buttner CMA, Katie         SI joint arthritis (HCC) 05/28/2019   Sleep disturbance, unspecified    Spondylolisthesis, lumbar region 05/28/2019   Stenosis of right carotid artery 07/06/2015   Overview:   MRA 07/10/14:  At the RIGHT carotid bifurcation, there is estimated 50% stenosis at the RIGHT internal carotid artery origin. Minimal non stenotic atheromatous change LEFT carotid bifurcation. No measurable  LEFT ICA disease  Overview:   Overview:   MRA 07/10/14:  At the RIGHT carotid bifurcation, there is estimated 50% stenosis at the RIGHT internal carotid artery origin. Minimal non s   Trochanteric bursitis, left hip 05/28/2019   Unspecified glaucoma 06/04/2007   Qualifier: Diagnosis of   By: Gilman Buttner CMA, Katie      IMO SNOMED Dx Update Oct 2024     Unspecified glaucoma(365.9)    Urge incontinence 08/26/2022   Uterovaginal prolapse, incomplete 10/21/2022   Vaginal atrophy 08/26/2022   Vertigo 08/27/2019    Allergies  Allergen Reactions   Iodinated Contrast Media Shortness Of Breath and Swelling    Swelling in facial/No issues with shellfish    Azithromycin Hives    No problem with Biaxin   Levofloxacin     Tendon tears   Misc. Sulfonamide Containing Compounds    Montelukast Other (See Comments)   Montelukast Sodium Swelling   Cefaclor Rash   Penicillins Swelling   Sulfa Antibiotics Rash   Sulfonamide Derivatives Rash    Objective:  ACELYNN DEJONGE is a pleasant 78 y.o. female in NAD. AAO x 3.  Vascular Examination: Patient has palpable DP pulse, absent PT pulse bilateral.  Delayed capillary refill bilateral toes.  Sparse digital hair bilateral.  Proximal to distal cooling WNL bilateral.    Dermatological Examination: Interspaces are clear with no open lesions noted bilateral.  Skin is shiny and atrophic bilateral.  Nails are 3-62mm thick, with yellowish/brown discoloration, subungual debris and distal onycholysis x10.  There is pain with compression of nails x10.  There are hyperkeratotic lesions noted bilateral plantar medial aspect of the first MPJ bilateral as well as left submet 2 .  Patient qualifies for at-risk foot care because of PVD.  Assessment/Plan: 1. Pain due to onychomycosis of toenails of both feet   2. Callus   3. PVD (peripheral vascular disease) (HCC)    Mycotic nails x10 were sharply debrided with sterile nail nippers and power debriding burr to decrease  bulk and length.  Hyperkeratotic lesions x 3 on plantar aspect of the ball of the foot were shaved with #312 blade.  Follow-up in 3 months or as needed   Clerance Lav, DPM, FACFAS [facility]     [address] [place], [place] [address]                Office [phone]  Fax [phone]

## 2023-11-15 ENCOUNTER — Encounter: Payer: Self-pay | Admitting: Internal Medicine

## 2023-11-17 DIAGNOSIS — H6121 Impacted cerumen, right ear: Secondary | ICD-10-CM | POA: Insufficient documentation

## 2023-12-02 NOTE — Progress Notes (Unsigned)
 Subjective:    Patient ID: Debra Byrd, female    DOB: 14-Apr-1946, 78 y.o.   MRN: [id number]  HPI Female never smoker followed for chronic obstructive asthma/Xolair /often steroid dependent, complicated by allergic rhinitis, peripheral neuropathy, glaucoma,  Offce Spirometry 08/08/2016-NL. FVC 2.04/77%, FEV1 1.64/81%, FEV1/FVC 0.80 HST 07/26/13- WNL  AHI 4.4/ hr, desaturation to 73%, body weight 175 lbs FeNO elevated =============================================================   09/05/23- 78 year old female never smoker followed for chronic obstructive asthma/Xolair / often steroid dependent, complicated by allergic rhinitis, peripheral neuropathy, Glaucoma, Covid infection Feb 2022, GERD,  -Wixela 250/ Advair, pro-air HFA, Dupixent , neb albuterol , benzonatate  Off Dupixent  after ?? Allergic Reaction with arthralgias after Dupixent  injection 07/13/23. Ongoing but shifting pain and stiffness especially in the hands (now improved) and legs.  Discussed the use of AI scribe software for clinical note transcription with the patient, who gave verbal consent to proceed. History of Present Illness   The patient, with a history of asthma and osteoporosis/ Prolia, presents with joint pain that started after receiving a Dupixent  injection in November. The patient reports intense thigh pain that woke her up in the middle of the night, fearing a fracture. The patient also experienced severe knee pain and swelling in the hands, which has since improved. The pain was so severe that the patient needed assistance to get out of bed and has been using a cane for mobility. The patient also reports body pain, describing it as deep.   The patient had been managing her asthma with Dupixent , but has stopped the medication due to the joint pain. The patient reports only noticing wheezing two days ago and has been careful to avoid triggers. The patient also has osteoporosis and has been on Prolia since July of the previous  year. The patient suspects that Prolia might be causing the joint and muscle pain. The patient is due for a bone density test later in the month. There has been concern that her muscle and joint pains were triggered by Dupixent  and/ or Prolia, although it has been several months since she got either med.  Her Sports Medicine doctor suggested she see a Rheumatologist and she asks me for that referral.   Assessment and Plan:    Joint Pain and Swelling Severe pain and swelling in the hands and legs, initially thought to be an allergic reaction to Dupixent . However, the pain persisted and new pains developed even after discontinuation of Dupixent . Recent blood work by Dr. Elfida Grinder showed normal inflammatory markers, except for slightly low sodium. -She requests we refer to Rheumatologist Dr. Bascom Lily for further evaluation and management.  Osteoporosis Currently managed with Prolia injections by Dr. Elfida Grinder. Patient has concerns about potential side effects of Prolia, including joint pain and risk of fractures. -Continue current management with Prolia as per Dr. Rosalin Colorado advice. -Scheduled for a bone density test on January 28th.  Asthma Stable with no recent flare-ups since discontinuation of Dupixent . Patient reported one episode of wheezing but has been managing well with regular asthma medications. -Continue current asthma medications. -Schedule follow-up in three months or sooner if symptoms worsen.   12/04/23-78 year old female never smoker followed for chronic obstructive asthma(Xolair  then Dupixent  Reaction?) often steroid dependent, complicated by allergic rhinitis, peripheral neuropathy, Glaucoma, Covid infection Feb 2022, GERD,  -Wixela 250/ Advair, pro-air HFA, neb albuterol , benzonatate  Off Dupixent  after ?? Allergic Reaction with arthralgias after Dupixent  injection 07/13/23. Ongoing but shifting pain and stiffness especially in the hands (now improved) and legs. -----Asthma some improved.   Did have  flu in February.  Using albuterol  1-2 times a week. PCC/ Rheum  did extensive labs, ruled out autoimmune. Discussed the use of AI scribe software for clinical note transcription with the patient, who gave verbal consent to proceed.  History of Present Illness   The patient, with a history of asthma and fibromyalgia, presents with wheezing and a stuffy nose. She reports that these symptoms have been manageable since discontinuing Dupixent .  However, she experienced a severe reaction  tentatively attributed to Dupixent  (or Prolia), including hand and leg swelling and myalgias/ arthralgias. , which required an ER visit.  Following this reaction, the patient was evaluated by her primary care physician and a rheumatologist. She was initially treated with prednisone  and later underwent blood work, which revealed an ANA titer of 1:160. The rheumatologist suspected lupus, but the patient's ANA is now returning to normal.  Despite this, the patient continues to experience significant pain, particularly in her legs. The rheumatologist believes this may be due to a flare-up of her fibromyalgia. The patient is currently taking gabapentin for sciatic pain and has been prescribed methocarbamol, although she is hesitant to take it due to concerns raised by an orthopedist. Rheumatology work-up ruled out collagen vascular disease.  The patient also reports a history of anaphylaxis following a CT scan with contrast dye in her thirties. She is currently managing her respiratory symptoms with Advair (or Wixela), Flonase , a rescue inhaler, and a nebulizer with albuterol .     Assessment and Plan    Asthma Wheezing without acute exacerbations. Symptoms well-controlled with Advair (or Wixela) and rescue inhaler. - Refill Advair or Wixela for asthma management. - Refill albuterol  nebulizer solution for rescue use.  Allergic rhinitis Stuffy and runny nose improved after discontinuing Dupixent . Symptoms align  with allergic rhinitis, likely exacerbated by seasonal factors. - Refill Flonase  for nasal symptoms. - Refill Claritin  for allergy  management.  Fibromyalgia Significant leg pain and sleep disturbances attributed to fibromyalgia flare-up. Managed by Rheumatology with gabapentin and methocarbamol, though hesitant to use methocarbamol due to age-related concerns. - Continue current management under rheumatologist's guidance. - Discuss methocarbamol use with rheumatologist.  Lupus Previously suspected lupus with ANA titer of 1:160. Current blood work shows normalizing ANA levels and symptom improvement. Under rheumatologist's monitoring. - Continue monitoring under rheumatologist's care.  Medication allergies Allergy  to azithromycin causing rash and history of anaphylaxis with contrast dye. Advised to avoid these substances. She  reports that she tolerates Biaxin  and asks script to hold. - Refill clarithromycin  (Biaxin ) as an alternative to azithromycin. - Advise to update medical alert bracelet to indicate contrast dye allergy .      ROS-see HPI-+ = positive Constitutional:   No-   weight loss, night sweats, fevers, chills, +fatigue, lassitude. HEENT:   No-   difficulty swallowing, tooth/dental problems, sore throat,       No-  sneezing, itching, ear ache,  nasal congestion, post nasal drip,  CV:  + Noncardiac chest pain, no- orthopnea, PND, swelling in lower extremities,                                           anasarca, dizziness, palpitations Resp:+shortness of breath with exertion or at rest.               productive cough,   non-productive cough,  No- coughing up of blood.  change in color of mucus.  +Little wheezing.   Skin: No-   rash or lesions. GI:  No-   heartburn, indigestion, abdominal pain, nausea, vomiting,  GU: pain. MS:  +joint pain or swelling.  +Diffuse somatic pains Neuro-     nothing unusualk pain. Psych:  No- change in mood or affect. No depression or  anxiety.  No memory loss.   Objective:   Physical Exam              General- Alert, Oriented, Affect-appropriate, Distress-none acute, + overweight,  Skin- rash-none, lesions- none, excoriation- none Lymphadenopathy- none Head- atraumatic            Eyes- Gross vision intact, PERRLA, conjunctivae clear secretions. Chronic periorbital edema.            Ears- Hearing, canals-normal            Nose- Clear, no-Septal dev, mucus, polyps, erosion, perforation             Throat- Mallampati II-III , mucosa clear , drainage- none, tonsils- atrophic,  Neck- flexible , trachea midline, no stridor , thyroid nl, carotid no bruit Chest - symmetrical excursion , unlabored           Heart/CV- RRR , no murmur , no gallop  , no rub, nl s1 s2                           - JVD- none , edema- none, stasis changes- none, varices- none           Lung- clear, unlabored, dullness-none, rub- none,  Cough- none           Chest wall-  Abd-  Br/ Gen/ Rectal- Not done, not indicated Extrem- cyanosis- none, clubbing, none, atrophy- none, strength- nl Neuro- grossly intact to observation

## 2023-12-04 ENCOUNTER — Ambulatory Visit: Payer: Medicare Other | Admitting: Internal Medicine

## 2023-12-04 ENCOUNTER — Encounter: Payer: Self-pay | Admitting: Internal Medicine

## 2023-12-04 DIAGNOSIS — J45909 Unspecified asthma, uncomplicated: Secondary | ICD-10-CM

## 2023-12-04 DIAGNOSIS — J4541 Moderate persistent asthma with (acute) exacerbation: Secondary | ICD-10-CM

## 2023-12-04 DIAGNOSIS — M797 Fibromyalgia: Secondary | ICD-10-CM

## 2023-12-04 MED ORDER — ALBUTEROL SULFATE HFA 108 (90 BASE) MCG/ACT IN AERS: 2.0000 | INHALATION_SPRAY | Freq: Four times a day (QID) | RESPIRATORY_TRACT | 6 refills | Status: AC | PRN

## 2023-12-04 MED ORDER — PREDNISONE 10 MG PO TABS: ORAL_TABLET | ORAL | 0 refills | Status: DC

## 2023-12-04 MED ORDER — CLARITHROMYCIN 500 MG PO TABS: 500.0000 mg | ORAL_TABLET | Freq: Two times a day (BID) | ORAL | 1 refills | Status: DC

## 2023-12-04 MED ORDER — FLUTICASONE PROPIONATE 50 MCG/ACT NA SUSP: 2.0000 | Freq: Every day | NASAL | 5 refills | Status: DC

## 2023-12-04 MED ORDER — FLUTICASONE PROPIONATE 50 MCG/ACT NA SUSP: NASAL | 4 refills | Status: AC

## 2023-12-04 MED ORDER — LORATADINE 10 MG PO TABS: 10.0000 mg | ORAL_TABLET | Freq: Every day | ORAL | 4 refills | Status: AC

## 2023-12-04 MED ORDER — FLUTICASONE-SALMETEROL 250-50 MCG/ACT IN AEPB: 1.0000 | INHALATION_SPRAY | Freq: Two times a day (BID) | RESPIRATORY_TRACT | 4 refills | Status: AC

## 2023-12-04 MED ORDER — ALBUTEROL SULFATE (2.5 MG/3ML) 0.083% IN NEBU: 2.5000 mg | INHALATION_SOLUTION | Freq: Four times a day (QID) | RESPIRATORY_TRACT | 12 refills | Status: AC | PRN

## 2023-12-04 NOTE — Patient Instructions (Signed)
 Needs pended scripts sent to [facility], [address] [place]( or [place]?), [place] [address],   [phone]

## 2023-12-11 ENCOUNTER — Encounter: Payer: Self-pay | Admitting: Internal Medicine

## 2023-12-17 ENCOUNTER — Encounter: Payer: Self-pay | Admitting: Internal Medicine

## 2024-01-03 ENCOUNTER — Ambulatory Visit (INDEPENDENT_AMBULATORY_CARE_PROVIDER_SITE_OTHER): Payer: Medicare Other | Admitting: Podiatry

## 2024-01-03 DIAGNOSIS — L84 Corns and callosities: Secondary | ICD-10-CM | POA: Diagnosis not present

## 2024-01-03 DIAGNOSIS — M79674 Pain in right toe(s): Secondary | ICD-10-CM

## 2024-01-03 DIAGNOSIS — B351 Tinea unguium: Secondary | ICD-10-CM

## 2024-01-03 DIAGNOSIS — I739 Peripheral vascular disease, unspecified: Secondary | ICD-10-CM

## 2024-01-03 DIAGNOSIS — M79675 Pain in left toe(s): Secondary | ICD-10-CM

## 2024-01-03 NOTE — Progress Notes (Signed)
 Subjective:  Patient ID: Debra Byrd, female    DOB: Jan 20, 1946,  MRN: [id number]  Debra Byrd presents to clinic today for:  Chief Complaint  Patient presents with   [facility]    RFC with out callous. Not diabetic, takes Plavix .    Patient notes nails are thick and elongated, causing pain in shoe gear when ambulating.  She has painful calluses at the great toe joint bilateral .  She also noted that she is requesting her nails be cut straight across.   PCP is Nodal, Ulysees Gander, PA-C.  Last seen 11/10/2023  Past Medical History:  Diagnosis Date   Acute left flank pain 04/09/2020   ACUTE MUCOID OTITIS MEDIA 08/08/2007   Qualifier: Diagnosis of   By: Linder Revere MD, Clinton D        Allergic rhinitis with postnasal drip 10/11/2015   Allergic rhinitis, cause unspecified    APC (atrial premature contractions) 07/06/2015   Arthralgia 08/08/2023   Asthma    Asthma, moderate persistent 02/12/2010   PFT 01/2011- FEV1/FVC 0.72; mild to mod obstruction w/ response to bronchodilator  Offce Spirometry 08/08/2016-NL. FVC 2.04/77%, FEV1 1.64/81%, FEV1/FVC 0.80        Atelectasis of left lung 12/04/2020   Atypical chest pain 12/02/2020   Back abrasion 10/04/2023   Chronic airway obstruction, not elsewhere classified    Chronic hoarseness 06/04/2007   Annotation: probable  Qualifier: Diagnosis of   By: Rosanna Comment CMA, Katie         Coccyx contusion 10/04/2023   Contusion of left thumb 10/04/2023   Coronary atherosclerosis 08/13/2008   Qualifier: Diagnosis of   By: Linder Revere MD, Clinton D     IMO SNOMED Dx Update Oct 2024     Coronary atherosclerosis of unspecified type of vessel, native or graft    COVID-19 09/2020   COVID-19 virus infection 10/16/2020   Cystocele with prolapse 06/15/2020   Depression 04/18/2020   Drug allergy , antibiotic 08/24/2017   Penicillin caused edema, Z-Pak may have caused rash     Dysfunction of right eustachian tube 09/07/2019   Encounter for Medicare annual wellness exam  01/23/2018   Essential hypertension 07/06/2015   Feeling of incomplete bladder emptying 07/10/2023   Fibromyalgia 05/28/2019   GERD (gastroesophageal reflux disease) 03/20/2017   Hyperlipidemia 07/06/2015   Impaired fasting glucose 05/26/2016   Formatting of this note might be different from the original.  Stable with diet changes     Loose stools 01/09/2018   Low back pain 05/28/2019   Lower respiratory infection (e.g., bronchitis, pneumonia, pneumonitis, pulmonitis) 08/03/2017   Mitral valve disease 06/04/2007   Qualifier: Diagnosis of   By: Rosanna Comment CMA, Katie      IMO SNOMED Dx Update Oct 2024     Mitral valve disorders(424.0)    Moderate persistent asthmatic bronchitis with exacerbation 10/19/2020   Mononeuritis 06/04/2007   Qualifier: Diagnosis of   By: Rosanna Comment CMA, Katie      IMO SNOMED Dx Update Oct 2024     Mononeuritis of unspecified site    Myalgia and myositis, unspecified    Non-intractable vomiting with nausea 01/09/2018   Non-rheumatic mitral regurgitation 07/06/2015   OAB (overactive bladder) 08/26/2022   Pain in right shoulder 11/20/2019   Palpitation 07/06/2015   Pelvic floor weakness 07/10/2023   Pelvic pain 07/10/2023   Peripheral vascular disease, unspecified (HCC)    Prediabetes 02/28/2020   Preoperative clearance 09/13/2022   Seasonal and perennial allergic rhinitis 06/04/2007  Qualifier: Diagnosis of   By: Rosanna Comment CMA, Katie         SI joint arthritis (HCC) 05/28/2019   Sleep disturbance, unspecified    Spondylolisthesis, lumbar region 05/28/2019   Stenosis of right carotid artery 07/06/2015   Overview:   MRA 07/10/14:  At the RIGHT carotid bifurcation, there is estimated 50% stenosis at the RIGHT internal carotid artery origin. Minimal non stenotic atheromatous change LEFT carotid bifurcation. No measurable LEFT ICA disease  Overview:   Overview:   MRA 07/10/14:  At the RIGHT carotid bifurcation, there is estimated 50% stenosis at the RIGHT internal carotid  artery origin. Minimal non s   Trochanteric bursitis, left hip 05/28/2019   Unspecified glaucoma 06/04/2007   Qualifier: Diagnosis of   By: Rosanna Comment CMA, Katie      IMO SNOMED Dx Update Oct 2024     Unspecified glaucoma(365.9)    Urge incontinence 08/26/2022   Uterovaginal prolapse, incomplete 10/21/2022   Vaginal atrophy 08/26/2022   Vertigo 08/27/2019    Allergies  Allergen Reactions   Iodinated Contrast Media Shortness Of Breath and Swelling    Swelling in facial/No issues with shellfish    Azithromycin Hives    No problem with Biaxin    Levofloxacin     Tendon tears   Misc. Sulfonamide Containing Compounds    Montelukast Other (See Comments)   Montelukast Sodium Swelling   Cefaclor Rash   Penicillins Swelling   Sulfa Antibiotics Rash   Sulfonamide Derivatives Rash    Objective:  Debra Byrd is a pleasant 78 y.o. female in NAD. AAO x 3.  Vascular Examination: Patient has palpable DP pulse, absent PT pulse bilateral.  Delayed capillary refill bilateral toes.  Sparse digital hair bilateral.  Proximal to distal cooling WNL bilateral.    Dermatological Examination: Interspaces are clear with no open lesions noted bilateral.  Skin is shiny and atrophic bilateral.  Nails are 3-23mm thick, with yellowish/brown discoloration, subungual debris and distal onycholysis x10.  There is pain with compression of nails x10.  There are hyperkeratotic lesions noted bilateral plantar medial aspect of the first MPJ bilateral.  Patient qualifies for at-risk foot care because of PVD.  Assessment/Plan: 1. Pain due to onychomycosis of toenails of both feet   2. Callus   3. PVD (peripheral vascular disease) (HCC)    Mycotic nails x10 were sharply debrided with sterile nail nippers and power debriding burr to decrease bulk and length.  These were cut straight at her request  Hyperkeratotic lesions x 2 on plantar aspect of the ball of the foot were shaved with #312 blade.  Follow-up in 3  months or as needed   Joe Murders, DPM, FACFAS [facility]     [address] [place], [place] [address]                Office [phone]  Fax [phone]

## 2024-01-12 ENCOUNTER — Other Ambulatory Visit: Payer: Self-pay | Admitting: Cardiology

## 2024-01-30 ENCOUNTER — Encounter: Payer: Medicare Other | Admitting: Rheumatology

## 2024-02-22 NOTE — Telephone Encounter (Signed)
Pt aware.

## 2024-02-26 ENCOUNTER — Encounter: Payer: Self-pay | Admitting: Internal Medicine

## 2024-02-26 ENCOUNTER — Ambulatory Visit (INDEPENDENT_AMBULATORY_CARE_PROVIDER_SITE_OTHER): Admitting: Physician Assistant

## 2024-02-26 ENCOUNTER — Encounter: Payer: Self-pay | Admitting: Physician Assistant

## 2024-02-26 DIAGNOSIS — G8929 Other chronic pain: Secondary | ICD-10-CM

## 2024-02-26 DIAGNOSIS — M25511 Pain in right shoulder: Secondary | ICD-10-CM

## 2024-02-26 NOTE — Progress Notes (Signed)
 Office Visit Note   Patient: Debra Byrd           Date of Birth: 02-12-1946           MRN: [id number] Visit Date: 02/26/2024              Requested by: Hunter Mickey Browner, PA-C [address] [place],  [place] [address] PCP: Hunter Mickey Browner, PA-C   Assessment & Plan: Visit Diagnoses:  1. Chronic right shoulder pain     Plan: Patient is a pleasant 78 year old woman who is a patient of Dr. Anderson.  She had rotator cuff repair on her right shoulder in 2001.  She is now seeing an orthopedist for problems she is having in her back and has an MRI scheduled.  She is concerned because she thought she was told that she had mental in her shoulder was wondering if that would be a concern for the MRI.  I told her certainly would not that is just a metal clip that was used in the repair.  Should not pose a problem.  She asked about who to follow-up with physician wise here if she has problems with her knee her shoulder I told her she could follow-up with Dr. Addie certainly.  She also talks about having some cystic nodules in her neck.  I could not palpate these today but probably some nodes that she should have reviewed with her primary care she understands  Follow-Up Instructions: Return if symptoms worsen or fail to improve.   Orders:  No orders of the defined types were placed in this encounter.  No orders of the defined types were placed in this encounter.     Procedures: No procedures performed   Clinical Data: No additional findings.   Subjective: No chief complaint on file.   HPI pleasant 78 year old woman comes in today asking for information regarding her rotator cuff surgery with Dr. Anderson in 2001.  She is about to undergo an MRI of her back wondering if she thought I had mentioned at her last visit that she had metal clips which were evident on x-ray was concerned they might interfere with MRIs  Review of Systems  All other systems reviewed and are  negative.    Objective: Vital Signs: There were no vitals taken for this visit.  Physical Exam Constitutional:      Appearance: Normal appearance.  Pulmonary:     Effort: Pulmonary effort is normal.  Skin:    General: Skin is warm and dry.  Neurological:     General: No focal deficit present.     Mental Status: She is alert and oriented to person, place, and time.  Psychiatric:        Mood and Affect: Mood normal.        Behavior: Behavior normal.     Ortho Exam Right shoulder she has full range of motion strength is intact she is neurovascularly intact.  Could not appreciate any palpable nodes today. Specialty Comments:  No specialty comments available.  Imaging: No results found.   PMFS History: Patient Active Problem List   Diagnosis Date Noted   Back abrasion 10/04/2023   Coccyx contusion 10/04/2023   Contusion of left thumb 10/04/2023   Arthralgia 08/08/2023   Feeling of incomplete bladder emptying 07/10/2023   Pelvic floor weakness 07/10/2023   Pelvic pain 07/10/2023   Uterovaginal prolapse, incomplete 10/21/2022   Preoperative clearance 09/13/2022   OAB (overactive bladder) 08/26/2022  Urge incontinence 08/26/2022   Vaginal atrophy 08/26/2022   Atelectasis of left lung 12/04/2020   Atypical chest pain 12/02/2020   Moderate persistent asthmatic bronchitis with exacerbation 10/19/2020   COVID-19 virus infection 10/16/2020   COVID-19 09/2020   Cystocele with prolapse 06/15/2020   Depression 04/18/2020   Acute left flank pain 04/09/2020   Prediabetes 02/28/2020   Pain in right shoulder 11/20/2019   Dysfunction of right eustachian tube 09/07/2019   Vertigo 08/27/2019   Trochanteric bursitis, left hip 05/28/2019   Low back pain 05/28/2019   Fibromyalgia 05/28/2019   Spondylolisthesis, lumbar region 05/28/2019   SI joint arthritis (HCC) 05/28/2019   Encounter for Medicare annual wellness exam 01/23/2018   Loose stools 01/09/2018   Non-intractable  vomiting with nausea 01/09/2018   Drug allergy , antibiotic 08/24/2017   Lower respiratory infection (e.g., bronchitis, pneumonia, pneumonitis, pulmonitis) 08/03/2017   GERD (gastroesophageal reflux disease) 03/20/2017   Impaired fasting glucose 05/26/2016   Allergic rhinitis with postnasal drip 10/11/2015   APC (atrial premature contractions) 07/06/2015   Essential hypertension 07/06/2015   Hyperlipidemia 07/06/2015   Non-rheumatic mitral regurgitation 07/06/2015   Palpitation 07/06/2015   Stenosis of right carotid artery 07/06/2015   Asthma, moderate persistent 02/12/2010   Coronary atherosclerosis 08/13/2008   PERIPHERAL VASCULAR DISEASE 08/13/2008   Coronary atherosclerosis 08/13/2008   ACUTE MUCOID OTITIS MEDIA 08/08/2007   Mononeuritis 06/04/2007   Unspecified glaucoma 06/04/2007   Mitral valve disease 06/04/2007   Seasonal and perennial allergic rhinitis 06/04/2007   Chronic hoarseness 06/04/2007   FIBROMYALGIA 06/04/2007   Mitral valve disease 06/04/2007   Mononeuritis 06/04/2007   Past Medical History:  Diagnosis Date   Acute left flank pain 04/09/2020   ACUTE MUCOID OTITIS MEDIA 08/08/2007   Qualifier: Diagnosis of   By: Neysa MD, Clinton D        Allergic rhinitis with postnasal drip 10/11/2015   Allergic rhinitis, cause unspecified    APC (atrial premature contractions) 07/06/2015   Arthralgia 08/08/2023   Asthma    Asthma, moderate persistent 02/12/2010   PFT 01/2011- FEV1/FVC 0.72; mild to mod obstruction w/ response to bronchodilator  Offce Spirometry 08/08/2016-NL. FVC 2.04/77%, FEV1 1.64/81%, FEV1/FVC 0.80        Atelectasis of left lung 12/04/2020   Atypical chest pain 12/02/2020   Back abrasion 10/04/2023   Chronic airway obstruction, not elsewhere classified    Chronic hoarseness 06/04/2007   Annotation: probable  Qualifier: Diagnosis of   By: Lauralee CMA, Katie         Coccyx contusion 10/04/2023   Contusion of left thumb 10/04/2023   Coronary  atherosclerosis 08/13/2008   Qualifier: Diagnosis of   By: Neysa MD, Clinton D     IMO SNOMED Dx Update Oct 2024     Coronary atherosclerosis of unspecified type of vessel, native or graft    COVID-19 09/2020   COVID-19 virus infection 10/16/2020   Cystocele with prolapse 06/15/2020   Depression 04/18/2020   Drug allergy , antibiotic 08/24/2017   Penicillin caused edema, Z-Pak may have caused rash     Dysfunction of right eustachian tube 09/07/2019   Encounter for Medicare annual wellness exam 01/23/2018   Essential hypertension 07/06/2015   Feeling of incomplete bladder emptying 07/10/2023   Fibromyalgia 05/28/2019   GERD (gastroesophageal reflux disease) 03/20/2017   Hyperlipidemia 07/06/2015   Impaired fasting glucose 05/26/2016   Formatting of this note might be different from the original.  Stable with diet changes     Loose stools 01/09/2018  Low back pain 05/28/2019   Lower respiratory infection (e.g., bronchitis, pneumonia, pneumonitis, pulmonitis) 08/03/2017   Mitral valve disease 06/04/2007   Qualifier: Diagnosis of   By: Lauralee CMA, Katie      IMO SNOMED Dx Update Oct 2024     Mitral valve disorders(424.0)    Moderate persistent asthmatic bronchitis with exacerbation 10/19/2020   Mononeuritis 06/04/2007   Qualifier: Diagnosis of   By: Lauralee CMA, Katie      IMO SNOMED Dx Update Oct 2024     Mononeuritis of unspecified site    Myalgia and myositis, unspecified    Non-intractable vomiting with nausea 01/09/2018   Non-rheumatic mitral regurgitation 07/06/2015   OAB (overactive bladder) 08/26/2022   Pain in right shoulder 11/20/2019   Palpitation 07/06/2015   Pelvic floor weakness 07/10/2023   Pelvic pain 07/10/2023   Peripheral vascular disease, unspecified (HCC)    Prediabetes 02/28/2020   Preoperative clearance 09/13/2022   Seasonal and perennial allergic rhinitis 06/04/2007   Qualifier: Diagnosis of   By: Lauralee CMA, Katie         SI joint arthritis (HCC)  05/28/2019   Sleep disturbance, unspecified    Spondylolisthesis, lumbar region 05/28/2019   Stenosis of right carotid artery 07/06/2015   Overview:   MRA 07/10/14:  At the RIGHT carotid bifurcation, there is estimated 50% stenosis at the RIGHT internal carotid artery origin. Minimal non stenotic atheromatous change LEFT carotid bifurcation. No measurable LEFT ICA disease  Overview:   Overview:   MRA 07/10/14:  At the RIGHT carotid bifurcation, there is estimated 50% stenosis at the RIGHT internal carotid artery origin. Minimal non s   Trochanteric bursitis, left hip 05/28/2019   Unspecified glaucoma 06/04/2007   Qualifier: Diagnosis of   By: Lauralee CMA, Katie      IMO SNOMED Dx Update Oct 2024     Unspecified glaucoma(365.9)    Urge incontinence 08/26/2022   Uterovaginal prolapse, incomplete 10/21/2022   Vaginal atrophy 08/26/2022   Vertigo 08/27/2019    Family History  Problem Relation Age of Onset   Heart disease Father    Emphysema Maternal Uncle     Past Surgical History:  Procedure Laterality Date   ROTATOR CUFF REPAIR     Social History   Occupational History   Not on file  Tobacco Use   Smoking status: Never    Passive exposure: Never   Smokeless tobacco: Never  Vaping Use   Vaping status: Never Used  Substance and Sexual Activity   Alcohol use: No   Drug use: No   Sexual activity: Not on file

## 2024-02-28 ENCOUNTER — Ambulatory Visit: Payer: Medicare Other | Admitting: Rheumatology

## 2024-03-02 NOTE — Progress Notes (Signed)
 Subjective:    Patient ID: Debra Byrd, female    DOB: 07/06/1946, 78 y.o.   MRN: [id number]  HPI Female never smoker followed for chronic obstructive asthma/Xolair /often steroid dependent, complicated by allergic rhinitis, peripheral neuropathy, glaucoma,  Offce Spirometry 08/08/2016-NL. FVC 2.04/77%, FEV1 1.64/81%, FEV1/FVC 0.80 HST 07/26/13- WNL  AHI 4.4/ hr, desaturation to 73%, body weight 175 lbs FeNO elevated =============================================================   12/04/23-78 year old female never smoker followed for chronic obstructive asthma(Xolair  then Dupixent  Reaction?) often steroid dependent, complicated by allergic rhinitis, peripheral neuropathy, Glaucoma, Covid infection Feb 2022, GERD,  -Wixela 250/ Advair, pro-air HFA, neb albuterol , benzonatate  Off Dupixent  after ?? Allergic Reaction with arthralgias after Dupixent  injection 07/13/23. Ongoing but shifting pain and stiffness especially in the hands (now improved) and legs. -----Asthma some improved.  Did have flu in February.  Using albuterol  1-2 times a week. PCC/ Rheum  did extensive labs, ruled out autoimmune. Discussed the use of AI scribe software for clinical note transcription with the patient, who gave verbal consent to proceed.  History of Present Illness   The patient, with a history of asthma and fibromyalgia, presents with wheezing and a stuffy nose. She reports that these symptoms have been manageable since discontinuing Dupixent .  However, she experienced a severe reaction  tentatively attributed to Dupixent  (or Prolia), including hand and leg swelling and myalgias/ arthralgias. , which required an ER visit.  Following this reaction, the patient was evaluated by her primary care physician and a rheumatologist. She was initially treated with prednisone  and later underwent blood work, which revealed an ANA titer of 1:160. The rheumatologist suspected lupus, but the patient's ANA is now returning to  normal.  Despite this, the patient continues to experience significant pain, particularly in her legs. The rheumatologist believes this may be due to a flare-up of her fibromyalgia. The patient is currently taking gabapentin for sciatic pain and has been prescribed methocarbamol, although she is hesitant to take it due to concerns raised by an orthopedist. Rheumatology work-up ruled out collagen vascular disease.  The patient also reports a history of anaphylaxis following a CT scan with contrast dye in her thirties. She is currently managing her respiratory symptoms with Advair (or Wixela), Flonase , a rescue inhaler, and a nebulizer with albuterol .     Assessment and Plan:    Asthma Wheezing without acute exacerbations. Symptoms well-controlled with Advair (or Wixela) and rescue inhaler. - Refill Advair or Wixela for asthma management. - Refill albuterol  nebulizer solution for rescue use.  Allergic rhinitis Stuffy and runny nose improved after discontinuing Dupixent . Symptoms align with allergic rhinitis, likely exacerbated by seasonal factors. - Refill Flonase  for nasal symptoms. - Refill Claritin  for allergy  management.  Fibromyalgia Significant leg pain and sleep disturbances attributed to fibromyalgia flare-up. Managed by Rheumatology with gabapentin and methocarbamol, though hesitant to use methocarbamol due to age-related concerns. - Continue current management under rheumatologist's guidance. - Discuss methocarbamol use with rheumatologist.  Lupus Previously suspected lupus with ANA titer of 1:160. Current blood work shows normalizing ANA levels and symptom improvement. Under rheumatologist's monitoring. - Continue monitoring under rheumatologist's care.  Medication allergies Allergy  to azithromycin causing rash and history of anaphylaxis with contrast dye. Advised to avoid these substances. She  reports that she tolerates Biaxin  and asks script to hold. - Refill clarithromycin   (Biaxin ) as an alternative to azithromycin. - Advise to update medical alert bracelet to indicate contrast dye allergy .   03/05/24-   78 year old female never smoker followed for chronic obstructive asthma(Xolair , then Dupixent  >  Reaction?) often steroid dependent, complicated by allergic rhinitis, peripheral neuropathy, Glaucoma, Covid infection Feb 2022, GERD,  -Wixela 250/ Advair, pro-air HFA, neb albuterol , benzonatate , Flonase , Off Dupixent  after ?? Allergic Reaction with arthralgias after Dupixent  injection 07/13/23. Ongoing but shifting pain and stiffness especially in the hands (now improved) and legs. Rheumatologist calls it flared fibromyalgia. ------Doing well at this time Still not needing rescue inhaler after being off Dupixent  almost a year. Does note more rhinitis/ postnasal drip. Discussed the use of AI scribe software for clinical note transcription with the patient, who gave verbal consent to proceed.  History of Present Illness   Debra Byrd is a 78 year old female with chronic obstructive asthma who presents for follow-up of her asthma management.  She previously managed her chronic obstructive asthma with steroid dependency and Xolair  injections before switching to Dupixent , which she discontinued six to seven months ago due to a possible allergic reaction with marked arthralgias. Her Rheumatologist says she had a flare of fibromyalgia.  Clear relation to Dupixent  not established, but so far only rhinitis is worse off Dupixent . Currently, she experiences minimal wheezing and no asthma attacks, rarely needing her rescue inhaler. Her medications include Advair and Flonase  nasal spray.  She experienced rhinitis symptoms, including nasal dripping and stuffiness, starting in January, which improved after April. These symptoms were more pronounced after stopping Dupixent . She has no headaches or concerns about a sinus infection.     Assessment and Plan:    Chronic obstructive  asthma- moderate persistent uncomplicated Previously steroid-dependent, now well-controlled. No recent exacerbations. Off Dupixent  for 6-7 months. Minimal rescue inhaler and nebulizer use. - Use rescue inhaler as needed.  Allergic rhinitis Symptoms improved post-Dupixent  discontinuation. No sinus infection. Using Flonase . - Continue Flonase  nasal spray.     ROS-see HPI-+ = positive Constitutional:   No-   weight loss, night sweats, fevers, chills, +fatigue, lassitude. HEENT:   No-   difficulty swallowing, tooth/dental problems, sore throat,       No-  sneezing, itching, ear ache,  nasal congestion, post nasal drip,  CV:  + Noncardiac chest pain, no- orthopnea, PND, swelling in lower extremities,                                           anasarca, dizziness, palpitations Resp:+shortness of breath with exertion or at rest.               productive cough,   non-productive cough,  No- coughing up of blood.                change in color of mucus.  +Little wheezing.   Skin: No-   rash or lesions. GI:  No-   heartburn, indigestion, abdominal pain, nausea, vomiting,  GU: pain. MS:  +joint pain or swelling.  +Diffuse somatic pains Neuro-     nothing unusualk pain. Psych:  No- change in mood or affect. No depression or anxiety.  No memory loss.   Objective:   Physical Exam              General- Alert, Oriented, Affect-appropriate, Distress-none acute, + overweight,  Skin- rash-none, lesions- none, excoriation- none Lymphadenopathy- none Head- atraumatic            Eyes- Gross vision intact, PERRLA, conjunctivae clear secretions. Chronic periorbital edema.  Ears- Hearing, canals-normal            Nose- Clear, no-Septal dev, mucus, polyps, erosion, perforation             Throat- Mallampati II-III , mucosa clear , drainage- none, tonsils- atrophic,  Neck- flexible , trachea midline, no stridor , thyroid nl, carotid no bruit Chest - symmetrical excursion , unlabored            Heart/CV- RRR , no murmur , no gallop  , no rub, nl s1 s2                           - JVD- none , edema- none, stasis changes- none, varices- none           Lung- clear, unlabored, dullness-none, rub- none,  Cough- none           Chest wall-  Abd-  Br/ Gen/ Rectal- Not done, not indicated Extrem- cyanosis- none, clubbing, none, atrophy- none, strength- nl Neuro- grossly intact to observation

## 2024-03-05 ENCOUNTER — Ambulatory Visit: Payer: Medicare Other | Admitting: Internal Medicine

## 2024-03-05 ENCOUNTER — Encounter: Payer: Self-pay | Admitting: Internal Medicine

## 2024-03-05 VITALS — BP 109/60 | HR 56 | Temp 98.0°F | Resp 18 | Ht 61.0 in | Wt 151.2 lb

## 2024-03-05 DIAGNOSIS — J4540 Moderate persistent asthma, uncomplicated: Secondary | ICD-10-CM | POA: Diagnosis not present

## 2024-03-05 NOTE — Patient Instructions (Signed)
 Glad you are doing well now. Please let us  know if we can help you.

## 2024-03-06 DIAGNOSIS — M48062 Spinal stenosis, lumbar region with neurogenic claudication: Secondary | ICD-10-CM | POA: Insufficient documentation

## 2024-03-22 ENCOUNTER — Encounter: Payer: Self-pay | Admitting: Internal Medicine

## 2024-04-02 DIAGNOSIS — Z8673 Personal history of transient ischemic attack (TIA), and cerebral infarction without residual deficits: Secondary | ICD-10-CM | POA: Insufficient documentation

## 2024-04-02 DIAGNOSIS — G459 Transient cerebral ischemic attack, unspecified: Secondary | ICD-10-CM

## 2024-04-02 DIAGNOSIS — I361 Nonrheumatic tricuspid (valve) insufficiency: Secondary | ICD-10-CM | POA: Diagnosis not present

## 2024-04-02 DIAGNOSIS — I340 Nonrheumatic mitral (valve) insufficiency: Secondary | ICD-10-CM | POA: Diagnosis not present

## 2024-04-02 HISTORY — DX: Transient cerebral ischemic attack, unspecified: G45.9

## 2024-04-03 ENCOUNTER — Ambulatory Visit: Admitting: Podiatry

## 2024-04-09 ENCOUNTER — Telehealth: Payer: Self-pay | Admitting: Cardiology

## 2024-04-09 NOTE — Telephone Encounter (Signed)
 Patient called to report that she had a hospital stay 8/11-12 and was diagnosed with a TIA.  Patient also noted she has had further testing - CT Scan, MRI and Ultrasound since scheduling her upcoming visit .

## 2024-04-12 ENCOUNTER — Ambulatory Visit (INDEPENDENT_AMBULATORY_CARE_PROVIDER_SITE_OTHER): Admitting: Podiatry

## 2024-04-12 DIAGNOSIS — M79675 Pain in left toe(s): Secondary | ICD-10-CM

## 2024-04-12 DIAGNOSIS — M79674 Pain in right toe(s): Secondary | ICD-10-CM | POA: Diagnosis not present

## 2024-04-12 DIAGNOSIS — L84 Corns and callosities: Secondary | ICD-10-CM

## 2024-04-12 DIAGNOSIS — B351 Tinea unguium: Secondary | ICD-10-CM

## 2024-04-12 DIAGNOSIS — Z8673 Personal history of transient ischemic attack (TIA), and cerebral infarction without residual deficits: Secondary | ICD-10-CM

## 2024-04-12 DIAGNOSIS — I739 Peripheral vascular disease, unspecified: Secondary | ICD-10-CM

## 2024-04-12 NOTE — Progress Notes (Signed)
 Subjective:  Patient ID: Debra Byrd, female    DOB: September 03, 1945,  MRN: [id number]  Debra Byrd presents to clinic today for:  Chief Complaint  Patient presents with   [facility]    RFC with callous Not diabetic ASA and Plavix    Patient notes nails are thick and elongated, causing pain in shoe gear when ambulating.  She has painful calluses bilateral submet 1.  States that she was diagnosed with a TIA last week and was treated at [facility].  She is on anticoagulant therapy.  PCP is Nodal, Mickey Browner, PA-C.  Last seen on 04/09/2024  Past Medical History:  Diagnosis Date   Acute left flank pain 04/09/2020   ACUTE MUCOID OTITIS MEDIA 08/08/2007   Qualifier: Diagnosis of   By: Neysa MD, Clinton D        Allergic rhinitis with postnasal drip 10/11/2015   Allergic rhinitis, cause unspecified    APC (atrial premature contractions) 07/06/2015   Arthralgia 08/08/2023   Asthma    Asthma, moderate persistent 02/12/2010   PFT 01/2011- FEV1/FVC 0.72; mild to mod obstruction w/ response to bronchodilator  Offce Spirometry 08/08/2016-NL. FVC 2.04/77%, FEV1 1.64/81%, FEV1/FVC 0.80        Atelectasis of left lung 12/04/2020   Atypical chest pain 12/02/2020   Back abrasion 10/04/2023   Chronic airway obstruction, not elsewhere classified    Chronic hoarseness 06/04/2007   Annotation: probable  Qualifier: Diagnosis of   By: Lauralee CMA, Katie         Coccyx contusion 10/04/2023   Contusion of left thumb 10/04/2023   Coronary atherosclerosis 08/13/2008   Qualifier: Diagnosis of   By: Neysa MD, Clinton D     IMO SNOMED Dx Update Oct 2024     Coronary atherosclerosis of unspecified type of vessel, native or graft    COVID-19 09/2020   COVID-19 virus infection 10/16/2020   Cystocele with prolapse 06/15/2020   Depression 04/18/2020   Drug allergy , antibiotic 08/24/2017   Penicillin caused edema, Z-Pak may have caused rash     Dysfunction of right eustachian tube 09/07/2019   Encounter  for Medicare annual wellness exam 01/23/2018   Essential hypertension 07/06/2015   Feeling of incomplete bladder emptying 07/10/2023   Fibromyalgia 05/28/2019   GERD (gastroesophageal reflux disease) 03/20/2017   Hyperlipidemia 07/06/2015   Impaired fasting glucose 05/26/2016   Formatting of this note might be different from the original.  Stable with diet changes     Loose stools 01/09/2018   Low back pain 05/28/2019   Lower respiratory infection (e.g., bronchitis, pneumonia, pneumonitis, pulmonitis) 08/03/2017   Mitral valve disease 06/04/2007   Qualifier: Diagnosis of   By: Lauralee CMA, Katie      IMO SNOMED Dx Update Oct 2024     Mitral valve disorders(424.0)    Moderate persistent asthmatic bronchitis with exacerbation 10/19/2020   Mononeuritis 06/04/2007   Qualifier: Diagnosis of   By: Lauralee CMA, Katie      IMO SNOMED Dx Update Oct 2024     Mononeuritis of unspecified site    Myalgia and myositis, unspecified    Non-intractable vomiting with nausea 01/09/2018   Non-rheumatic mitral regurgitation 07/06/2015   OAB (overactive bladder) 08/26/2022   Pain in right shoulder 11/20/2019   Palpitation 07/06/2015   Pelvic floor weakness 07/10/2023   Pelvic pain 07/10/2023   Peripheral vascular disease, unspecified (HCC)    Prediabetes 02/28/2020   Preoperative clearance 09/13/2022   Seasonal and perennial  allergic rhinitis 06/04/2007   Qualifier: Diagnosis of   By: Lauralee CMA, Katie         SI joint arthritis (HCC) 05/28/2019   Sleep disturbance, unspecified    Spondylolisthesis, lumbar region 05/28/2019   Stenosis of right carotid artery 07/06/2015   Overview:   MRA 07/10/14:  At the RIGHT carotid bifurcation, there is estimated 50% stenosis at the RIGHT internal carotid artery origin. Minimal non stenotic atheromatous change LEFT carotid bifurcation. No measurable LEFT ICA disease  Overview:   Overview:   MRA 07/10/14:  At the RIGHT carotid bifurcation, there is estimated 50%  stenosis at the RIGHT internal carotid artery origin. Minimal non s   TIA (transient ischemic attack) 04/02/2024   Evaluated and [facility] at [facility]   Trochanteric bursitis, left hip 05/28/2019   Unspecified glaucoma 06/04/2007   Qualifier: Diagnosis of   By: Lauralee CMA, Katie      IMO SNOMED Dx Update Oct 2024     Unspecified glaucoma(365.9)    Urge incontinence 08/26/2022   Uterovaginal prolapse, incomplete 10/21/2022   Vaginal atrophy 08/26/2022   Vertigo 08/27/2019   Allergies  Allergen Reactions   Iodinated Contrast Media Shortness Of Breath and Swelling    Swelling in facial/No issues with shellfish    Azithromycin Hives    No problem with Biaxin    Levofloxacin     Tendon tears   Misc. Sulfonamide Containing Compounds    Montelukast Other (See Comments)   Montelukast Sodium Swelling   Cefaclor Rash   Penicillins Swelling   Sulfa Antibiotics Rash   Sulfonamide Derivatives Rash    Objective:  Debra Byrd is a pleasant 78 y.o. female in NAD. AAO x 3.  Vascular Examination: Patient has palpable DP pulse, absent PT pulse bilateral.  Delayed capillary refill bilateral toes.  Sparse digital hair bilateral.  Proximal to distal cooling WNL bilateral.    Dermatological Examination: Interspaces are clear with no open lesions noted bilateral.  Skin is shiny and atrophic bilateral.  Nails are 3-54mm thick, with yellowish/brown discoloration, subungual debris and distal onycholysis x10.  There is pain with compression of nails x10.  There are hyperkeratotic lesions noted bilateral submet 1.  Patient qualifies for at-risk foot care because of PVD.  Assessment/Plan: 1. Pain due to onychomycosis of toenails of both feet   2. History of TIA (transient ischemic attack)   3. Callus   4. PVD (peripheral vascular disease) (HCC)    Mycotic nails x10 were sharply debrided with sterile nail nippers and power debriding burr to decrease bulk and length.  Hyperkeratotic lesions  bilateral submet 1 were shaved with #312 blade.  Follow-up 3 months   Bre Pecina DSABRA Imperial, DPM, FACFAS [facility]     [address] [place], [place] [address]                Office [phone]  Fax [phone]

## 2024-04-14 ENCOUNTER — Encounter: Payer: Self-pay | Admitting: Podiatry

## 2024-04-14 DIAGNOSIS — R768 Other specified abnormal immunological findings in serum: Secondary | ICD-10-CM | POA: Insufficient documentation

## 2024-04-14 DIAGNOSIS — M810 Age-related osteoporosis without current pathological fracture: Secondary | ICD-10-CM | POA: Insufficient documentation

## 2024-04-14 DIAGNOSIS — M1990 Unspecified osteoarthritis, unspecified site: Secondary | ICD-10-CM | POA: Insufficient documentation

## 2024-04-15 NOTE — Progress Notes (Signed)
 " Cardiology Office Note   Date:  04/18/2024  ID:  Debra, Byrd [birth year], MRN [id number] PCP: Debra Mickey Browner, PA-C  [facility] Providers Cardiologist:  Debra Leiter, MD     History of Present Illness Debra Byrd is a 78 y.o. female with a past medical history of CAD, hypertension, carotid artery stenosis.  04/02/2024 echo EF 60 to 65%, impaired relaxation, mild to moderate MR, trace aortic regurgitation, mild TR 07/06/2023 monitor average heart rate 57 bpm, predominant rhythm was sinus, triggered events associated with APCs 12/06/2022 MPI negative for ischemia  She has a longstanding patient of Dr. Leiter for the management of APCs and carotid artery stenosis.  She had a MPI in 2024 which was negative for ischemia.  She also wore a monitor in 2024 revealing she was predominantly in sinus rhythm and triggered events associated with APCs.  She was admitted to [facility], had a TIA, was started on aspirin and Plavix  x 21 days and advised to follow-up with neurology in 1 month. EEG was negative.  Left parietal AVM noted on MRI, 04/01/2024 MRA of the neck stenosis was unremarkable.  She presents today after hospitalization as outlined above.  She is doing okay from a cardiac perspective.  She has plans to see neurology in about another month.  Overall she is asymptomatic from a cardiac perspective.  He has not noticed any palpitations, dizziness. She denies chest pain, palpitations, dyspnea, pnd, orthopnea, n, v, dizziness, syncope, edema, weight gain, or early satiety.   ROS: Review of Systems  All other systems reviewed and are negative.    Studies Reviewed      Cardiac Studies & Procedures   ______________________________________________________________________________________________   STRESS TESTS  MYOCARDIAL PERFUSION IMAGING 12/06/2022      MONITORS  LONG TERM MONITOR (3-14 DAYS) 07/28/2023  Narrative Patch Wear Time:  13 days and 3 hours  (2024-11-15T03:29:59-0500 to 2024-11-28T07:29:07-499)  Patient had a min HR of 42 bpm, max HR of 138 bpm, and avg HR of 57 bpm. Predominant underlying rhythm was Sinus Rhythm.  There were 4 triggered and 6 diary events.  All sinus rhythm and at times associated with occasional APC.  There were no sinus pauses of 3 seconds or greater and no episodes of second or third-degree AV nodal block.  There were no episodes of atrial fibrillation or flutter.  3 episode of consecutive APCs occurred, the run with the fastest interval lasting 6 beats with a max rate of 138 bpm, the longest lasting 5 beats with an avg rate of 114 bpm.  Isolated SVEs were occasional (3.1%, 23584), SVE Couplets were rare (<1.0%, 1113), and SVE Triplets were rare (<1.0%, 118).  Isolated VEs were rare (<1.0%), and no VE Couplets or VE Triplets were present. Ventricular Bigeminy was present.       ______________________________________________________________________________________________      Risk Assessment/Calculations      Physical Exam VS:  BP 130/64   Pulse 88   Ht 5' 1 (1.549 m)   Wt 146 lb (66.2 kg)   SpO2 100%   BMI 27.59 kg/m        Wt Readings from Last 3 Encounters:  04/16/24 146 lb (66.2 kg)  03/05/24 151 lb 3.2 oz (68.6 kg)  12/04/23 153 lb 6.4 oz (69.6 kg)    GEN: Well nourished, well developed in no acute distress NECK: No JVD; No carotid bruits CARDIAC: RRR, no murmurs, rubs, gallops RESPIRATORY:  Clear to auscultation without rales, wheezing  or rhonchi  ABDOMEN: Soft, non-tender, non-distended EXTREMITIES:  No edema; No deformity   ASSESSMENT AND PLAN TIA-workup was unrevealing for contributory causes.  Will arrange for monitor to assess for any episodes of atrial fibrillation.  Continue aspirin 81 mg daily, Plavix  75 mg daily x 21 days and then she will continue with monotherapy aspirin.  On Crestor  5 mg daily.  Carotid artery stenosis-imaging while she was inpatient revealed  nonhemodynamic significance of her carotid artery stenosis.  She is on aspirin 81 mg daily, as well as Plavix  75 mg daily for 21 days, as well as Crestor  5 mg daily.  Dyslipidemia-LDL is well-controlled at 36, continue Crestor  5 mg daily.  Hypertension-blood pressure is controlled at 130/64, continue Micardis  20 mg daily.  APCs-quiescent.       Dispo: 2-week monitor to assess for atrial fibrillation, keep follow-up with Dr. Monetta in November.  Signed, Debra JAYSON Hoover, NP  "

## 2024-04-16 ENCOUNTER — Encounter: Payer: Self-pay | Admitting: Cardiology

## 2024-04-16 ENCOUNTER — Ambulatory Visit (INDEPENDENT_AMBULATORY_CARE_PROVIDER_SITE_OTHER)

## 2024-04-16 ENCOUNTER — Ambulatory Visit: Attending: Cardiology | Admitting: Cardiology

## 2024-04-16 VITALS — BP 130/64 | HR 88 | Ht 61.0 in | Wt 146.0 lb

## 2024-04-16 DIAGNOSIS — R002 Palpitations: Secondary | ICD-10-CM

## 2024-04-16 DIAGNOSIS — E782 Mixed hyperlipidemia: Secondary | ICD-10-CM | POA: Diagnosis present

## 2024-04-16 DIAGNOSIS — I491 Atrial premature depolarization: Secondary | ICD-10-CM | POA: Diagnosis present

## 2024-04-16 DIAGNOSIS — G459 Transient cerebral ischemic attack, unspecified: Secondary | ICD-10-CM

## 2024-04-16 DIAGNOSIS — I10 Essential (primary) hypertension: Secondary | ICD-10-CM

## 2024-04-16 DIAGNOSIS — I6521 Occlusion and stenosis of right carotid artery: Secondary | ICD-10-CM

## 2024-04-16 NOTE — Patient Instructions (Addendum)
 Medication Instruction Your physician recommends that you continue on your current medications as directed. Please refer to the Current Medication list given to you today.  *If you need a refill on your cardiac medications before your next appointment, please call your pharmacy*  Lab Work: NONE If you have labs (blood work) drawn today and your tests are completely normal, you will receive your results only by: MyChart Message (if you have MyChart) OR A paper copy in the mail If you have any lab test that is abnormal or we need to change your treatment, we will call you to review the results.  Testing/Procedures: You have been asked to wear a Zio Heart Monitor today. It is to be worn for 14 days. Please remove the monitor on 9/9  and mail back in the box provided.  If you have any questions about the monitor please call the company at [phone]    Follow-Up: At [facility], you and your health needs are our priority.  As part of our continuing mission to provide you with exceptional heart care, our providers are all part of one team.  This team includes your primary Cardiologist (physician) and Advanced Practice Providers or APPs (Physician Assistants and Nurse Practitioners) who all work together to provide you with the care you need, when you need it.  Your next appointment:  Nov. 12th at 1:00    Provider:   Redell Leiter, MD    We recommend signing up for the patient portal called MyChart.  Sign up information is provided on this After Visit Summary.  MyChart is used to connect with patients for Virtual Visits (Telemedicine).  Patients are able to view lab/test results, encounter notes, upcoming appointments, etc.  Non-urgent messages can be sent to your provider as well.   To learn more about what you can do with MyChart, go to [email or web address].   Other Instructions Your Cholesterol was good per Delon Hoover, NP

## 2024-04-24 ENCOUNTER — Telehealth: Payer: Self-pay | Admitting: Cardiology

## 2024-04-24 NOTE — Telephone Encounter (Signed)
 Pt is in the hospital and they have taken off her heart monitor and husband wants to know what they should do with it. Please advise

## 2024-04-26 NOTE — Telephone Encounter (Signed)
 Left message for the patient to call back.

## 2024-04-29 NOTE — Telephone Encounter (Signed)
 Left message for the patient to call back.

## 2024-04-30 NOTE — Telephone Encounter (Signed)
 Called the patient and she stated that she had called the phone number on the Zio card and they had advised her to send in the monitor and then based on the results it would be up to the doctor if they wanted to put another heart monitor on the patient. The patient had to take the heart monitor off because she was in the hospital and they needed to perform an MRI. I advised her that when the doctor got the results of the heart monitor they would decide if another heart monitor was needed at that point. Patient verbalized understanding and had no further questions at this time.

## 2024-05-09 ENCOUNTER — Ambulatory Visit: Payer: Self-pay | Admitting: Cardiology

## 2024-05-09 DIAGNOSIS — R002 Palpitations: Secondary | ICD-10-CM

## 2024-05-10 NOTE — Telephone Encounter (Signed)
 Left detailed message on voicemail and let pt know results would be on MyChart and if any questions she may call here at [phone] in pod D

## 2024-05-10 NOTE — Telephone Encounter (Signed)
-----   Message from Delon JAYSON Hoover sent at 05/09/2024  6:32 PM EDT ----- Monitor revealed on average her heart rate is 56 bpm and you are mostly in a sinus rhythm.  Your triggered events were associated with the APCs which we already knew that you had.  There were no  prolonged pauses or episodes of atrial fibrillation or flutter--this is really what we were looking for and said this is a good result. ----- Message ----- From: Monetta Redell PARAS, MD Sent: 05/09/2024   9:24 AM EDT To: Delon JAYSON Hoover, NP

## 2024-05-30 ENCOUNTER — Encounter: Payer: Self-pay | Admitting: Cardiology

## 2024-05-30 ENCOUNTER — Ambulatory Visit: Attending: Cardiology | Admitting: Cardiology

## 2024-05-30 ENCOUNTER — Encounter: Payer: Self-pay | Admitting: *Deleted

## 2024-05-30 VITALS — BP 108/64 | HR 61 | Ht 61.0 in | Wt 146.4 lb

## 2024-05-30 DIAGNOSIS — E782 Mixed hyperlipidemia: Secondary | ICD-10-CM | POA: Diagnosis present

## 2024-05-30 DIAGNOSIS — I491 Atrial premature depolarization: Secondary | ICD-10-CM | POA: Diagnosis not present

## 2024-05-30 DIAGNOSIS — I6521 Occlusion and stenosis of right carotid artery: Secondary | ICD-10-CM | POA: Insufficient documentation

## 2024-05-30 DIAGNOSIS — I340 Nonrheumatic mitral (valve) insufficiency: Secondary | ICD-10-CM | POA: Insufficient documentation

## 2024-05-30 DIAGNOSIS — I10 Essential (primary) hypertension: Secondary | ICD-10-CM | POA: Diagnosis present

## 2024-05-30 NOTE — Patient Instructions (Signed)
 Medication Instructions:  Your physician recommends that you continue on your current medications as directed. Please refer to the Current Medication list given to you today.  *If you need a refill on your cardiac medications before your next appointment, please call your pharmacy*  Lab Work: None If you have labs (blood work) drawn today and your tests are completely normal, you will receive your results only by: MyChart Message (if you have MyChart) OR A paper copy in the mail If you have any lab test that is abnormal or we need to change your treatment, we will call you to review the results.  Testing/Procedures: None  Follow-Up: At [facility], you and your health needs are our priority.  As part of our continuing mission to provide you with exceptional heart care, our providers are all part of one team.  This team includes your primary Cardiologist (physician) and Advanced Practice Providers or APPs (Physician Assistants and Nurse Practitioners) who all work together to provide you with the care you need, when you need it.  Your next appointment:   9 month(s)  Provider:   Zoe Hinds, MD    We recommend signing up for the patient portal called "MyChart".  Sign up information is provided on this After Visit Summary.  MyChart is used to connect with patients for Virtual Visits (Telemedicine).  Patients are able to view lab/test results, encounter notes, upcoming appointments, etc.  Non-urgent messages can be sent to your provider as well.   To learn more about what you can do with MyChart, go to [email or web address].   Other Instructions None

## 2024-05-30 NOTE — Progress Notes (Signed)
 Cardiology Office Note:    Date:  05/30/2024   ID:  Debra Byrd, DOB [birth year], MRN [id number]  PCP:  Hunter Mickey Browner, PA-C  Cardiologist:  Redell Leiter, MD    Referring MD: Hunter Mickey Browner, PA-C    ASSESSMENT:    1. APC (atrial premature contractions)   2. Stenosis of right carotid artery   3. Essential hypertension   4. Mixed hyperlipidemia   5. Non-rheumatic mitral regurgitation    PLAN:    In order of problems listed above:  Fortunately from cardiology perspective doing well not having obvious arrhythmia and does not require cardiac suppressant medications Stable she had recent MRA during [facility] admission no significant stenosis Well-controlled she asked me about taking minoxidil orally for hair and I told her I would not do it because I think she would faint with a systolic blood pressure of 106 Continue her lipid-lowering treatment Stable mitral regurgitation no evidence of pulmonary hypertension   Next appointment: 9 months per request   Medication Adjustments/Labs and Tests Ordered: Current medicines are reviewed at length with the patient today.  Concerns regarding medicines are outlined above.  Orders Placed This Encounter  Procedures   EKG 12-Lead   No orders of the defined types were placed in this encounter.    History of Present Illness:    Debra Byrd is a 78 y.o. female with a hx of symptomatic APCs hypertension hyperlipidemia mild mitral regurgitation right internal carotid artery stenosis stable with serial imaging last seen 10/06/2023.  [facility] 04/02/2024 Mitral calcification mild to moderate MR no evidence of pulmonary artery hypertension.  Compliance with diet, lifestyle and medications: Yes  She is quite shaken by her hospitalization with apparent TIA she has appointment to be seen by neuro [facility] and they placed her on aspirin rather than clopidogrel  chest pain which she should take I told her both are good  cardiology tends to favor clopidogrel  I reviewed the echocardiogram done at the hospital with her Cardiovascular symptoms edema shortness of breath chest pain palpitation or syncope She is off for biologic agent for severe asthma and she has severe diffuse pain has been diagnosed with fibro myalgia On top of everything else she has a problem with her jaw perhaps related to treatment for osteoporosis and is facing surgical intervention Past Medical History:  Diagnosis Date   Acute left flank pain 04/09/2020   ACUTE MUCOID OTITIS MEDIA 08/08/2007   Qualifier: Diagnosis of   By: Neysa MD, Clinton D        Allergic rhinitis with postnasal drip 10/11/2015   Allergic rhinitis, cause unspecified    APC (atrial premature contractions) 07/06/2015   Arthralgia 08/08/2023   Asthma    Asthma, moderate persistent 02/12/2010   PFT 01/2011- FEV1/FVC 0.72; mild to mod obstruction w/ response to bronchodilator  Offce Spirometry 08/08/2016-NL. FVC 2.04/77%, FEV1 1.64/81%, FEV1/FVC 0.80        Atelectasis of left lung 12/04/2020   Atypical chest pain 12/02/2020   Back abrasion 10/04/2023   Chronic airway obstruction, not elsewhere classified    Chronic hoarseness 06/04/2007   Annotation: probable  Qualifier: Diagnosis of   By: Lauralee CMA, Katie         Coccyx contusion 10/04/2023   Contusion of left thumb 10/04/2023   Coronary atherosclerosis 08/13/2008   Qualifier: Diagnosis of   By: Neysa MD, Reggy BIRCH     IMO SNOMED Dx Update Oct 2024     Coronary atherosclerosis of unspecified type  of vessel, native or graft    COVID-19 09/2020   COVID-19 virus infection 10/16/2020   Cystocele with prolapse 06/15/2020   Depression 04/18/2020   Drug allergy , antibiotic 08/24/2017   Penicillin caused edema, Z-Pak may have caused rash     Dysfunction of right eustachian tube 09/07/2019   Encounter for Medicare annual wellness exam 01/23/2018   Essential hypertension 07/06/2015   Feeling of incomplete bladder  emptying 07/10/2023   Fibromyalgia 05/28/2019   GERD (gastroesophageal reflux disease) 03/20/2017   Hyperlipidemia 07/06/2015   Impaired fasting glucose 05/26/2016   Formatting of this note might be different from the original.  Stable with diet changes     Loose stools 01/09/2018   Low back pain 05/28/2019   Lower respiratory infection (e.g., bronchitis, pneumonia, pneumonitis, pulmonitis) 08/03/2017   Mitral valve disease 06/04/2007   Qualifier: Diagnosis of   By: Lauralee CMA, Katie      IMO SNOMED Dx Update Oct 2024     Mitral valve disorders(424.0)    Moderate persistent asthmatic bronchitis with exacerbation 10/19/2020   Mononeuritis 06/04/2007   Qualifier: Diagnosis of   By: Lauralee CMA, Katie      IMO SNOMED Dx Update Oct 2024     Mononeuritis of unspecified site    Myalgia and myositis, unspecified    Non-intractable vomiting with nausea 01/09/2018   Non-rheumatic mitral regurgitation 07/06/2015   OAB (overactive bladder) 08/26/2022   Pain in right shoulder 11/20/2019   Palpitation 07/06/2015   Pelvic floor weakness 07/10/2023   Pelvic pain 07/10/2023   Peripheral vascular disease, unspecified    Prediabetes 02/28/2020   Preoperative clearance 09/13/2022   Seasonal and perennial allergic rhinitis 06/04/2007   Qualifier: Diagnosis of   By: Lauralee CMA, Katie         SI joint arthritis 05/28/2019   Sleep disturbance, unspecified    Spondylolisthesis, lumbar region 05/28/2019   Stenosis of right carotid artery 07/06/2015   Overview:   MRA 07/10/14:  At the RIGHT carotid bifurcation, there is estimated 50% stenosis at the RIGHT internal carotid artery origin. Minimal non stenotic atheromatous change LEFT carotid bifurcation. No measurable LEFT ICA disease  Overview:   Overview:   MRA 07/10/14:  At the RIGHT carotid bifurcation, there is estimated 50% stenosis at the RIGHT internal carotid artery origin. Minimal non s   TIA (transient ischemic attack) 04/02/2024   Evaluated and  [facility] at [facility]   Trochanteric bursitis, left hip 05/28/2019   Unspecified glaucoma 06/04/2007   Qualifier: Diagnosis of   By: Lauralee CMA, Katie      IMO SNOMED Dx Update Oct 2024     Unspecified glaucoma(365.9)    Urge incontinence 08/26/2022   Uterovaginal prolapse, incomplete 10/21/2022   Vaginal atrophy 08/26/2022   Vertigo 08/27/2019    Current Medications: Current Meds  Medication Sig   albuterol  (PROAIR  HFA) 108 (90 Base) MCG/ACT inhaler Inhale 2 puffs into the lungs every 6 (six) hours as needed for wheezing.   albuterol  (PROVENTIL ) (2.5 MG/3ML) 0.083% nebulizer solution Take 3 mLs (2.5 mg total) by nebulization 4 (four) times daily as needed for wheezing.   Ascorbic Acid (VITAMIN C) 1000 MG tablet Take 1,000 mg by mouth daily.   aspirin EC 81 MG tablet Take 81 mg by mouth.   Baclofen  5 MG TABS Take 1 tablet (5 mg total) by mouth every 8 (eight) hours as needed.   cholecalciferol (VITAMIN D) 1000 UNITS tablet Take 3,000 Units by mouth daily.   Coenzyme  Q10 (COQ10) 100 MG CAPS Take 1 capsule by mouth daily.   dexlansoprazole (DEXILANT) 60 MG capsule Take 1 capsule by mouth daily.   diclofenac  sodium (VOLTAREN ) 1 % GEL Apply 4 g topically 4 (four) times daily.   EPINEPHrine  0.3 mg/0.3 mL IJ SOAJ injection Inject 0.3 mg into the muscle as needed for anaphylaxis.   estradiol (ESTRACE) 0.01 % CREA vaginal cream Place 1 Applicatorful vaginally.   finasteride (PROSCAR) 5 MG tablet Take 2.5 mg by mouth daily.   fish oil-omega-3 fatty acids 1000 MG capsule Take 1 g by mouth daily. 4 by mouth daily   fluticasone  (FLONASE ) 50 MCG/ACT nasal spray 1-2 puffs each nostril daily while needed   fluticasone -salmeterol (ADVAIR) 250-50 MCG/ACT AEPB Inhale 1 puff into the lungs in the morning and at bedtime.   gabapentin (NEURONTIN) 600 MG tablet Take 600 mg by mouth at bedtime.   lidocaine (LIDODERM) 5 % Place 1 patch onto the skin daily.   loratadine  (CLARITIN ) 10 MG tablet Take 1  tablet (10 mg total) by mouth daily.   meclizine (ANTIVERT) 12.5 MG tablet Take 12.5 mg by mouth 3 (three) times daily as needed for dizziness.   potassium chloride  (KLOR-CON ) 10 MEQ tablet TAKE 1 TABLET BY MOUTH EVERY DAY   pyridOXINE (VITAMIN B-6) 25 MG tablet Take 25 mg by mouth daily.   RESTASIS 0.05 % ophthalmic emulsion Place 1 drop into both eyes 2 (two) times daily.   rosuvastatin  (CRESTOR ) 5 MG tablet TAKE 1 TABLET (5 MG TOTAL) BY MOUTH DAILY.   Tafluprost, PF, 0.0015 % SOLN Apply to eye.   telmisartan  (MICARDIS ) 20 MG tablet Take 1 tablet (20 mg total) by mouth at bedtime.   vitamin B-12 (CYANOCOBALAMIN) 1000 MCG tablet Take 1,000 mcg by mouth daily.      EKGs/Labs/Other Studies Reviewed:    The following studies were reviewed today:  Cardiac Studies & Procedures   ______________________________________________________________________________________________   STRESS TESTS  MYOCARDIAL PERFUSION IMAGING 12/06/2022      MONITORS  LONG TERM MONITOR (3-14 DAYS) 05/08/2024  Narrative Patch Wear Time:  11 days and 18 hours (2025-08-26T16:27:43-0400 to 2025-09-07T10:40:27-0400)  Patient had a min HR of 41 bpm, max HR of 97 bpm, and avg HR of 56 bpm. Predominant underlying rhythm was Sinus Rhythm.  There were 3 triggered and 5 diary events all sinus rhythm and several with frequent APCs and repeated APCs.  There were no sinus pauses of 3 seconds or greater and no episodes of second or third-degree AV nodal block.  There were no episodes of atrial fibrillation or flutter.  Isolated SVEs were rare (<1.0%), SVE Couplets were rare (<1.0%), and SVE Triplets were rare (<1.0%).  Isolated VEs were rare (<1.0%), and no VE Couplets or VE Triplets were present. Ventricular Bigeminy was present.       ______________________________________________________________________________________________      EKG Interpretation Date/Time:  Thursday May 30 2024 13:06:19  EDT Ventricular Rate:  61 PR Interval:  158 QRS Duration:  80 QT Interval:  414 QTC Calculation: 416 R Axis:   43  Text Interpretation: Normal sinus rhythm with sinus arrhythmia Low voltage QRS When compared with ECG of 17-Apr-2000 11:00, No significant change was found Confirmed by Monetta Rogue ([id number]) on 05/30/2024 1:12:56 PM   Recent Labs: No results found for requested labs within last 365 days.  Recent Lipid Panel    Component Value Date/Time   CHOL 145 08/08/2018 0940   TRIG 85 08/08/2018 0940   HDL 67 08/08/2018  0940   CHOLHDL 2.2 08/08/2018 0940   LDLCALC 61 08/08/2018 0940    Physical Exam:    VS:  BP 108/64   Pulse 61   Ht 5' 1 (1.549 m)   Wt 146 lb 6.4 oz (66.4 kg)   SpO2 97%   BMI 27.66 kg/m     Wt Readings from Last 3 Encounters:  05/30/24 146 lb 6.4 oz (66.4 kg)  04/16/24 146 lb (66.2 kg)  03/05/24 151 lb 3.2 oz (68.6 kg)     GEN:  Well nourished, well developed in no acute distress HEENT: Normal NECK: No JVD; No carotid bruits LYMPHATICS: No lymphadenopathy CARDIAC: RRR, no murmurs, rubs, gallops RESPIRATORY:  Clear to auscultation without rales, wheezing or rhonchi  ABDOMEN: Soft, non-tender, non-distended MUSCULOSKELETAL:  No edema; No deformity  SKIN: Warm and dry NEUROLOGIC:  Alert and oriented x 3 PSYCHIATRIC:  Normal affect    Signed, Redell Leiter, MD  05/30/2024 1:31 PM    [facility]

## 2024-06-06 ENCOUNTER — Ambulatory Visit: Admitting: Internal Medicine

## 2024-06-24 ENCOUNTER — Encounter: Payer: Self-pay | Admitting: Radiology

## 2024-07-02 ENCOUNTER — Ambulatory Visit: Admitting: Cardiology

## 2024-07-03 ENCOUNTER — Ambulatory Visit: Admitting: Cardiology

## 2024-07-10 ENCOUNTER — Ambulatory Visit (INDEPENDENT_AMBULATORY_CARE_PROVIDER_SITE_OTHER): Admitting: Podiatry

## 2024-07-10 DIAGNOSIS — M79674 Pain in right toe(s): Secondary | ICD-10-CM | POA: Diagnosis not present

## 2024-07-10 DIAGNOSIS — L84 Corns and callosities: Secondary | ICD-10-CM

## 2024-07-10 DIAGNOSIS — B351 Tinea unguium: Secondary | ICD-10-CM | POA: Diagnosis not present

## 2024-07-10 DIAGNOSIS — I739 Peripheral vascular disease, unspecified: Secondary | ICD-10-CM

## 2024-07-10 DIAGNOSIS — M79675 Pain in left toe(s): Secondary | ICD-10-CM

## 2024-07-10 NOTE — Progress Notes (Signed)
 Subjective:  Patient ID: Debra Byrd, female    DOB: [birth year],  MRN: [id number]  Debra Byrd presents to clinic today for:  Chief Complaint  Patient presents with   RFC    Prediabetic. Takes asa 81. Callous skin bilaterally, medial side 1st hallux.    Patient notes nails are thick and elongated, causing pain in shoe gear when ambulating.  She has painful calluses bilateral submet 1.   PCP is [facility], Mickey Browner, PA-C.  Last seen on 04/09/2024  Past Medical History:  Diagnosis Date   Acute left flank pain 04/09/2020   ACUTE MUCOID OTITIS MEDIA 08/08/2007   Qualifier: Diagnosis of   By: Neysa MD, Clinton D        Allergic rhinitis with postnasal drip 10/11/2015   Allergic rhinitis, cause unspecified    APC (atrial premature contractions) 07/06/2015   Arthralgia 08/08/2023   Asthma    Asthma, moderate persistent 02/12/2010   PFT 01/2011- FEV1/FVC 0.72; mild to mod obstruction w/ response to bronchodilator  Offce Spirometry 08/08/2016-NL. FVC 2.04/77%, FEV1 1.64/81%, FEV1/FVC 0.80        Atelectasis of left lung 12/04/2020   Atypical chest pain 12/02/2020   Back abrasion 10/04/2023   Chronic airway obstruction, not elsewhere classified    Chronic hoarseness 06/04/2007   Annotation: probable  Qualifier: Diagnosis of   By: Lauralee CMA, Katie         Coccyx contusion 10/04/2023   Contusion of left thumb 10/04/2023   Coronary atherosclerosis 08/13/2008   Qualifier: Diagnosis of   By: Neysa MD, Clinton D     IMO SNOMED Dx Update Oct 2024     Coronary atherosclerosis of unspecified type of vessel, native or graft    COVID-19 09/2020   COVID-19 virus infection 10/16/2020   Cystocele with prolapse 06/15/2020   Depression 04/18/2020   Drug allergy , antibiotic 08/24/2017   Penicillin caused edema, Z-Pak may have caused rash     Dysfunction of right eustachian tube 09/07/2019   Encounter for Medicare annual wellness exam 01/23/2018   Essential hypertension 07/06/2015   Feeling  of incomplete bladder emptying 07/10/2023   Fibromyalgia 05/28/2019   GERD (gastroesophageal reflux disease) 03/20/2017   Hyperlipidemia 07/06/2015   Impaired fasting glucose 05/26/2016   Formatting of this note might be different from the original.  Stable with diet changes     Loose stools 01/09/2018   Low back pain 05/28/2019   Lower respiratory infection (e.g., bronchitis, pneumonia, pneumonitis, pulmonitis) 08/03/2017   Mitral valve disease 06/04/2007   Qualifier: Diagnosis of   By: Lauralee CMA, Katie      IMO SNOMED Dx Update Oct 2024     Mitral valve disorders(424.0)    Moderate persistent asthmatic bronchitis with exacerbation 10/19/2020   Mononeuritis 06/04/2007   Qualifier: Diagnosis of   By: Lauralee CMA, Katie      IMO SNOMED Dx Update Oct 2024     Mononeuritis of unspecified site    Myalgia and myositis, unspecified    Non-intractable vomiting with nausea 01/09/2018   Non-rheumatic mitral regurgitation 07/06/2015   OAB (overactive bladder) 08/26/2022   Pain in right shoulder 11/20/2019   Palpitation 07/06/2015   Pelvic floor weakness 07/10/2023   Pelvic pain 07/10/2023   Peripheral vascular disease, unspecified    Prediabetes 02/28/2020   Preoperative clearance 09/13/2022   Seasonal and perennial allergic rhinitis 06/04/2007   Qualifier: Diagnosis of   By: Lauralee CMA, Katie  SI joint arthritis 05/28/2019   Sleep disturbance, unspecified    Spondylolisthesis, lumbar region 05/28/2019   Stenosis of right carotid artery 07/06/2015   Overview:   MRA 07/10/14:  At the RIGHT carotid bifurcation, there is estimated 50% stenosis at the RIGHT internal carotid artery origin. Minimal non stenotic atheromatous change LEFT carotid bifurcation. No measurable LEFT ICA disease  Overview:   Overview:   MRA 07/10/14:  At the RIGHT carotid bifurcation, there is estimated 50% stenosis at the RIGHT internal carotid artery origin. Minimal non s   TIA (transient ischemic attack)  04/02/2024   Evaluated and treated at [facility]   Trochanteric bursitis, left hip 05/28/2019   Unspecified glaucoma 06/04/2007   Qualifier: Diagnosis of   By: Lauralee CMA, Katie      IMO SNOMED Dx Update Oct 2024     Unspecified glaucoma(365.9)    Urge incontinence 08/26/2022   Uterovaginal prolapse, incomplete 10/21/2022   Vaginal atrophy 08/26/2022   Vertigo 08/27/2019   Allergies  Allergen Reactions   Iodinated Contrast Media Shortness Of Breath and Swelling    Swelling in facial/No issues with shellfish    Azithromycin Hives    No problem with Biaxin    Levofloxacin     Tendon tears   Misc. Sulfonamide Containing Compounds    Montelukast Other (See Comments)   Montelukast Sodium Swelling   Cefaclor Rash   Penicillins Swelling   Sulfa Antibiotics Rash   Sulfonamide Derivatives Rash    Objective:  [name] is a pleasant 78 y.o. female in NAD. AAO x 3.  Vascular Examination: Patient has palpable DP pulse, absent PT pulse bilateral.  Delayed capillary refill bilateral toes.  Sparse digital hair bilateral.  Proximal to distal cooling WNL bilateral.    Dermatological Examination: Interspaces are clear with no open lesions noted bilateral.  Skin is shiny and atrophic bilateral.  Nails are 3-43mm thick, with yellowish/brown discoloration, subungual debris and distal onycholysis x10.  There is pain with compression of nails x10.  There are hyperkeratotic lesions noted bilateral submet 1.  Patient qualifies for at-risk foot care because of PVD.  Assessment/Plan: 1. Pain due to onychomycosis of toenails of both feet   2. Callus   3. PVD (peripheral vascular disease)    Mycotic nails x10 were sharply debrided with sterile nail nippers and power debriding burr to decrease bulk and length.  Hyperkeratotic lesions bilateral submet 1 were shaved with #312 blade.  Follow-up 3 months   Aissata Wilmore DSABRA Imperial, DPM, FACFAS [facility]     [address] [place], [place] [address]                Office [phone]  Fax [phone]

## 2024-08-05 NOTE — Progress Notes (Deleted)
 Subjective:    Patient ID: Debra Byrd, female    DOB: 01-03-1946, 78 y.o.   MRN: [id number]  HPI Female never smoker followed for chronic obstructive asthma/Xolair /often steroid dependent, complicated by allergic rhinitis, peripheral neuropathy, glaucoma,  Offce Spirometry 08/08/2016-NL. FVC 2.04/77%, FEV1 1.64/81%, FEV1/FVC 0.80 HST 07/26/13- WNL  AHI 4.4/ hr, desaturation to 73%, body weight 175 lbs FeNO elevated =============================================================   03/05/24-   78 year old female never smoker followed for chronic obstructive asthma(Xolair , then Dupixent  >Reaction?) often steroid dependent, complicated by allergic rhinitis, peripheral neuropathy, Glaucoma, Covid infection Feb 2022, GERD,  -Wixela 250/ Advair, pro-air HFA, neb albuterol , benzonatate , Flonase , Off Dupixent  after ?? Allergic Reaction with arthralgias after Dupixent  injection 07/13/23. Ongoing but shifting pain and stiffness especially in the hands (now improved) and legs. Rheumatologist calls it flared fibromyalgia. ------Doing well at this time Still not needing rescue inhaler after being off Dupixent  almost a year. Does note more rhinitis/ postnasal drip. Discussed the use of AI scribe software for clinical note transcription with the patient, who gave verbal consent to proceed.  History of Present Illness   Debra Byrd is a 78 year old female with chronic obstructive asthma who presents for follow-up of her asthma management.  She previously managed her chronic obstructive asthma with steroid dependency and Xolair  injections before switching to Dupixent , which she discontinued six to seven months ago due to a possible allergic reaction with marked arthralgias. Her Rheumatologist says she had a flare of fibromyalgia.  Clear relation to Dupixent  not established, but so far only rhinitis is worse off Dupixent . Currently, she experiences minimal wheezing and no asthma attacks, rarely needing her  rescue inhaler. Her medications include Advair and Flonase  nasal spray.  She experienced rhinitis symptoms, including nasal dripping and stuffiness, starting in January, which improved after April. These symptoms were more pronounced after stopping Dupixent . She has no headaches or concerns about a sinus infection.     Assessment and Plan:    Chronic obstructive asthma- moderate persistent uncomplicated Previously steroid-dependent, now well-controlled. No recent exacerbations. Off Dupixent  for 6-7 months. Minimal rescue inhaler and nebulizer use. - Use rescue inhaler as needed.  Allergic rhinitis Symptoms improved post-Dupixent  discontinuation. No sinus infection. Using Flonase . - Continue Flonase  nasal spray.    08/06/24- 78 year old female never smoker followed for chronic obstructive asthma(Xolair , then Dupixent  >Reaction?) often steroid dependent, complicated by allergic rhinitis, peripheral neuropathy, Glaucoma, Covid infection Feb 2022, GERD,  -Wixela 250/ Advair, pro-air HFA, neb albuterol , benzonatate , Flonase , Off Dupixent  after ?? Allergic Reaction with arthralgias after Dupixent  injection 07/13/23. Ongoing but shifting pain and stiffness especially in the hands (now improved) and legs. Rheumatologist calls it flared fibromyalgia. Remains off Dupixent  now over a year.      ROS-see HPI-+ = positive Constitutional:   No-   weight loss, night sweats, fevers, chills, +fatigue, lassitude. HEENT:   No-   difficulty swallowing, tooth/dental problems, sore throat,       No-  sneezing, itching, ear ache,  nasal congestion, post nasal drip,  CV:  + Noncardiac chest pain, no- orthopnea, PND, swelling in lower extremities,                                           anasarca, dizziness, palpitations Resp:+shortness of breath with exertion or at rest.               productive  cough,   non-productive cough,  No- coughing up of blood.                change in color of mucus.  +Little  wheezing.   Skin: No-   rash or lesions. GI:  No-   heartburn, indigestion, abdominal pain, nausea, vomiting,  GU: pain. MS:  +joint pain or swelling.  +Diffuse somatic pains Neuro-     nothing unusualk pain. Psych:  No- change in mood or affect. No depression or anxiety.  No memory loss.   Objective:   Physical Exam              General- Alert, Oriented, Affect-appropriate, Distress-none acute, + overweight,  Skin- rash-none, lesions- none, excoriation- none Lymphadenopathy- none Head- atraumatic            Eyes- Gross vision intact, PERRLA, conjunctivae clear secretions. Chronic periorbital edema.            Ears- Hearing, canals-normal            Nose- Clear, no-Septal dev, mucus, polyps, erosion, perforation             Throat- Mallampati II-III , mucosa clear , drainage- none, tonsils- atrophic,  Neck- flexible , trachea midline, no stridor , thyroid nl, carotid no bruit Chest - symmetrical excursion , unlabored           Heart/CV- RRR , no murmur , no gallop  , no rub, nl s1 s2                           - JVD- none , edema- none, stasis changes- none, varices- none           Lung- clear, unlabored, dullness-none, rub- none,  Cough- none           Chest wall-  Abd-  Br/ Gen/ Rectal- Not done, not indicated Extrem- cyanosis- none, clubbing, none, atrophy- none, strength- nl Neuro- grossly intact to observation

## 2024-08-06 ENCOUNTER — Ambulatory Visit: Admitting: Internal Medicine

## 2024-08-06 ENCOUNTER — Ambulatory Visit (INDEPENDENT_AMBULATORY_CARE_PROVIDER_SITE_OTHER): Admitting: Internal Medicine

## 2024-08-06 ENCOUNTER — Encounter: Payer: Self-pay | Admitting: Internal Medicine

## 2024-08-06 VITALS — BP 114/76 | HR 61 | Temp 97.5°F | Ht 60.0 in | Wt 144.4 lb

## 2024-08-06 DIAGNOSIS — J4489 Other specified chronic obstructive pulmonary disease: Secondary | ICD-10-CM

## 2024-08-06 DIAGNOSIS — J441 Chronic obstructive pulmonary disease with (acute) exacerbation: Secondary | ICD-10-CM

## 2024-08-06 MED ORDER — CLARITHROMYCIN 500 MG PO TABS: 500.0000 mg | ORAL_TABLET | Freq: Two times a day (BID) | ORAL | 1 refills | Status: AC

## 2024-08-06 MED ORDER — PREDNISONE 10 MG PO TABS: ORAL_TABLET | ORAL | 1 refills | Status: AC

## 2024-08-06 NOTE — Patient Instructions (Signed)
 Refills to hold sent for prednisone  and Biaxin   At checkout, ask them to brring you back wih Dr Kassie at [facility] in 6 months for asthma follow-up.

## 2024-08-06 NOTE — Progress Notes (Signed)
 " Subjective:    Patient ID: Janae LITTIE Sharps, female    DOB: Jan 10, 1946, 78 y.o.   MRN: [id number]  HPI Female never smoker followed for chronic obstructive asthma/Xolair /often steroid dependent, complicated by allergic rhinitis, peripheral neuropathy, glaucoma,  Offce Spirometry 08/08/2016-NL. FVC 2.04/77%, FEV1 1.64/81%, FEV1/FVC 0.80 HST 07/26/13- WNL  AHI 4.4/ hr, desaturation to 73%, body weight 175 lbs FeNO elevated =============================================================   03/05/24-   78 year old female never smoker followed for chronic obstructive asthma(Xolair , then Dupixent  >Reaction?) often steroid dependent, complicated by allergic rhinitis, peripheral neuropathy, Glaucoma, Covid infection Feb 2022, GERD,  -Wixela 250/ Advair, pro-air HFA, neb albuterol , benzonatate , Flonase , Off Dupixent  after ?? Allergic Reaction with arthralgias after Dupixent  injection 07/13/23. Ongoing but shifting pain and stiffness especially in the hands (now improved) and legs. Rheumatologist calls it flared fibromyalgia. ------Doing well at this time Still not needing rescue inhaler after being off Dupixent  almost a year. Does note more rhinitis/ postnasal drip. Discussed the use of AI scribe software for clinical note transcription with the patient, who gave verbal consent to proceed.  History of Present Illness   ANNELY SLIVA is a 78 year old female with chronic obstructive asthma who presents for follow-up of her asthma management.  She previously managed her chronic obstructive asthma with steroid dependency and Xolair  injections before switching to Dupixent , which she discontinued six to seven months ago due to a possible allergic reaction with marked arthralgias. Her Rheumatologist says she had a flare of fibromyalgia.  Clear relation to Dupixent  not established, but so far only rhinitis is worse off Dupixent . Currently, she experiences minimal wheezing and no asthma attacks, rarely needing her  rescue inhaler. Her medications include Advair and Flonase  nasal spray.  She experienced rhinitis symptoms, including nasal dripping and stuffiness, starting in January, which improved after April. These symptoms were more pronounced after stopping Dupixent . She has no headaches or concerns about a sinus infection.     Assessment and Plan:    Chronic obstructive asthma- moderate persistent uncomplicated Previously steroid-dependent, now well-controlled. No recent exacerbations. Off Dupixent  for 6-7 months. Minimal rescue inhaler and nebulizer use. - Use rescue inhaler as needed.  Allergic rhinitis Symptoms improved post-Dupixent  discontinuation. No sinus infection. Using Flonase . - Continue Flonase  nasal spray.    08/06/24- 78 year old female never smoker followed for chronic obstructive asthma(Xolair , then Dupixent  >Reaction?) often steroid dependent, complicated by allergic rhinitis, peripheral neuropathy, Glaucoma, Covid infection Feb 2022, GERD,  -Wixela 250/ Advair, pro-air HFA, neb albuterol , benzonatate , Flonase , Off Dupixent  after ?? Allergic Reaction with arthralgias after Dupixent  injection 07/13/23. Ongoing but shifting pain and stiffness especially in the hands (now improved) and legs. Rheumatologist calls it flared fibromyalgia. Remains off Dupixent  now over a year. -----Wheezing with cold weather. Discussed the use of AI scribe software for clinical note transcription with the patient, who gave verbal consent to proceed.  History of Present Illness   ROSANNE WOHLFARTH is a 78 year old female who presents for medication refills and management of eczema.  She requests refills for Biaxin  and prednisone , which she uses long term for respiratory symptoms. Cold air triggers her breathing problems/ wheezing, and she uses a scarf for protection. She has received the flu shot but has not yet received the RSV vaccine.  She has eczema with recent flare and uses prescription moisturizing  lotion and Claritin  twice daily. She previously used Xolair  and Dupixent  with some benefit but stopped them because of hand and leg swelling possibly related to Dupixent . She avoids  gatherings in winter to reduce illness exposure and carries an EpiPen  at all times.     Assessment and Plan:    Asthma/ COPD overlap with exacerbation She uses a scarf for cold air protection and avoids crowds. Received flu shot, advised RSV vaccine. Not on Dupixent  due to adverse reactions, aware of alternative biologics. - Prescribed Biaxin  with one refill for a week's supply. - Prescribed prednisone  with one refill. - Advised to get the RSV vaccine. - Recommended follow-up with Dr. Kassie in six months.     ROS-see HPI-+ = positive Constitutional:   No-   weight loss, night sweats, fevers, chills, +fatigue, lassitude. HEENT:   No-   difficulty swallowing, tooth/dental problems, sore throat,       No-  sneezing, itching, ear ache,  nasal congestion, post nasal drip,  CV:  + Noncardiac chest pain, no- orthopnea, PND, swelling in lower extremities,                                           anasarca, dizziness, palpitations Resp:+shortness of breath with exertion or at rest.               productive cough,   non-productive cough,  No- coughing up of blood.                change in color of mucus.  + wheezing.   Skin: No-   rash or lesions. GI:  No-   heartburn, indigestion, abdominal pain, nausea, vomiting,  GU: pain. MS:  +joint pain or swelling.  +Diffuse somatic pains Neuro-     nothing unusualk pain. Psych:  No- change in mood or affect. No depression or anxiety.  No memory loss.   Objective:   Physical Exam              General- Alert, Oriented, Affect-appropriate, Distress-none acute, + overweight,  Skin- rash-none, lesions- none, excoriation- none Lymphadenopathy- none Head- atraumatic            Eyes- Gross vision intact, PERRLA, conjunctivae clear secretions. Chronic periorbital edema.             Ears- Hearing, canals-normal            Nose- Clear, no-Septal dev, mucus, polyps, erosion, perforation             Throat- Mallampati II-III , mucosa clear , drainage- none, tonsils- atrophic,  Neck- flexible , trachea midline, no stridor , thyroid nl, carotid no bruit Chest - symmetrical excursion , unlabored           Heart/CV- RRR , no murmur , no gallop  , no rub, nl s1 s2                           - JVD- none , edema- none, stasis changes- none, varices- none           Lung- clear, unlabored, dullness-none, rub- none,  Cough- none           Chest wall-  Abd-  Br/ Gen/ Rectal- Not done, not indicated Extrem- cyanosis- none, clubbing, none, atrophy- none, strength- nl Neuro- grossly intact to observation     "

## 2024-08-12 ENCOUNTER — Telehealth: Payer: Self-pay | Admitting: Cardiology

## 2024-08-12 NOTE — Telephone Encounter (Signed)
 Pt calling stating she is having some dental work 08/26/24 and her periodontist told her to ask Dr. Monetta if she needs antibiotics prior to. Please advise.

## 2024-08-12 NOTE — Telephone Encounter (Signed)
 Called the patient and she stated that her periodontist told her to ask Dr. Monetta if she needed antibiotics prior to her procedure on 08/26/24. Spoke with Dr. Monetta and after reviewing her chart he stated that she did not need antibiotics prior to her dental visit on 1/5. This message was relayed to the patient and she verbalized understanding and had no further questions at this time.

## 2024-08-13 ENCOUNTER — Ambulatory Visit: Admitting: Internal Medicine

## 2024-09-12 ENCOUNTER — Other Ambulatory Visit: Payer: Self-pay | Admitting: Cardiology

## 2024-10-09 ENCOUNTER — Ambulatory Visit: Admitting: Podiatry

## 2025-02-04 ENCOUNTER — Encounter (HOSPITAL_BASED_OUTPATIENT_CLINIC_OR_DEPARTMENT_OTHER): Admitting: Pulmonary Disease
